# Patient Record
Sex: Female | Born: 1935 | Race: White | Hispanic: No | Smoking: Never smoker
Health system: Southern US, Community
[De-identification: ages and names within clinical notes are randomized; demographics above are authoritative.]

## PROBLEM LIST (undated history)

## (undated) DIAGNOSIS — E079 Disorder of thyroid, unspecified: Secondary | ICD-10-CM

## (undated) DIAGNOSIS — R7303 Prediabetes: Secondary | ICD-10-CM

## (undated) DIAGNOSIS — R29898 Other symptoms and signs involving the musculoskeletal system: Secondary | ICD-10-CM

## (undated) DIAGNOSIS — C9310 Chronic myelomonocytic leukemia not having achieved remission: Secondary | ICD-10-CM

## (undated) DIAGNOSIS — E039 Hypothyroidism, unspecified: Secondary | ICD-10-CM

## (undated) HISTORY — DX: Other symptoms and signs involving the musculoskeletal system: R29.898

## (undated) HISTORY — DX: Chronic myelomonocytic leukemia not having achieved remission: C93.10

---

## 2005-04-30 ENCOUNTER — Ambulatory Visit: Payer: Self-pay | Admitting: Unknown Physician Specialty

## 2005-06-10 ENCOUNTER — Ambulatory Visit: Payer: Self-pay | Admitting: Gastroenterology

## 2006-06-02 ENCOUNTER — Ambulatory Visit: Payer: Self-pay | Admitting: Unknown Physician Specialty

## 2007-06-14 ENCOUNTER — Ambulatory Visit: Payer: Self-pay | Admitting: Unknown Physician Specialty

## 2008-06-20 ENCOUNTER — Ambulatory Visit: Payer: Self-pay | Admitting: Unknown Physician Specialty

## 2009-07-02 ENCOUNTER — Ambulatory Visit: Payer: Self-pay | Admitting: Unknown Physician Specialty

## 2009-07-02 IMAGING — US US CAROTID DUPLEX BILAT
1 series · 17 of 24 positions shown · non-contrast
Comparison: none

REASON FOR EXAM: DIZZINESS  NEAR SYNCOPE
COMMENTS:

[Series 1: us carotid duplex bilat · 17 of 59 slices shown]
[im 1/59]
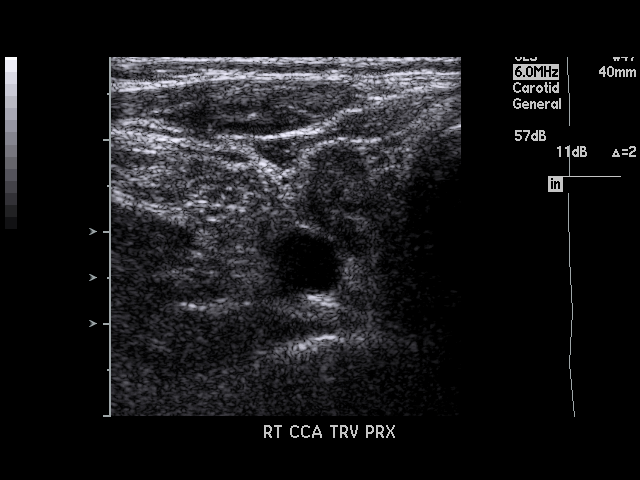
[im 6/59]
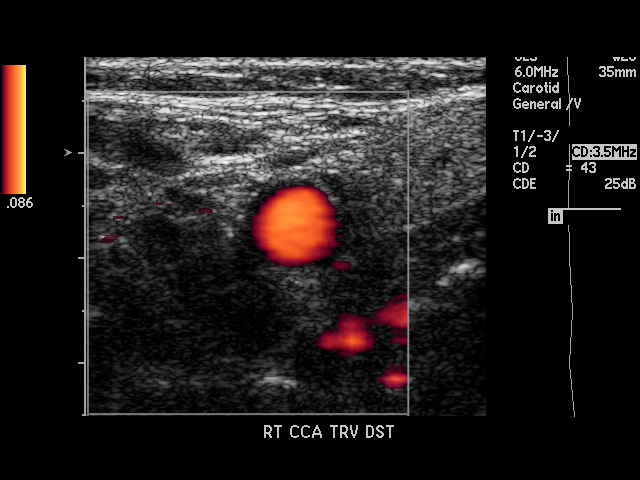
[im 8/59]
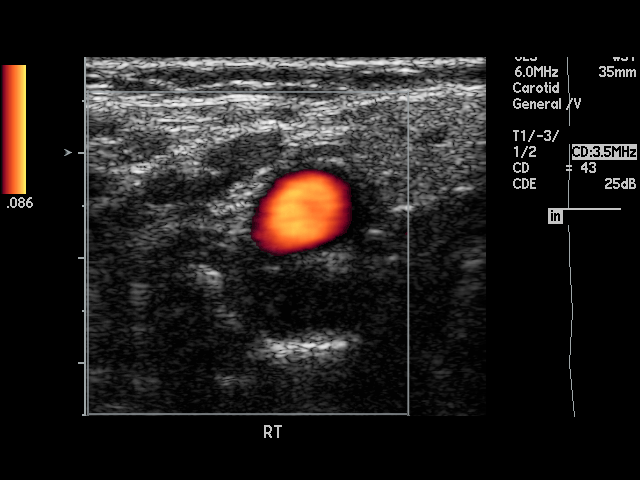
[im 11/59]
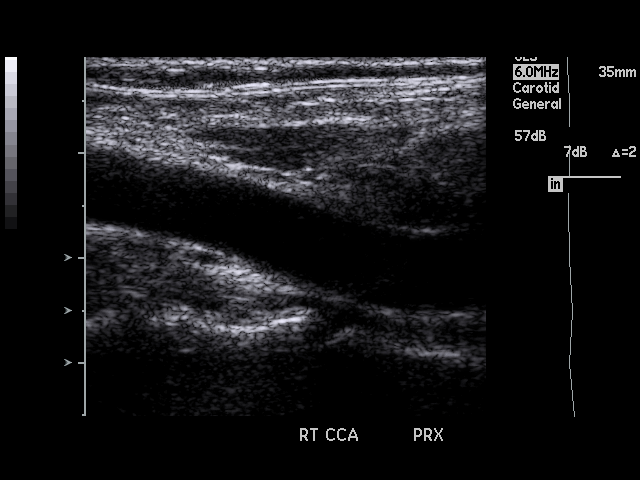
[im 16/59]
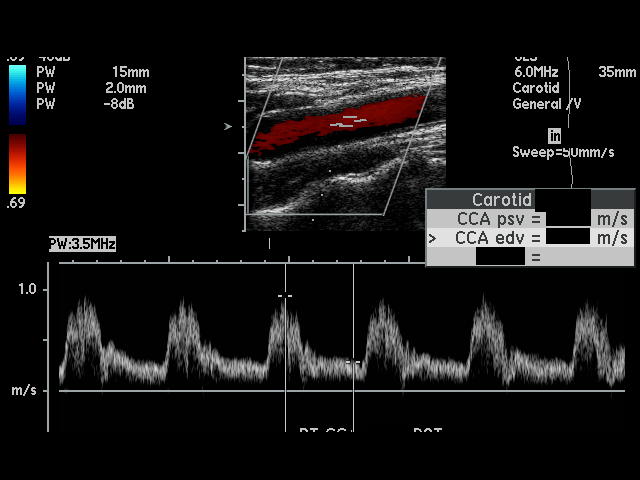
[im 18/59]
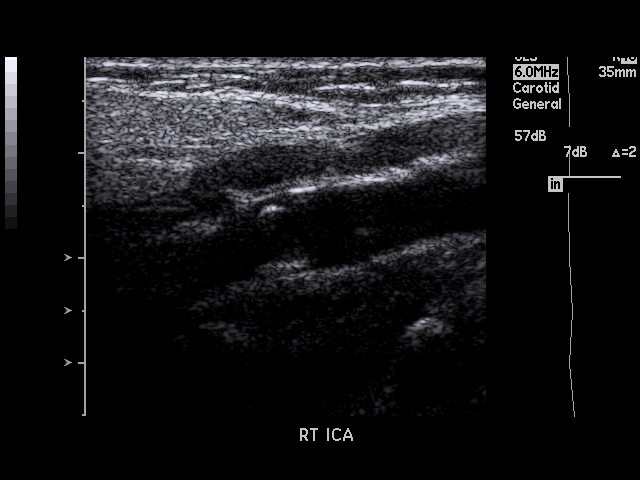
[im 23/59]
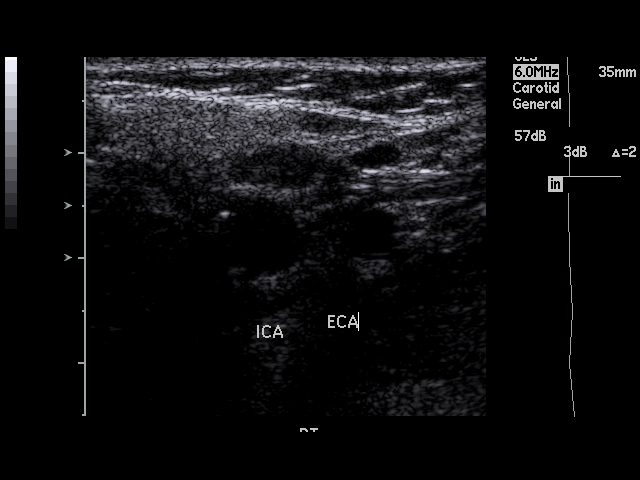
[im 26/59]
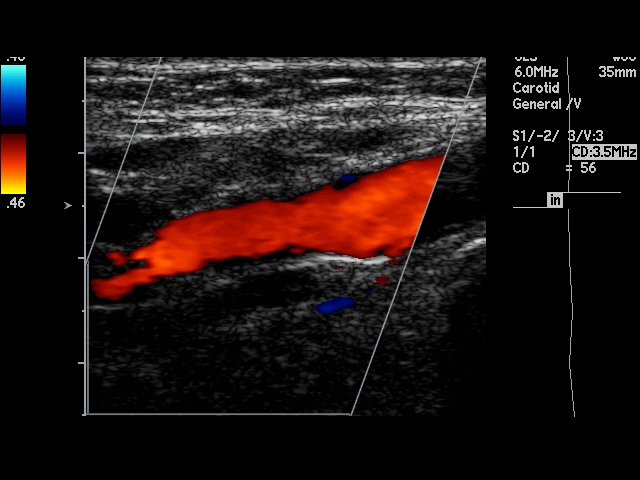
[im 31/59]
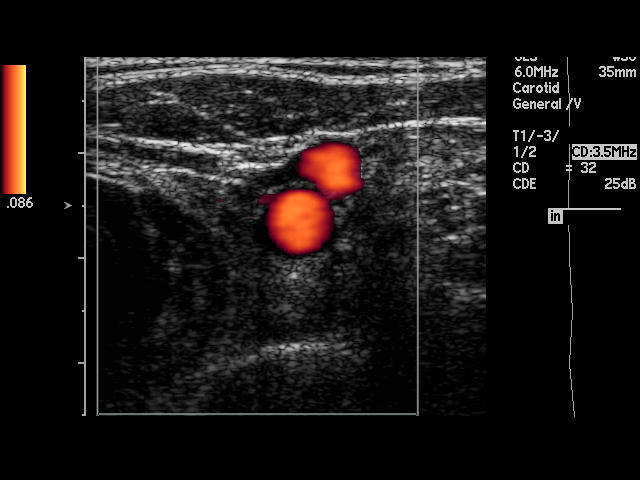
[im 33/59]
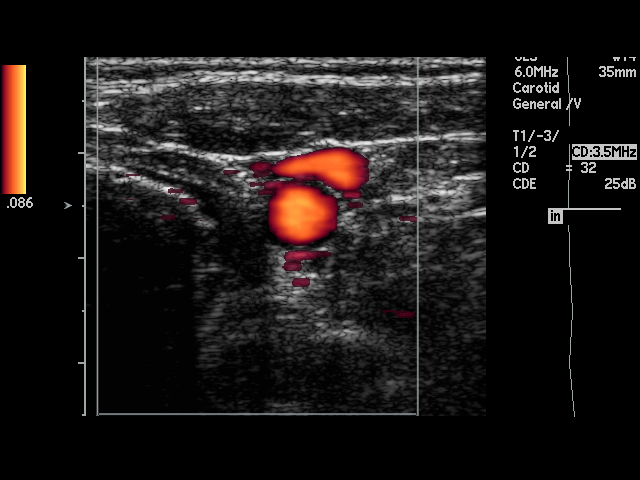
[im 36/59]
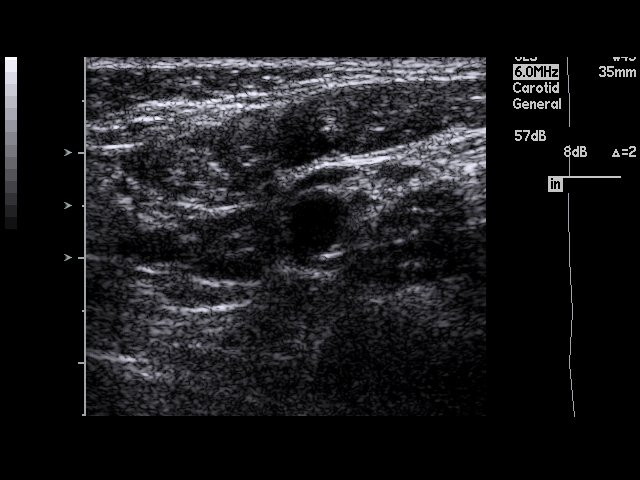
[im 41/59]
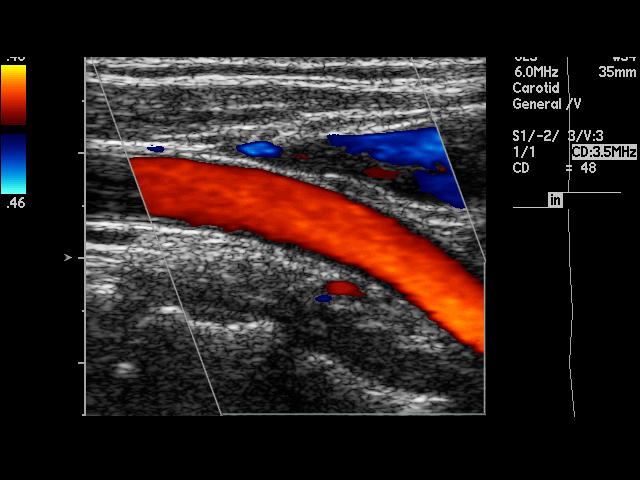
[im 43/59]
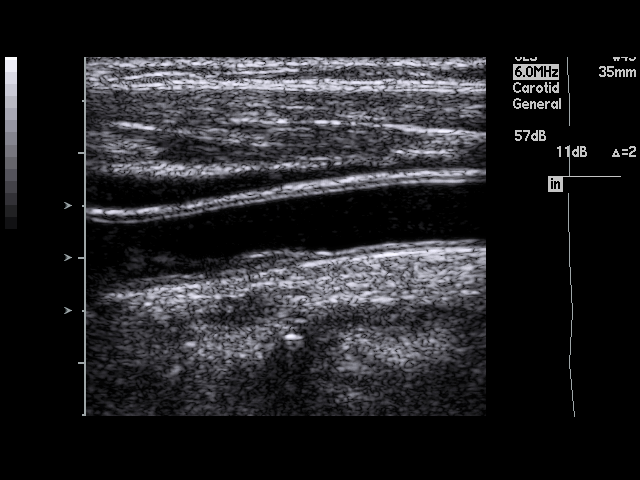
[im 48/59]
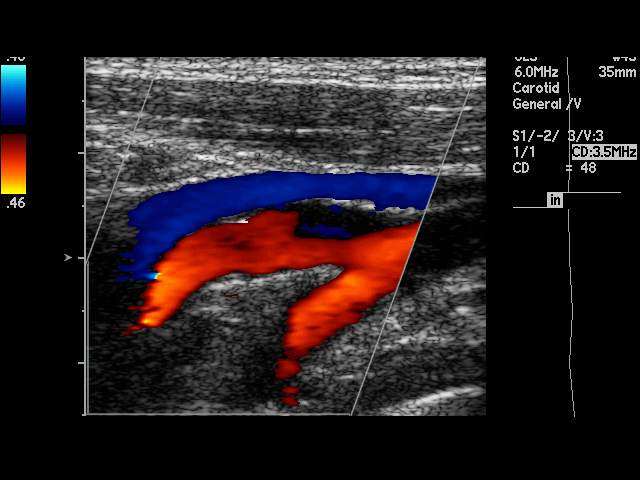
[im 51/59]
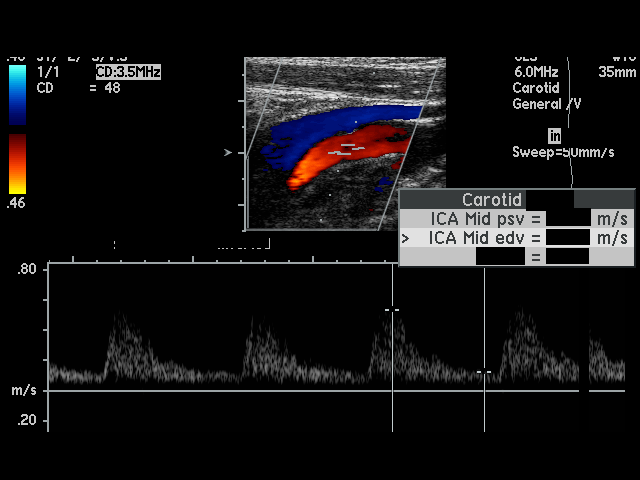
[im 53/59]
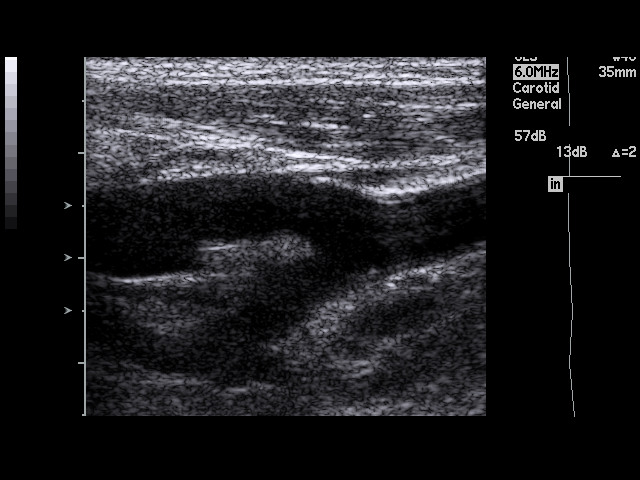
[im 59/59]
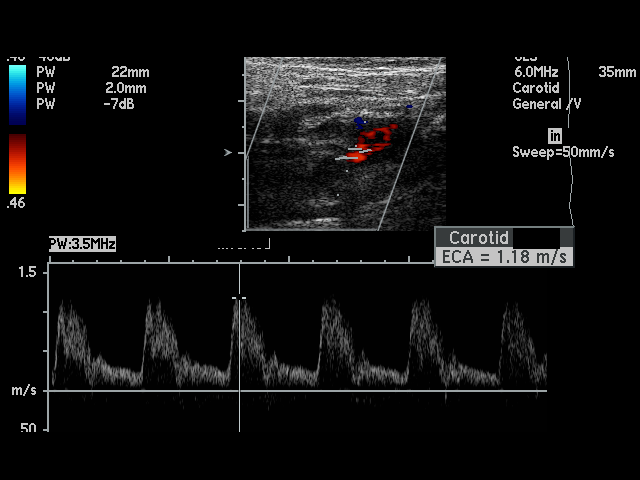

[17 of 24 positions shown; findings below may reference images not displayed]

PROCEDURE:     US  - US CAROTID DOPPLER BILATERAL  - [DATE]  [DATE]

RESULT:     Doppler interrogation of the carotid systems demonstrates the
presence of atherosclerotic plaque in the internal carotid regions
bilaterally and in the left carotid bulb. Visually, there does not appear to
be significant stenosis. The color and SPECTRAL Doppler appearance is within
normal limits. The peak systolic velocities are unremarkable. The internal
to common carotid peak systolic velocity ratio is 1.02 on the right and
on the left. Antegrade flow is seen in both vertebral arteries.
IMPRESSION: 1.     Atherosclerotic disease slightly more prominent on the left. No
evidence of hemodynamically significant stenosis.

## 2009-07-02 IMAGING — CT CT HEAD WITHOUT AND WITH CONTRAST
1 of 2 series · 13 of 30 positions shown, 17 images · IV contrast (agent unspecified)
Comparison: none

REASON FOR EXAM: DIZZINESS  NEAR SYNCOPE
COMMENTS:

PROCEDURE:     CT  - CT HEAD W/WO  - [DATE]  [DATE]
RESULT:
TECHNIQUE: Helical pre- and post contrast 5 mm sections were obtained from
the skull base through the vertex.

[Series 2: without · axial · non-contrast · 0.38mm/px · z∈[+786,+906]mm · 13 of 30 slices shown, 17 images]
[im 3/30  brain]
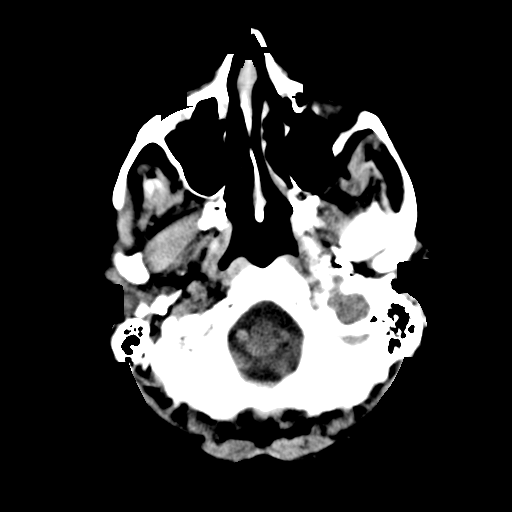
[im 3/30  bone]
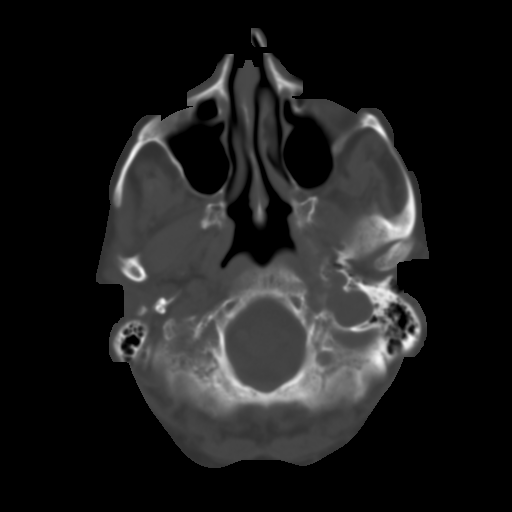
[im 5/30  brain]
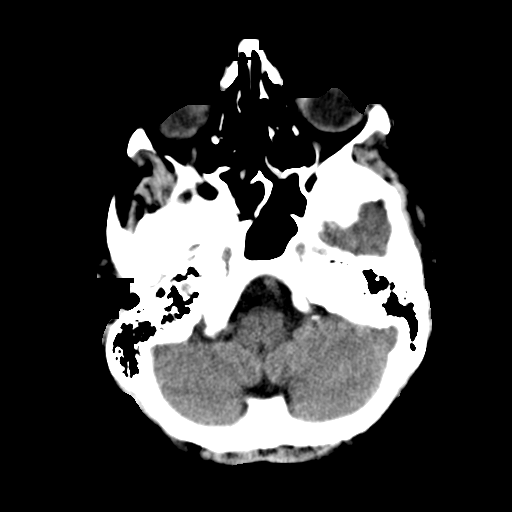
[im 7/30  brain]
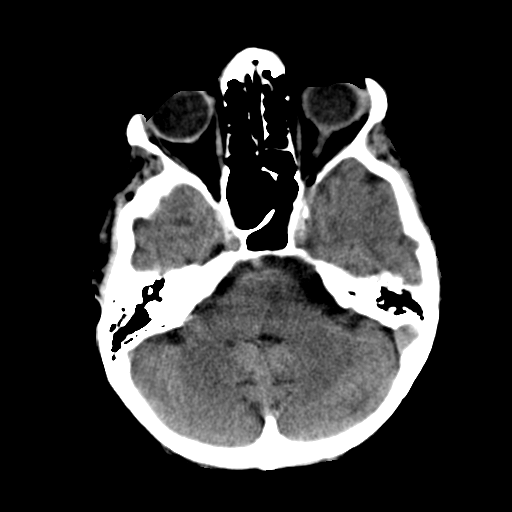
[im 9/30  brain]
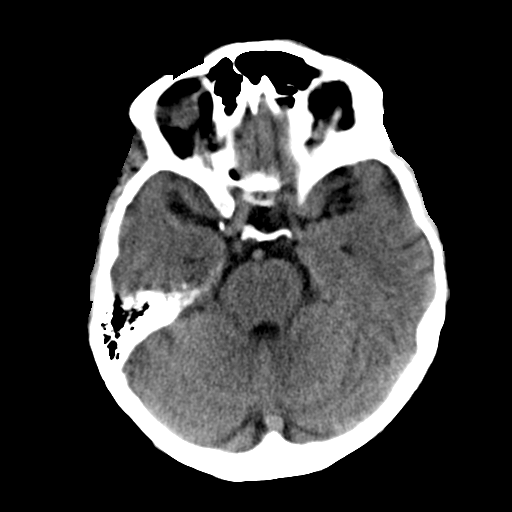
[im 11/30  brain]
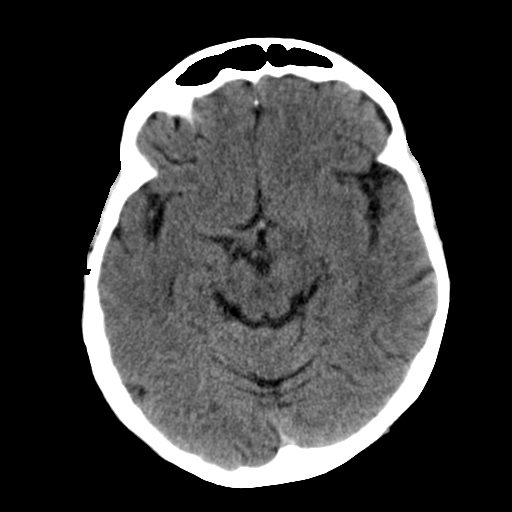
[im 11/30  bone]
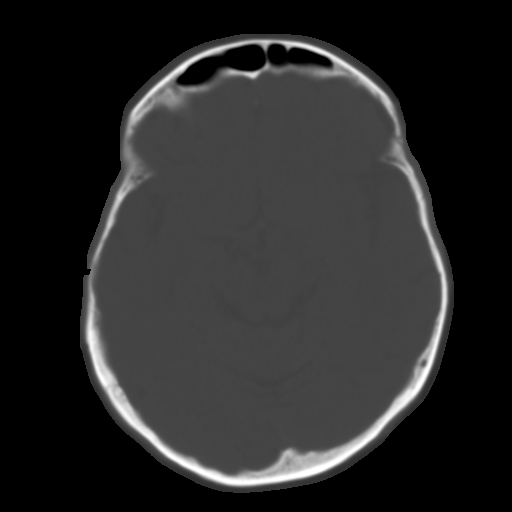
[im 13/30  brain]
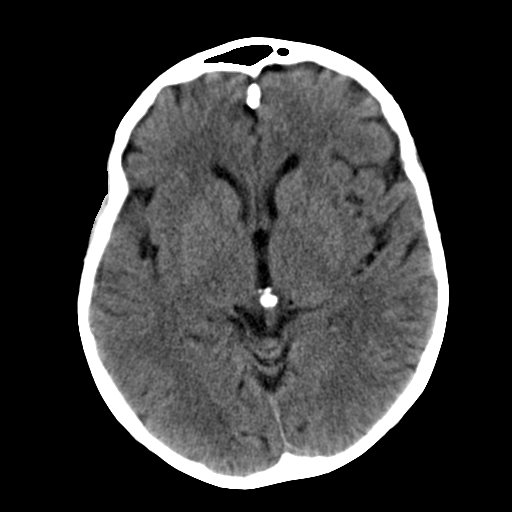
[im 15/30  brain]
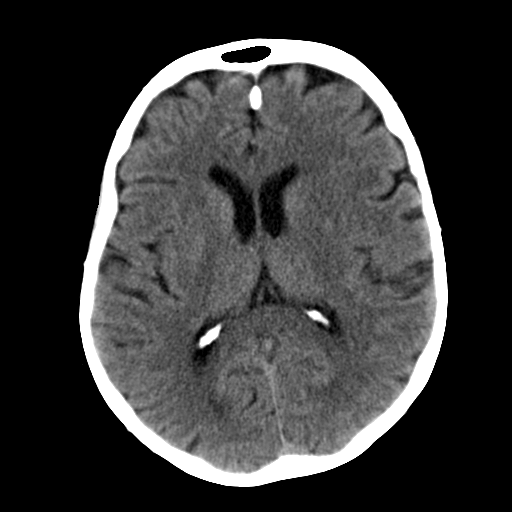
[im 17/30  brain]
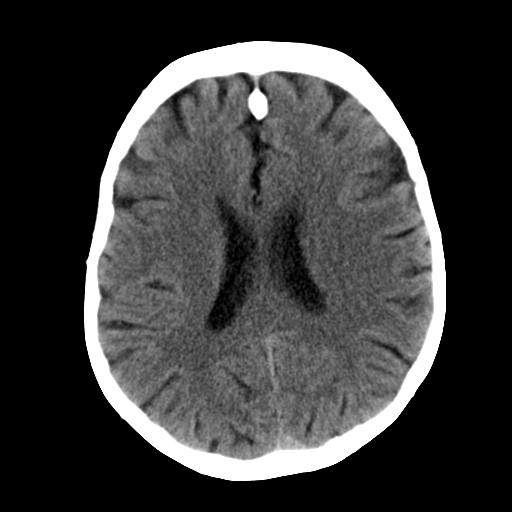
[im 19/30  brain]
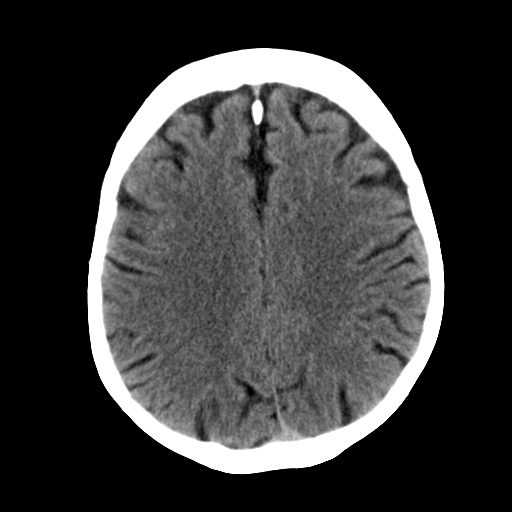
[im 19/30  bone]
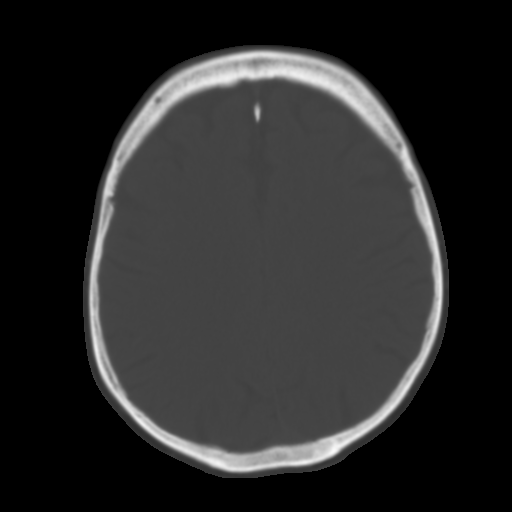
[im 21/30  brain]
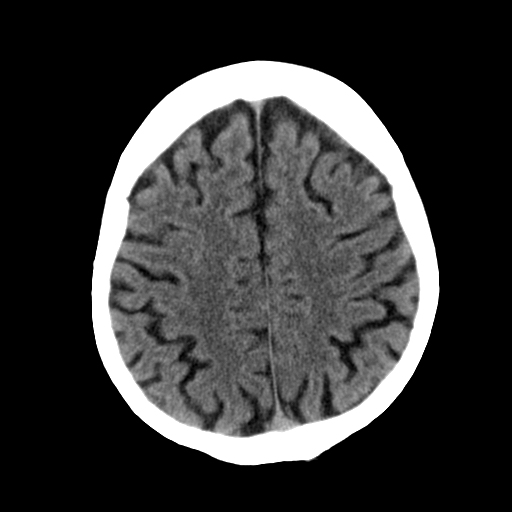
[im 23/30  brain]
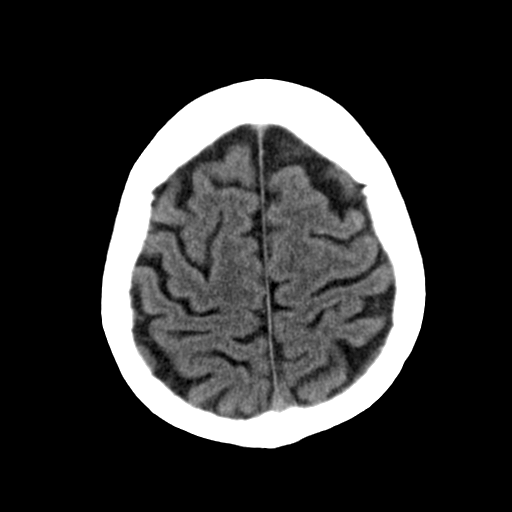
[im 25/30  brain]
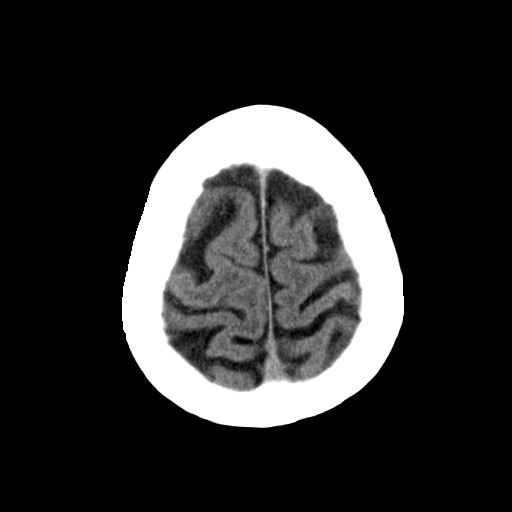
[im 27/30  brain]
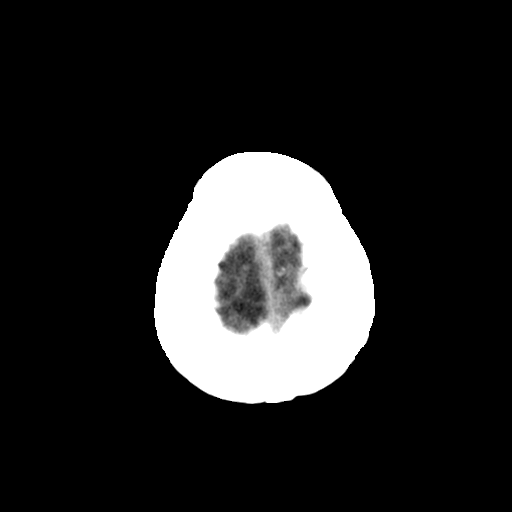
[im 27/30  bone]
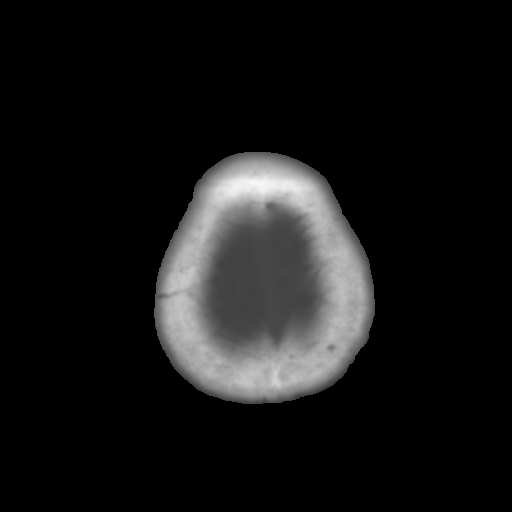

[13 of 30 positions shown; findings below may reference images not displayed]

FINDINGS: There is no evidence of intra-axial nor extra-axial fluid
collections nor evidence of acute hemorrhage. The ventricles and cisterns
are patent. There is no evidence reflecting mass effect. There is mild
diffuse cortical atrophy. There is no evidence of abnormal parenchymal
enhancement nor enhancing masses or nodules. The osseous structures
demonstrate no evidence of a depressed skull fracture. Limited evaluation of
cerebellum and pons is unremarkable.
IMPRESSION: 1. No evidence of focal or acute intracranial abnormalities.
2. It there is persistent clinical concern further evaluation with MRI is
recommended.

## 2009-08-13 ENCOUNTER — Ambulatory Visit: Payer: Self-pay | Admitting: Unknown Physician Specialty

## 2010-08-16 ENCOUNTER — Ambulatory Visit: Payer: Self-pay | Admitting: Unknown Physician Specialty

## 2010-08-19 ENCOUNTER — Ambulatory Visit: Payer: Self-pay | Admitting: Gastroenterology

## 2011-09-08 ENCOUNTER — Ambulatory Visit: Payer: Self-pay | Admitting: Unknown Physician Specialty

## 2012-08-24 ENCOUNTER — Ambulatory Visit: Payer: Self-pay | Admitting: Internal Medicine

## 2012-09-15 ENCOUNTER — Ambulatory Visit: Payer: Self-pay | Admitting: Family Medicine

## 2015-07-26 DIAGNOSIS — R7303 Prediabetes: Secondary | ICD-10-CM | POA: Insufficient documentation

## 2017-09-21 DIAGNOSIS — Z862 Personal history of diseases of the blood and blood-forming organs and certain disorders involving the immune mechanism: Secondary | ICD-10-CM | POA: Insufficient documentation

## 2017-12-03 ENCOUNTER — Emergency Department
Admission: EM | Admit: 2017-12-03 | Discharge: 2017-12-03 | Disposition: A | Discharge status: Home / Self Care | Payer: Medicare HMO | Attending: Emergency Medicine | Admitting: Emergency Medicine

## 2017-12-03 ENCOUNTER — Emergency Department: Payer: Medicare HMO

## 2017-12-03 ENCOUNTER — Other Ambulatory Visit: Payer: Self-pay

## 2017-12-03 DIAGNOSIS — R079 Chest pain, unspecified: Secondary | ICD-10-CM | POA: Diagnosis not present

## 2017-12-03 DIAGNOSIS — Z79899 Other long term (current) drug therapy: Secondary | ICD-10-CM | POA: Diagnosis not present

## 2017-12-03 HISTORY — DX: Disorder of thyroid, unspecified: E07.9

## 2017-12-03 LAB — BASIC METABOLIC PANEL
Anion gap: 9 (ref 5–15)
BUN: 20 mg/dL (ref 6–20)
CHLORIDE: 106 mmol/L (ref 101–111)
CO2: 25 mmol/L (ref 22–32)
CREATININE: 0.74 mg/dL (ref 0.44–1.00)
Calcium: 9.1 mg/dL (ref 8.9–10.3)
GFR calc Af Amer: >60 mL/min (ref >60)
GFR calc non Af Amer: >60 mL/min (ref >60)
GLUCOSE: 98 mg/dL (ref 65–99)
Potassium: 4.1 mmol/L (ref 3.5–5.1)
SODIUM: 140 mmol/L (ref 135–145)

## 2017-12-03 LAB — CBC
HEMATOCRIT: 36.3 % (ref 35.0–47.0)
HEMOGLOBIN: 12.2 g/dL (ref 12.0–16.0)
MCH: 34 pg (ref 26.0–34.0)
MCHC: 33.7 g/dL (ref 32.0–36.0)
MCV: 101.2 fL — AB (ref 80.0–100.0)
Platelets: 215 10*3/uL (ref 150–440)
RBC: 3.59 MIL/uL — ABNORMAL LOW (ref 3.80–5.20)
RDW: 14.1 % (ref 11.5–14.5)
WBC: 4.8 10*3/uL (ref 3.6–11.0)

## 2017-12-03 LAB — TROPONIN I: Troponin I: <0.03 ng/mL (ref <0.03)

## 2017-12-03 IMAGING — CR DG CHEST 2V
1 series · 2 of 2 positions shown · non-contrast
Comparison: None.

CLINICAL DATA: Chest pain.

EXAM:
CHEST - 2 VIEW

[Series 1: dg chest 2 view · 0.14mm/px · 2 of 2 slices shown]
[im 1/2]
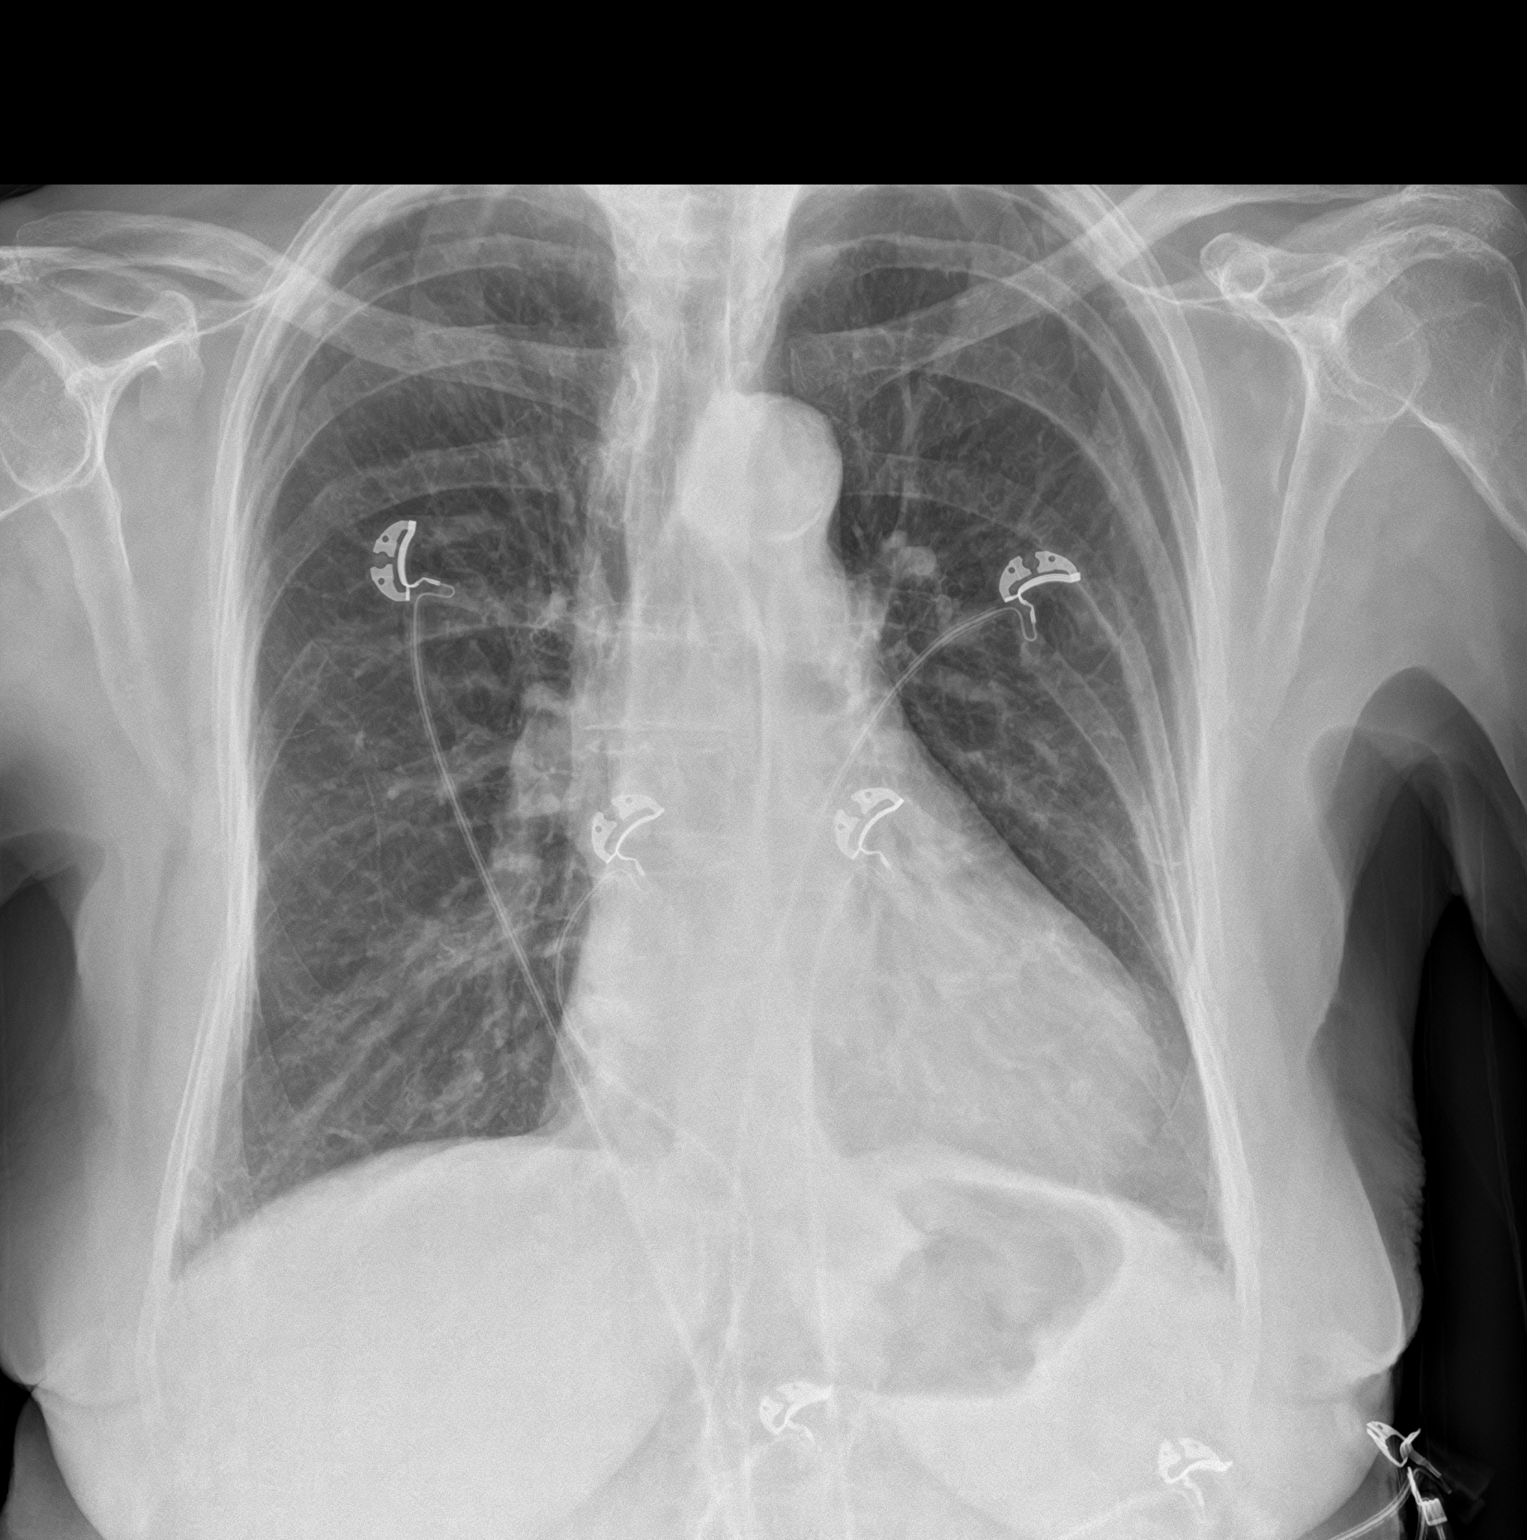
[im 2/2]
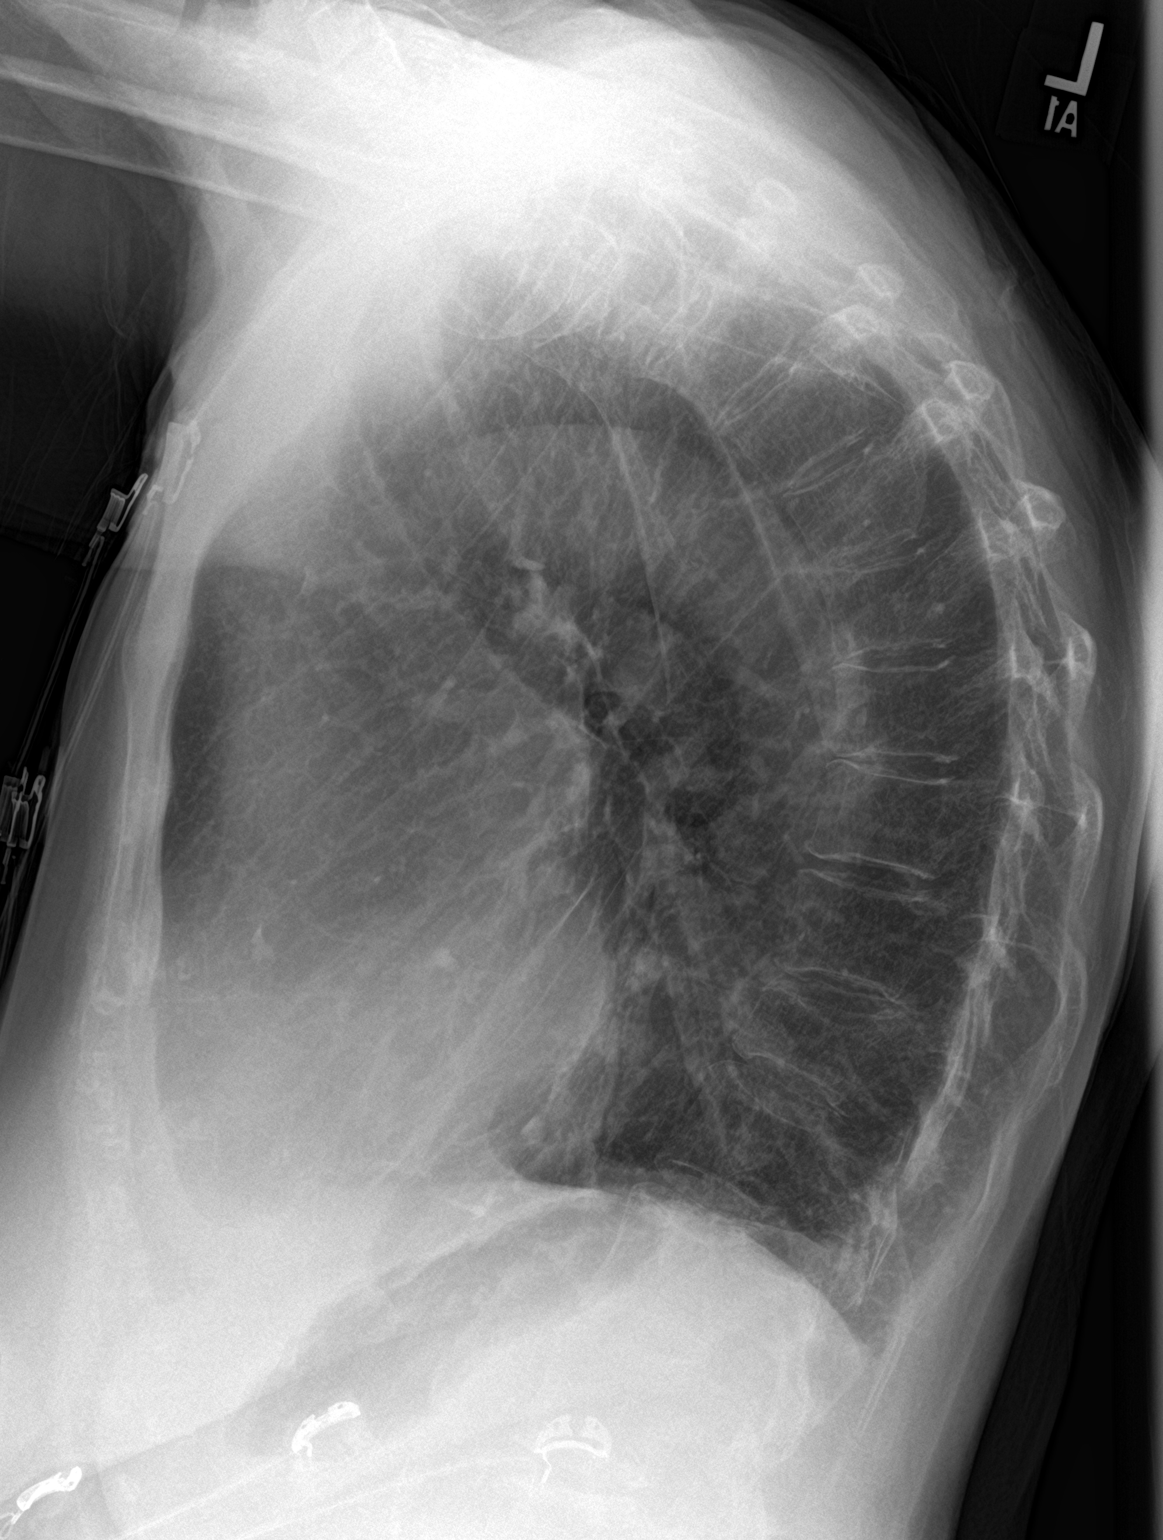

[2 of 2 positions shown; findings below may reference images not displayed]

FINDINGS: Stable cardiomediastinal silhouette. Atherosclerosis of thoracic
aorta is noted. No pneumothorax or pleural effusion is noted. No
acute pulmonary disease is noted. Bony thorax is unremarkable.
Hyperexpansion of the lungs is noted.
IMPRESSION: Hyperexpansion of the lungs is noted suggesting chronic obstructive
pulmonary disease.

Aortic Atherosclerosis ([6E]-[6E]).

## 2017-12-03 NOTE — ED Notes (Signed)
Pt came to nurses station and asked when EDP will be in. RN explained not long but cant give exact time. Pt stated, "I will give him 5 more mins then I am leaving!" RN acknowledged, and explained EDP in another pt room at this time and will let him know.

## 2017-12-03 NOTE — ED Triage Notes (Signed)
Pt c/o "slight" chest pain under the left breast that started yesterday.denies SOB/N/V.Marland Kitchen

## 2017-12-03 NOTE — ED Notes (Signed)
Pt refusing gown and EKG. EDP aware at this time.

## 2017-12-03 NOTE — ED Provider Notes (Signed)
[facility] Emergency Department Provider Note  Time seen: 10:06 AM  I have reviewed the triage vital signs and the nursing notes.   HISTORY  Chief Complaint Chest Pain    HPI Amanda Rose is a 84 y.o. female with a past medical history of hypothyroidism, presents to the emergency department for lower chest/upper abdominal discomfort.  According to the patient since yesterday afternoon she has been experiencing a very mild she rates as a 1/10 discomfort in her upper abdomen/lower chest.  Patient states she has been experiencing some mild left hip pain over the past 2 weeks which was diagnosed as bursitis.  States she has been taking ibuprofen over the past 1 week which she believes is causing her discomfort due to irritation.  She denies any shortness of breath, trouble breathing, nausea or diaphoresis.  Rates the discomfort as mild pressure 1/10.  States she told her family about it who made her come to the emergency department.  Patient states she did not feel she needed to come.   Past Medical History:  Diagnosis Date  . Thyroid disease     There are no active problems to display for this patient.   History reviewed. No pertinent surgical history.  Prior to Admission medications   Medication Sig Start Date End Date Taking? Authorizing Provider  Ferrous Fumarate (HEMOCYTE - 106 MG FE) 324 (106 Fe) MG TABS tablet Take 1 tablet by mouth 3 (three) times a week.   Yes [provider]  lactobacillus acidophilus (BACID) TABS tablet Take 1 capsule by mouth 3 (three) times a week.   Yes [provider]  levothyroxine (SYNTHROID, LEVOTHROID) 88 MCG tablet Take 1 tablet by mouth every morning. 10/19/17  Yes [provider]  magnesium oxide (MAG-OX) 400 MG tablet Take 1 tablet by mouth 3 (three) times a week.   Yes [provider]  meloxicam (MOBIC) 15 MG tablet Take 15 mg by mouth daily with breakfast. 11/19/17  Yes [provider]  Multiple Vitamins-Minerals (CENTRUM SILVER 50+WOMEN) TABS Take 1 tablet by mouth 3 (three) times a week.   Yes [provider]  Multiple Vitamins-Minerals (PRESERVISION AREDS 2+MULTI VIT) CAPS Take 1 capsule by mouth 3 (three) times a week.   Yes [provider]    No Known Allergies  No family history on file.  Social History Social History   Tobacco Use  . Smoking status: Never Smoker  . Smokeless tobacco: Never Used  Substance Use Topics  . Alcohol use: Not on file  . Drug use: Not on file    Review of Systems Constitutional: Negative for fever. Eyes: Negative for visual complaints ENT: Negative for recent illness/congestion Cardiovascular: Positive for lower chest discomfort/upper abdominal discomfort Respiratory: Negative for shortness of breath. Gastrointestinal: Mild upper abdominal/left upper quadrant discomfort/lower chest pain. Genitourinary: Negative for urinary compaints Musculoskeletal: Negative for lower leg pain or swelling.  Patient states she had mild discomfort in the left hip but that is gone since taking ibuprofen for the last week. Skin: Negative for skin complaints  Neurological: Negative for headache All other ROS negative  ____________________________________________   PHYSICAL EXAM:  VITAL SIGNS: ED Triage Vitals  Enc Vitals Group     BP 12/03/17 0850 (!) 145/62     Pulse Rate 12/03/17 0850 91     Resp 12/03/17 0850 16     Temp 12/03/17 0850 97.6 F (36.4 C)     Temp Source 12/03/17 0850 Oral  SpO2 12/03/17 0850 97 %     Weight 12/03/17 0851 130 lb (59 kg)     Height 12/03/17 0851 5\' 1"  (1.549 m)     Head Circumference --      Peak Flow --      Pain Score 12/03/17 0851 2     Pain Loc --      Pain Edu? --      Excl. in [place]? --    Constitutional: Alert and oriented. Well appearing and in no distress. Eyes: Normal exam ENT   Head: Normocephalic and atraumatic.   Mouth/Throat: Mucous membranes  are moist. Cardiovascular: Normal rate, regular rhythm. No murmur Respiratory: Normal respiratory effort without tachypnea nor retractions. Breath sounds are clear  Gastrointestinal: Soft and nontender. No distention.  Musculoskeletal: Nontender with normal range of motion in all extremities. No lower extremity tenderness or edema.  Neurologic:  Normal speech and language. No gross focal neurologic deficits Skin:  Skin is warm, dry and intact.  Psychiatric: Mood and affect are normal.  ____________________________________________    EKG  EKG reviewed and interpreted by myself shows normal sinus rhythm 87 bpm with a narrow QRS, normal axis, normal intervals, no concerning ST changes.  Reassuring EKG.  ____________________________________________    RADIOLOGY  X-ray negative for acute abnormality  ____________________________________________   INITIAL IMPRESSION / ASSESSMENT AND PLAN / ED COURSE  Pertinent labs & imaging results that were available during my care of the patient were reviewed by me and considered in my medical decision making (see chart for details).  Patient presents to the emergency department for lower chest/upper abdominal discomfort which she ranks as a 1/10.  Differential would include ACS, pancreatitis, gastritis, chest wall discomfort.  On exam the patient appears extremely well, no distress, overall normal physical exam, normal heart sounds and lung sounds, nontender abdomen.  EKG is extremely reassuring.  We will obtain an x-ray as well as labs including cardiac enzymes.  Patient denies any discomfort currently, states she does not feel like she needs to be at the emergency department.  X-ray negative for acute abnormality.  EKG is reassuring.  Labs including cardiac enzymes are reassuring.  Patient has no complaints or discomfort currently.  We will discharge patient with cardiology follow-up.  Patient agreeable to this plan of care.  I discussed my normal  chest pain return precautions to which the patient and family are agreeable.  ____________________________________________   FINAL CLINICAL IMPRESSION(S) / ED DIAGNOSES  Chest pain    Harvest Dark, MD 12/03/17 1104

## 2017-12-03 NOTE — ED Notes (Signed)
Pt refused lab draw as well as any monitoring. Pt states, "I'm fine! Do not need to see doctor at all." RN will notify EDP and continue to monitor.

## 2017-12-03 NOTE — Discharge Instructions (Addendum)
You have been seen in the emergency department today for chest pain. Your workup has shown normal results. As we discussed please follow-up with your primary care physician in the next 1-2 days for recheck. Return to the emergency department for any further chest pain, trouble breathing, or any other symptom personally concerning to yourself. °

## 2017-12-07 DIAGNOSIS — I6523 Occlusion and stenosis of bilateral carotid arteries: Secondary | ICD-10-CM | POA: Insufficient documentation

## 2019-04-18 DIAGNOSIS — H35033 Hypertensive retinopathy, bilateral: Secondary | ICD-10-CM | POA: Insufficient documentation

## 2019-04-18 DIAGNOSIS — H353131 Nonexudative age-related macular degeneration, bilateral, early dry stage: Secondary | ICD-10-CM | POA: Insufficient documentation

## 2020-11-01 DIAGNOSIS — N1831 Chronic kidney disease, stage 3a: Secondary | ICD-10-CM | POA: Insufficient documentation

## 2021-01-07 ENCOUNTER — Other Ambulatory Visit: Payer: Self-pay | Admitting: Family Medicine

## 2021-01-07 DIAGNOSIS — R140 Abdominal distension (gaseous): Secondary | ICD-10-CM

## 2021-01-07 DIAGNOSIS — R1030 Lower abdominal pain, unspecified: Secondary | ICD-10-CM

## 2021-01-07 DIAGNOSIS — R0781 Pleurodynia: Secondary | ICD-10-CM

## 2021-01-08 ENCOUNTER — Ambulatory Visit
Admission: RE | Admit: 2021-01-08 | Discharge: 2021-01-08 | Disposition: A | Discharge status: Home / Self Care | Payer: Medicare HMO | Source: Ambulatory Visit | Attending: Family Medicine | Admitting: Family Medicine

## 2021-01-08 ENCOUNTER — Other Ambulatory Visit: Payer: Self-pay

## 2021-01-08 DIAGNOSIS — R0781 Pleurodynia: Secondary | ICD-10-CM | POA: Diagnosis present

## 2021-01-08 DIAGNOSIS — R140 Abdominal distension (gaseous): Secondary | ICD-10-CM | POA: Insufficient documentation

## 2021-01-08 DIAGNOSIS — R1030 Lower abdominal pain, unspecified: Secondary | ICD-10-CM | POA: Diagnosis present

## 2021-01-08 IMAGING — CT CT ABD-PELV W/ CM
2 of 5 series · 16 of 46 positions shown, 18 images · IV contrast (omnipaque)
Comparison: None.

CLINICAL DATA: Left flank and back pain for 1 week

EXAM:
CT ABDOMEN AND PELVIS WITH CONTRAST
TECHNIQUE: Multidetector CT imaging of the abdomen and pelvis was performed
using the standard protocol following bolus administration of
intravenous contrast.
CONTRAST:  85mL OMNIPAQUE IOHEXOL 300 MG/ML  SOLN

[Series 2: abd pelvis 5.00 · axial · 0.66mm/px · z∈[-1528,-1188]mm · 13 of 78 slices shown, 15 images]
[im 5/78  soft-tissue]
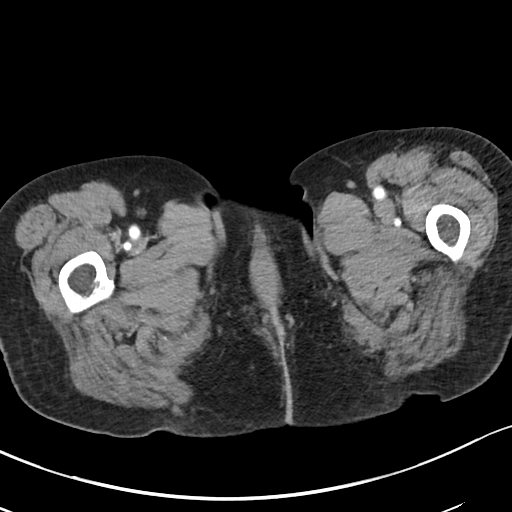
[im 5/78  bone]
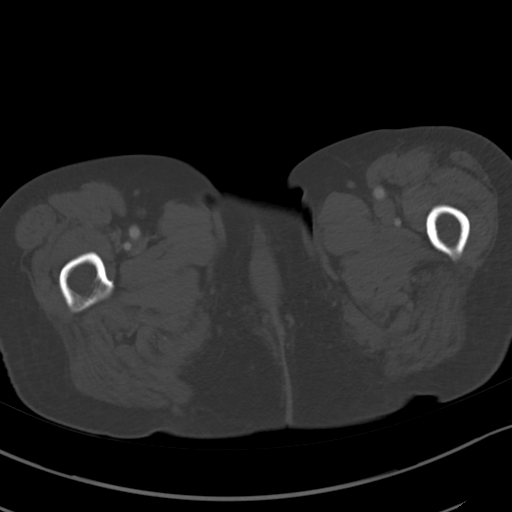
[im 13/78  soft-tissue]
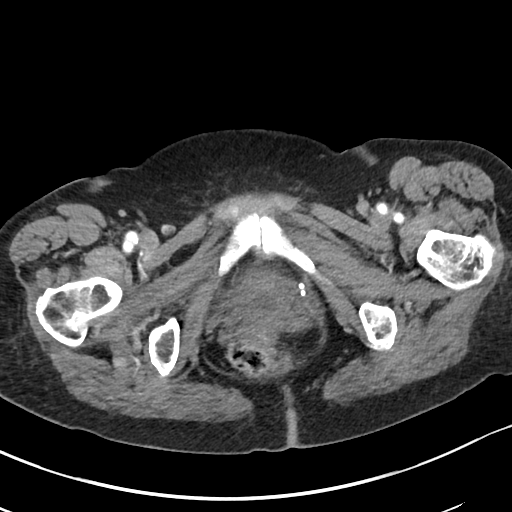
[im 17/78  soft-tissue]
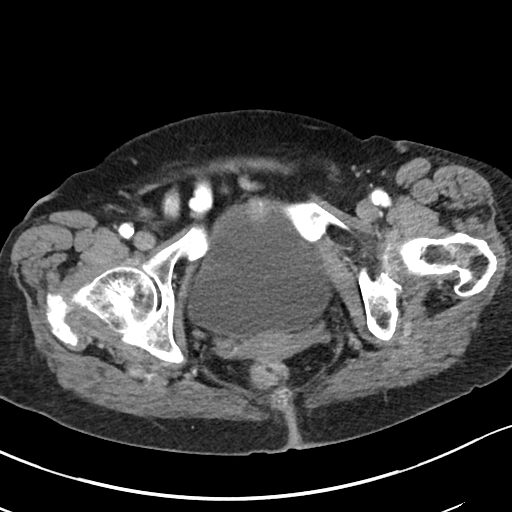
[im 21/78  soft-tissue]
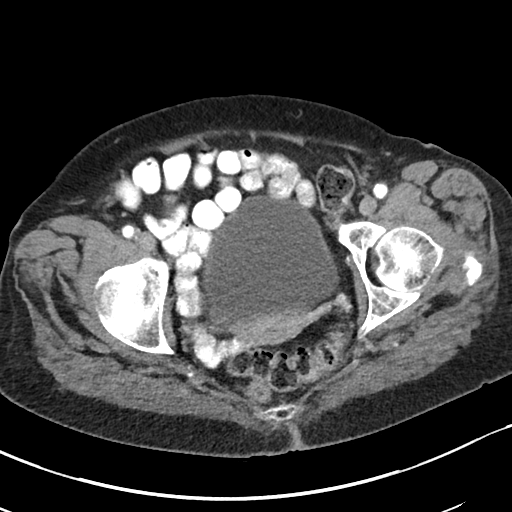
[im 29/78  soft-tissue]
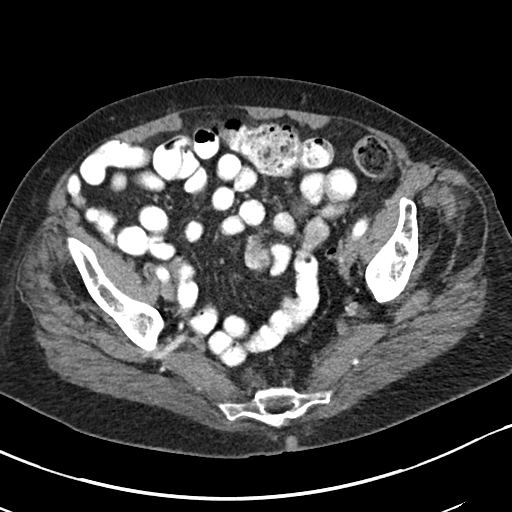
[im 33/78  soft-tissue]
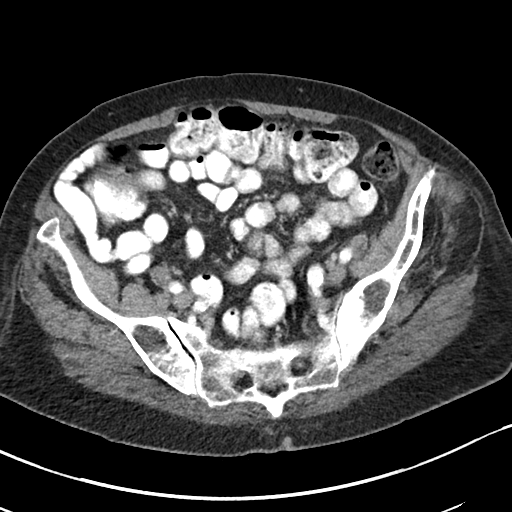
[im 41/78  soft-tissue]
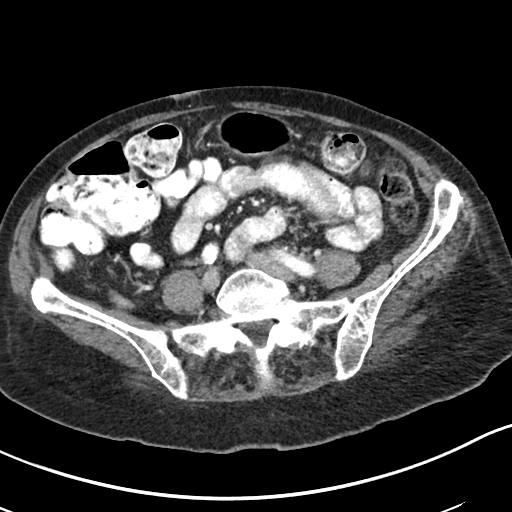
[im 45/78  soft-tissue]
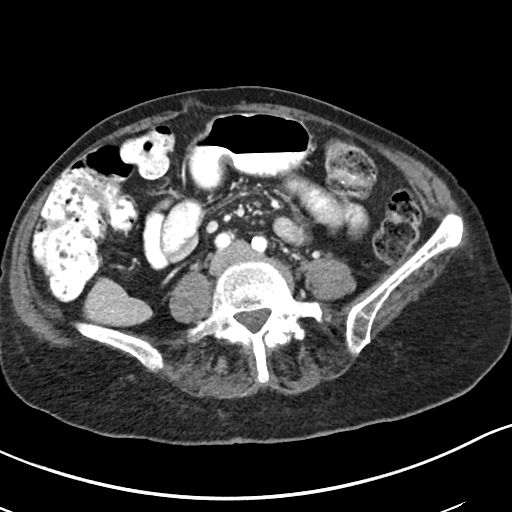
[im 49/78  soft-tissue]
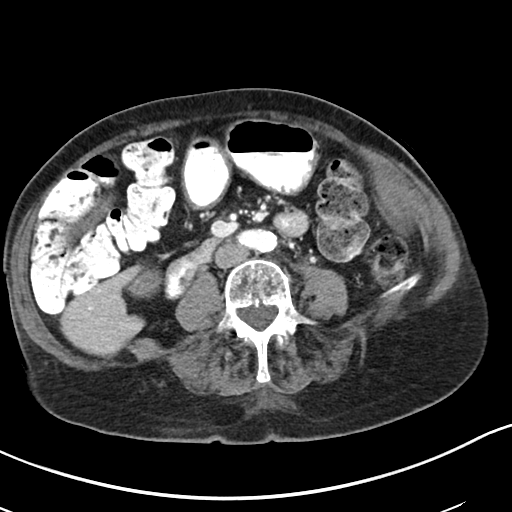
[im 49/78  bone]
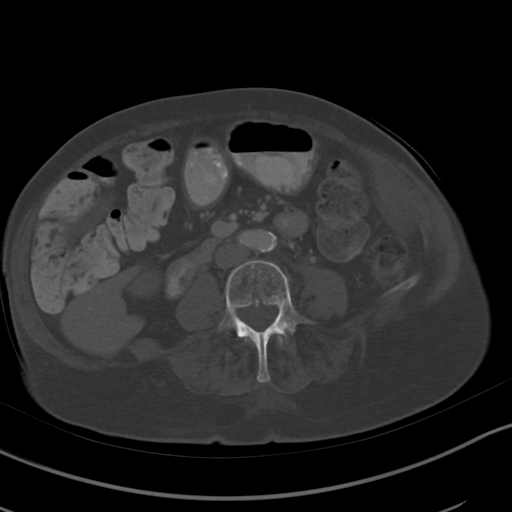
[im 57/78  soft-tissue]
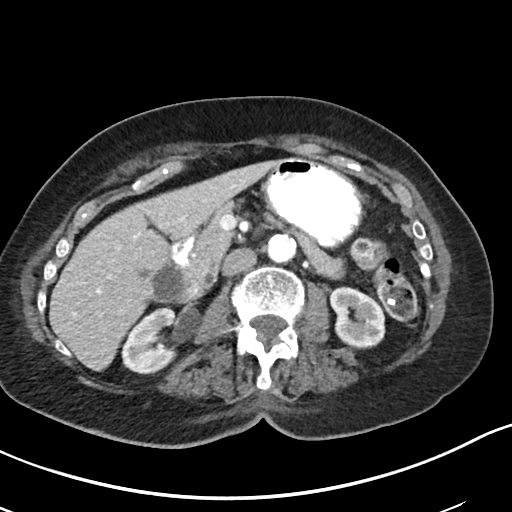
[im 61/78  soft-tissue]
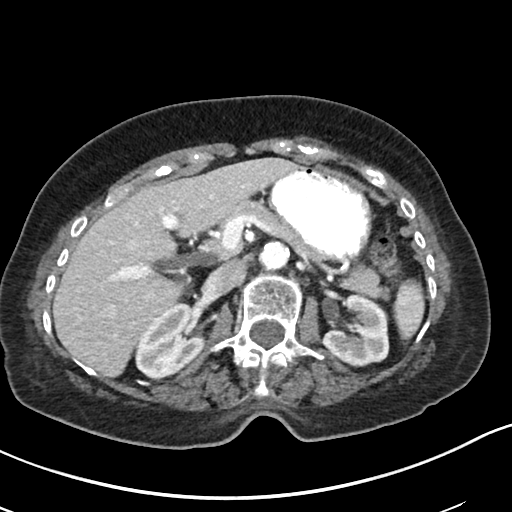
[im 65/78  soft-tissue]
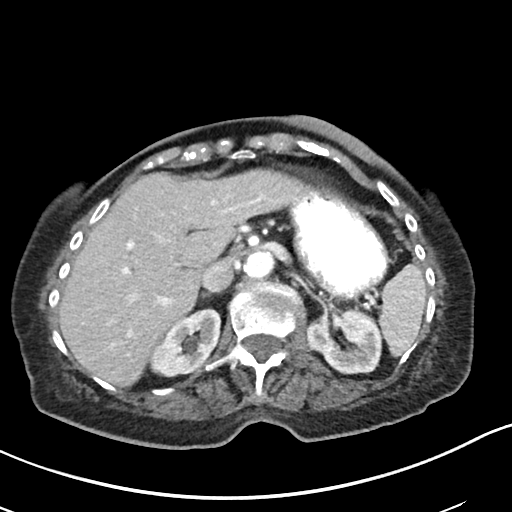
[im 73/78  soft-tissue]
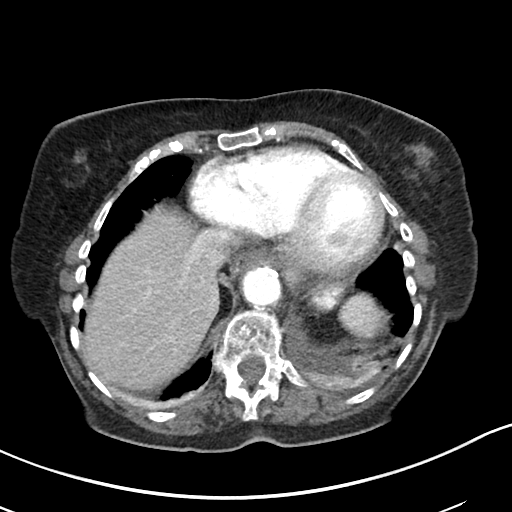

[Series 4: coronals abd pelvis 2.00 cor · coronal · 0.66mm/px · 3 of 130 slices shown]
[im 44/130  soft-tissue]
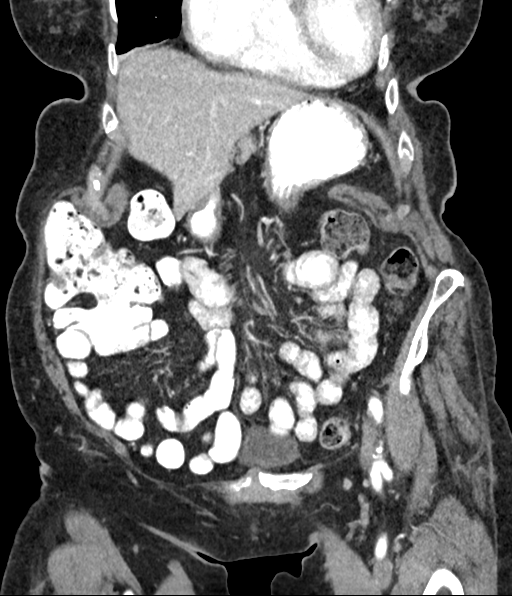
[im 58/130  soft-tissue]
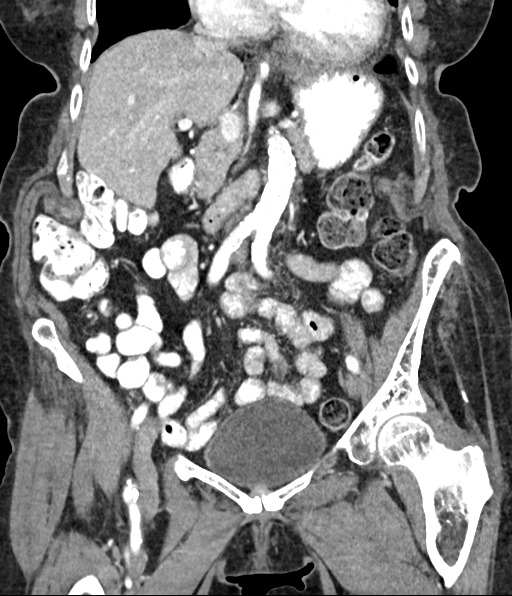
[im 72/130  soft-tissue]
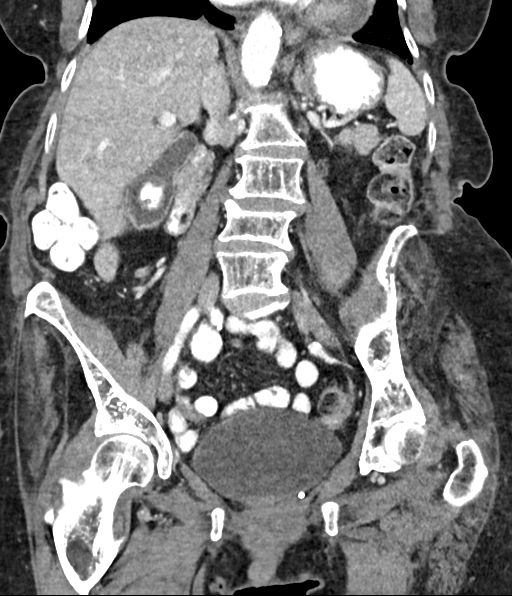

[16 of 46 positions shown; findings below may reference images not displayed]

FINDINGS: Lower chest: Pleural thickening on the left in the lower lobe is
noted with minimal atelectatic change. This is of uncertain
chronicity.

Hepatobiliary: Mild fatty infiltration of the liver is noted.
Gallbladder is well distended with a single dependent gallstone
within.

Pancreas: Unremarkable. No pancreatic ductal dilatation or
surrounding inflammatory changes.

Spleen: Normal in size without focal abnormality.

Adrenals/Urinary Tract: Adrenal glands are within normal limits.
Kidneys demonstrate a normal enhancement pattern bilaterally with
normal excretion bilaterally. No renal calculi are seen. Small
cortical cyst is noted in the lower pole on the right. No
obstructive changes are seen. The ureters are within normal limits.
The bladder is well distended.

Stomach/Bowel: The appendix is not well visualized. No obstructive
or inflammatory changes of the colon are seen. A few scattered
diverticula are noted. Mild retained fecal material in the colon is
noted consistent with a degree of constipation. Stomach and small
bowel appear within normal limits.

Vascular/Lymphatic: Aortic atherosclerosis. No enlarged abdominal or
pelvic lymph nodes.

Reproductive: Uterus and bilateral adnexa are unremarkable.

Other: No abdominal wall hernia or abnormality. No abdominopelvic
ascites.

Musculoskeletal: Schmorl's node is noted in the superior endplate of
L4. No acute compression deformity is noted. Generalized osteopenia
is seen.
IMPRESSION: Mild atelectatic changes in the left base with associated small
effusion and pleural thickening are seen. These changes are felt to
be chronic in nature.

No acute abnormality to correspond with the given clinical history
is noted.

Fatty liver and cholelithiasis.

Diverticulosis without diverticulitis.

## 2021-01-08 MED ORDER — IOHEXOL 300 MG/ML  SOLN
85.0000 mL | Freq: Once | INTRAMUSCULAR | Status: AC | PRN
  Administered 2021-01-08: 85 mL via INTRAVENOUS

## 2021-01-17 ENCOUNTER — Ambulatory Visit: Payer: Medicare HMO

## 2021-03-18 ENCOUNTER — Other Ambulatory Visit: Payer: Self-pay | Admitting: Physician Assistant

## 2021-03-18 DIAGNOSIS — S32040A Wedge compression fracture of fourth lumbar vertebra, initial encounter for closed fracture: Secondary | ICD-10-CM

## 2021-03-20 ENCOUNTER — Ambulatory Visit
Admission: RE | Admit: 2021-03-20 | Discharge: 2021-03-20 | Disposition: A | Discharge status: Home / Self Care | Payer: Medicare HMO | Source: Ambulatory Visit | Attending: Physician Assistant | Admitting: Physician Assistant

## 2021-03-20 ENCOUNTER — Other Ambulatory Visit: Payer: Self-pay

## 2021-03-20 DIAGNOSIS — S32040A Wedge compression fracture of fourth lumbar vertebra, initial encounter for closed fracture: Secondary | ICD-10-CM | POA: Insufficient documentation

## 2021-03-20 IMAGING — MR MR LUMBAR SPINE W/O CM
5 series · 31 of 48 positions shown · non-contrast
Comparison: [DATE] abdominal CT

CLINICAL DATA: Closed wedge fracture of L4

EXAM:
MRI LUMBAR SPINE WITHOUT CONTRAST
TECHNIQUE: Multiplanar, multisequence MR imaging of the lumbar spine was
performed. No intravenous contrast was administered.

[Series 5: T2 · sagittal · 4.0mm · 0.88mm/px · 6 of 18 slices shown (1 of 2)]
[im 1/18]
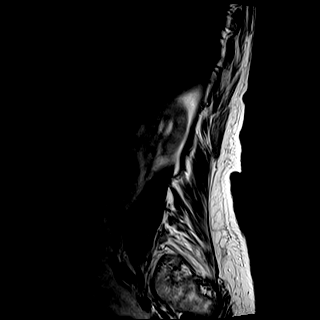
[im 4/18]
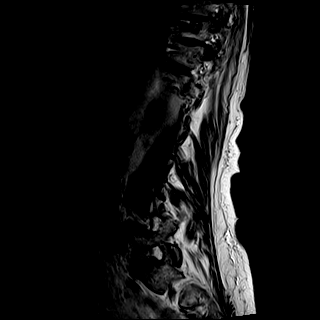
[im 7/18]
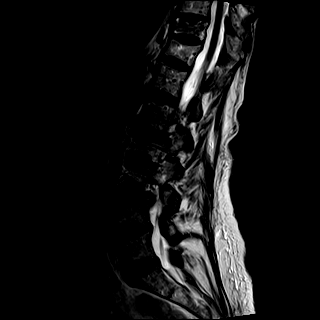
[im 11/18]
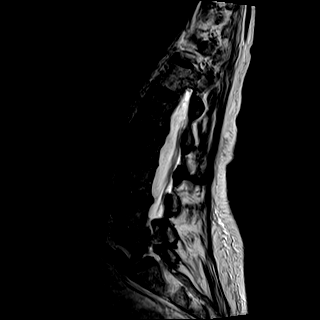
[im 14/18]
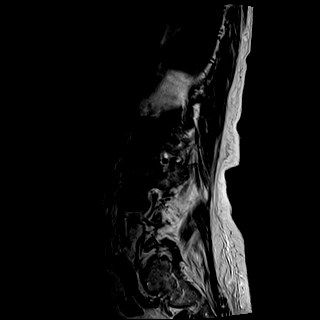
[im 18/18]
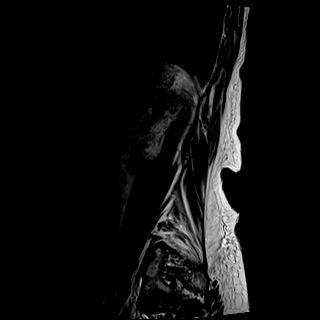

[Series 6: T1 · sagittal · 4.0mm · 0.88mm/px · 7 of 18 slices shown (1 of 2)]
[im 1/18]
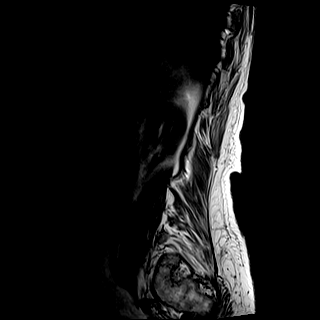
[im 3/18]
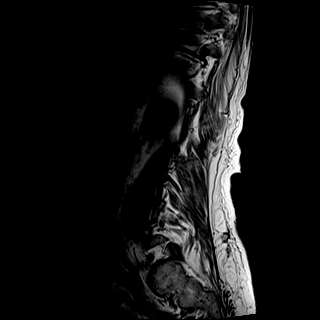
[im 6/18]
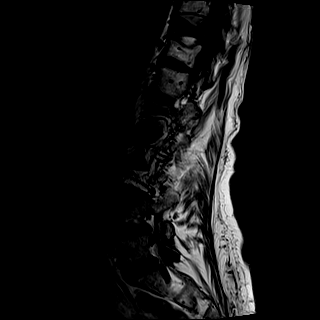
[im 9/18]
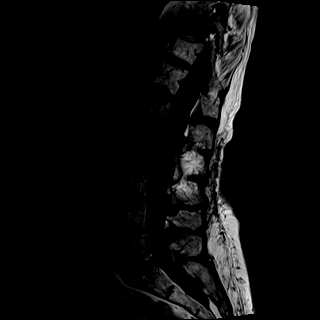
[im 12/18]
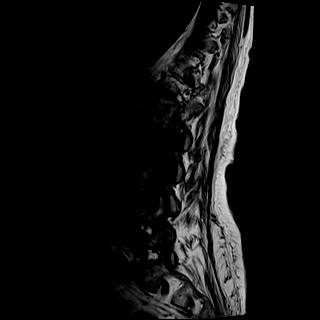
[im 15/18]
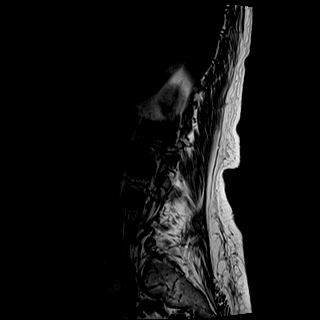
[im 18/18]
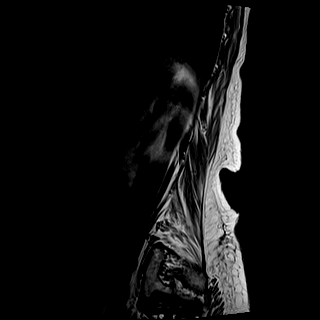

[Series 7: STIR · sagittal · 4.0mm · 0.44mm/px · 2 of 18 slices shown]
[im 1/18]
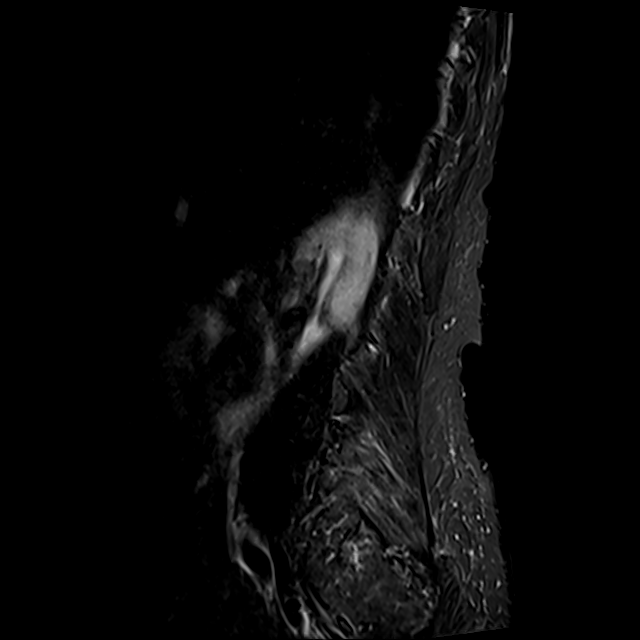
[im 3/18]
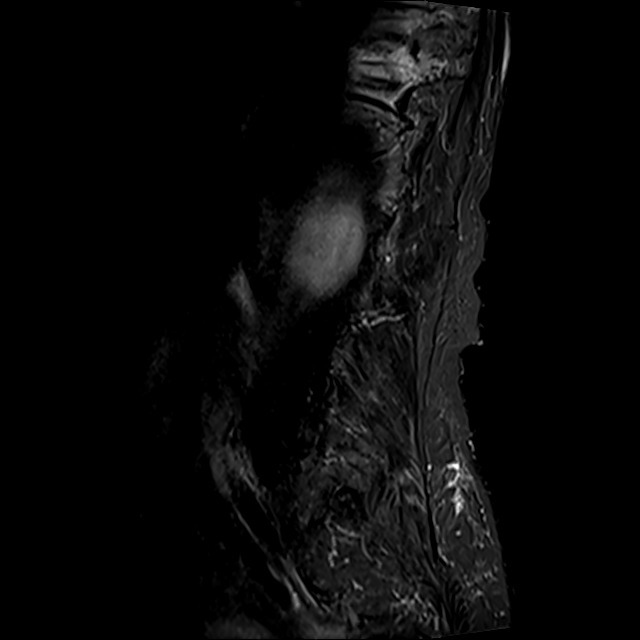

[Series 8: T2 · axial · 4.0mm · 0.78mm/px · z∈[-78,+119]mm · 8 of 36 slices shown (2 of 2)]
[im 1/36]
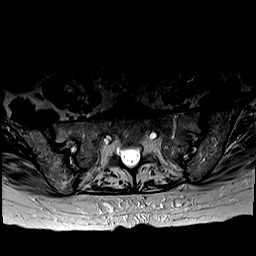
[im 6/36]
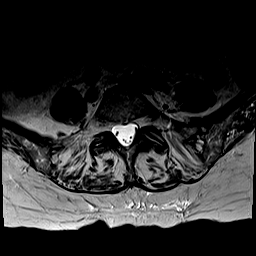
[im 11/36]
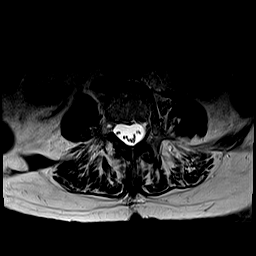
[im 17/36]
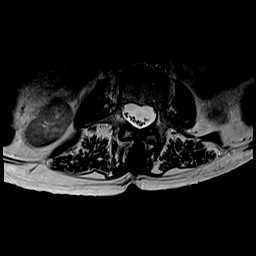
[im 19/36]
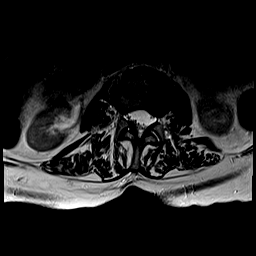
[im 25/36]
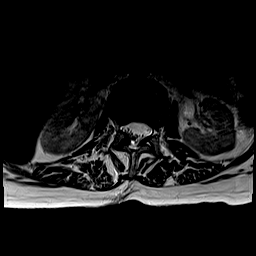
[im 30/36]
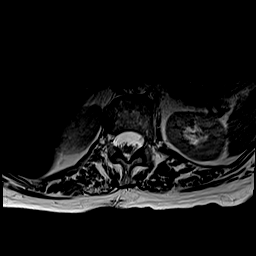
[im 36/36]
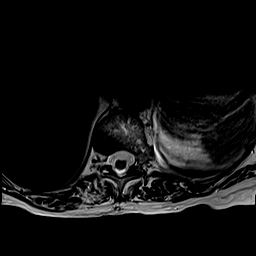

[Series 9: T1 · axial · 4.0mm · 0.39mm/px · z∈[-78,+119]mm · 8 of 36 slices shown (2 of 2)]
[im 1/36]
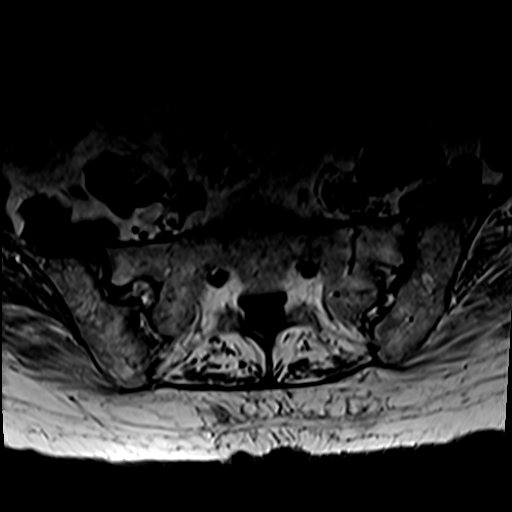
[im 6/36]
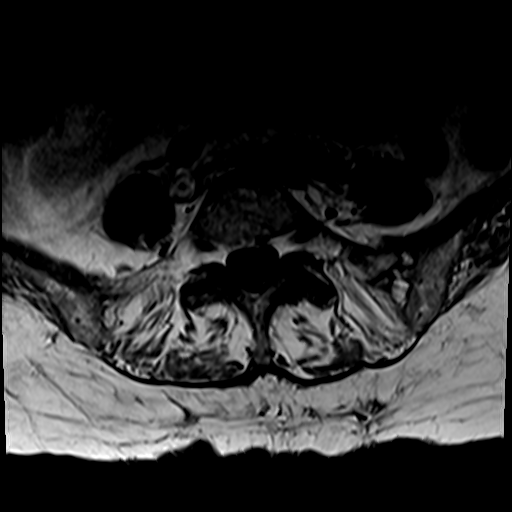
[im 11/36]
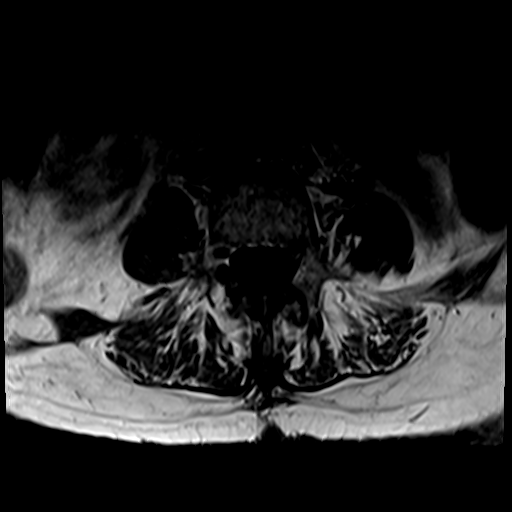
[im 17/36]
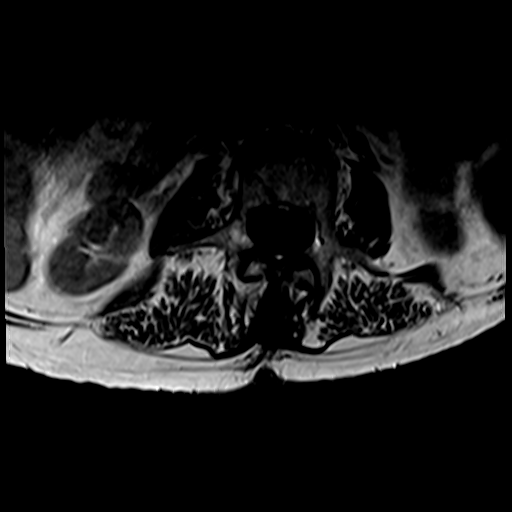
[im 19/36]
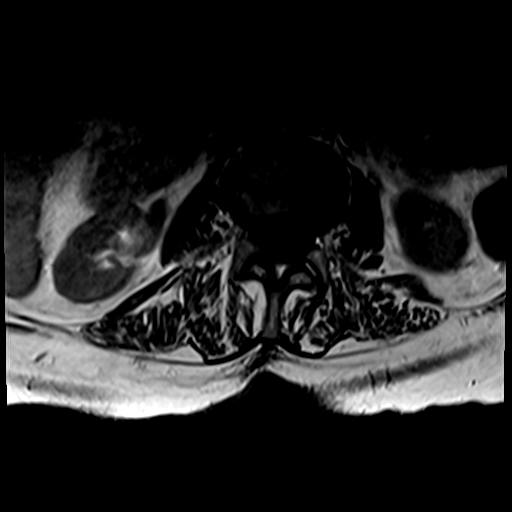
[im 25/36]
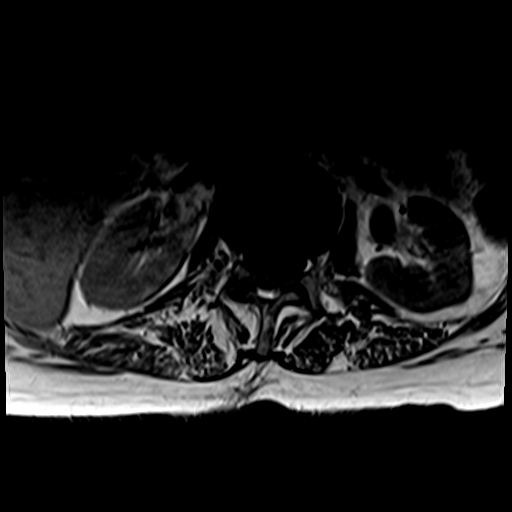
[im 30/36]
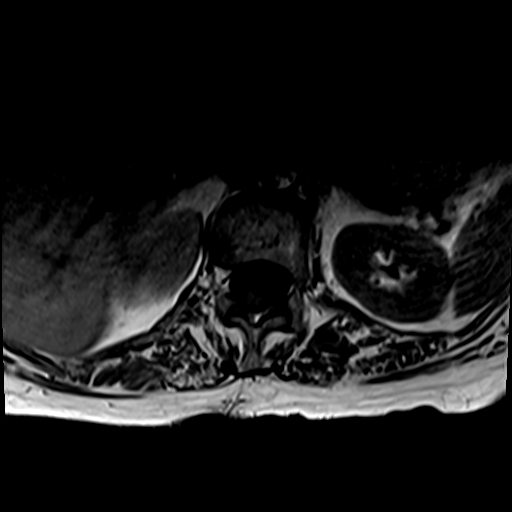
[im 36/36]
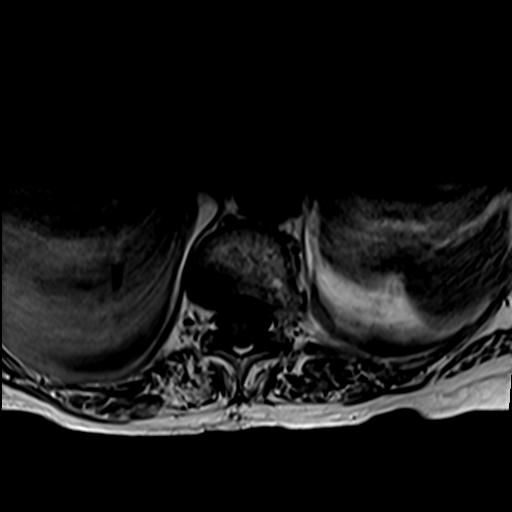

[31 of 48 positions shown; findings below may reference images not displayed]

FINDINGS: Segmentation:  5 lumbar type vertebrae

Alignment:  Mild levoscoliosis

Vertebrae: Marrow edematous signal throughout the L2 body, which is
compressed with a horizontal hypointense fracture plane following
the inferior endplate.

Compression fracture of T10 as well, with diffuse marrow
heterogeneity. Height loss at T10 measures 50% when compared to the
next closest normal vertebra (T12). Remote compression fractures of
T11 and L4. No lytic findings in the L2 body by CT. No evidence of
bone lesion elsewhere.

Conus medullaris and cauda equina: Conus extends to the L1-2 level.
Conus and cauda equina appear normal.

Paraspinal and other soft tissues: Fatty atrophy of intrinsic back
muscles.

Disc levels:

Generalized disc space narrowing and bulging. Generalized
degenerative facet spurring. The canal and foramina are diffusely
patent.
IMPRESSION: 1. Acute to subacute compression fractures at T10 and L2. The T10
vertebra shows greatest height loss at 50%.
2. Prominent degree of marrow involvement, although no visible soft
tissue mass or destructive changes to strongly suggest pathologic
fracture. Consider follow-up to document normalization, especially
if there is malignancy history.
3. Remote compression fractures of T11 and L4.

## 2021-04-03 ENCOUNTER — Other Ambulatory Visit: Payer: Self-pay | Admitting: Orthopedic Surgery

## 2021-04-04 ENCOUNTER — Other Ambulatory Visit: Payer: Self-pay

## 2021-04-04 ENCOUNTER — Other Ambulatory Visit
Admission: RE | Admit: 2021-04-04 | Discharge: 2021-04-04 | Disposition: A | Discharge status: Home / Self Care | Payer: Medicare HMO | Source: Ambulatory Visit | Attending: Orthopedic Surgery | Admitting: Orthopedic Surgery

## 2021-04-04 ENCOUNTER — Encounter
Admission: RE | Admit: 2021-04-04 | Discharge: 2021-04-04 | Disposition: A | Discharge status: Home / Self Care | Payer: Medicare HMO | Source: Ambulatory Visit | Attending: Orthopedic Surgery | Admitting: Orthopedic Surgery

## 2021-04-04 DIAGNOSIS — Z01818 Encounter for other preprocedural examination: Secondary | ICD-10-CM | POA: Diagnosis not present

## 2021-04-04 HISTORY — DX: Prediabetes: R73.03

## 2021-04-04 HISTORY — DX: Hypothyroidism, unspecified: E03.9

## 2021-04-04 LAB — CBC
HCT: 29.4 % — ABNORMAL LOW (ref 36.0–46.0)
Hemoglobin: 10 g/dL — ABNORMAL LOW (ref 12.0–15.0)
MCH: 36.4 pg — ABNORMAL HIGH (ref 26.0–34.0)
MCHC: 34 g/dL (ref 30.0–36.0)
MCV: 106.9 fL — ABNORMAL HIGH (ref 80.0–100.0)
Platelets: 164 10*3/uL (ref 150–400)
RBC: 2.75 MIL/uL — ABNORMAL LOW (ref 3.87–5.11)
RDW: 14.3 % (ref 11.5–15.5)
WBC: 11.2 10*3/uL — ABNORMAL HIGH (ref 4.0–10.5)
nRBC: 0 % (ref 0.0–0.2)

## 2021-04-04 NOTE — Patient Instructions (Signed)
Your procedure is scheduled on: 04/05/21 Report to the Registration Desk on the 1st floor of the [facility]. To find out your arrival time, please call [phone] between 1PM - 3PM on: 04/04/21   REMEMBER: Instructions that are not followed completely may result in serious medical risk, up to and including death; or upon the discretion of your surgeon and anesthesiologist your surgery may need to be rescheduled.  Do not eat food after midnight the night before surgery.  No gum chewing, lozengers or hard candies.  You may however, drink CLEAR liquids up to 2 hours before you are scheduled to arrive for your surgery. Do not drink anything within 2 hours of your scheduled arrival time.  Clear liquids include: - water  - apple juice without pulp - gatorade (not RED, PURPLE, OR BLUE) - black coffee or tea (Do NOT add milk or creamers to the coffee or tea) Do NOT drink anything that is not on this list.  TAKE THESE MEDICATIONS THE MORNING OF SURGERY WITH A SIP OF WATER:  - levothyroxine (SYNTHROID) 100 MCG tablet  One week prior to surgery: Stop Anti-inflammatories (NSAIDS) such as Advil, Aleve, Ibuprofen, Motrin, Naproxen, Naprosyn and Aspirin based products such as Excedrin, Goodys Powder, BC Powder.  Stop ANY OVER THE COUNTER supplements until after surgery.  You may however, continue to take Tylenol if needed for pain up until the day of surgery.  No Alcohol for 24 hours before or after surgery.  No Smoking including e-cigarettes for 24 hours prior to surgery.  No chewable tobacco products for at least 6 hours prior to surgery.  No nicotine patches on the day of surgery.  Do not use any "recreational" drugs for at least a week prior to your surgery.  Please be advised that the combination of cocaine and anesthesia may have negative outcomes, up to and including death. If you test positive for cocaine, your surgery will be cancelled.  On the morning of surgery brush your  teeth with toothpaste and water, you may rinse your mouth with mouthwash if you wish. Do not swallow any toothpaste or mouthwash.  Do not wear jewelry, make-up, hairpins, clips or nail polish.  Do not wear lotions, powders, or perfumes.   Do not shave body from the neck down 48 hours prior to surgery just in case you cut yourself which could leave a site for infection.  Also, freshly shaved skin may become irritated if using the CHG soap.  Contact lenses, hearing aids and dentures may not be worn into surgery.  Do not bring valuables to the hospital. [facility] is not responsible for any missing/lost belongings or valuables.   Notify your doctor if there is any change in your medical condition (cold, fever, infection).  Wear comfortable clothing (specific to your surgery type) to the hospital.  After surgery, you can help prevent lung complications by doing breathing exercises.  Take deep breaths and cough every 1-2 hours. Your doctor may order a device called an Incentive Spirometer to help you take deep breaths. When coughing or sneezing, hold a pillow firmly against your incision with both hands. This is called "splinting." Doing this helps protect your incision. It also decreases belly discomfort.  If you are being admitted to the hospital overnight, leave your suitcase in the car. After surgery it may be brought to your room.  If you are being discharged the day of surgery, you will not be allowed to drive home. You will need a responsible  adult (18 years or older) to drive you home and stay with you that night.   If you are taking public transportation, you will need to have a responsible adult (18 years or older) with you. Please confirm with your physician that it is acceptable to use public transportation.   Please call the [facility] at [phone] if you have any questions about these instructions.  Surgery Visitation Policy:  Patients undergoing a  surgery or procedure may have one family member or support person with them as long as that person is not COVID-19 positive or experiencing its symptoms.  That person may remain in the waiting area during the procedure.  Inpatient Visitation:    Visiting hours are 7 a.m. to 8 p.m. Inpatients will be allowed two visitors daily. The visitors may change each day during the patient's stay. No visitors under the age of 7. Any visitor under the age of 15 must be accompanied by an adult. The visitor must pass COVID-19 screenings, use hand sanitizer when entering and exiting the patient's room and wear a mask at all times, including in the patient's room. Patients must also wear a mask when staff or their visitor are in the room. Masking is required regardless of vaccination status.

## 2021-04-05 ENCOUNTER — Ambulatory Visit
Admission: RE | Admit: 2021-04-05 | Discharge: 2021-04-05 | Disposition: A | Discharge status: Home / Self Care | Payer: Medicare HMO | Attending: Orthopedic Surgery | Admitting: Orthopedic Surgery

## 2021-04-05 ENCOUNTER — Ambulatory Visit: Payer: Medicare HMO

## 2021-04-05 ENCOUNTER — Encounter: Payer: Self-pay | Admitting: Orthopedic Surgery

## 2021-04-05 ENCOUNTER — Other Ambulatory Visit: Payer: Medicare HMO

## 2021-04-05 ENCOUNTER — Encounter
Admission: RE | Disposition: A | Discharge status: Home / Self Care | Payer: Self-pay | Source: Home / Self Care | Attending: Orthopedic Surgery

## 2021-04-05 DIAGNOSIS — Z419 Encounter for procedure for purposes other than remedying health state, unspecified: Secondary | ICD-10-CM

## 2021-04-05 DIAGNOSIS — M4854XA Collapsed vertebra, not elsewhere classified, thoracic region, initial encounter for fracture: Secondary | ICD-10-CM | POA: Diagnosis not present

## 2021-04-05 DIAGNOSIS — M4856XA Collapsed vertebra, not elsewhere classified, lumbar region, initial encounter for fracture: Secondary | ICD-10-CM | POA: Diagnosis present

## 2021-04-05 DIAGNOSIS — Z881 Allergy status to other antibiotic agents status: Secondary | ICD-10-CM | POA: Diagnosis not present

## 2021-04-05 DIAGNOSIS — Z79890 Hormone replacement therapy (postmenopausal): Secondary | ICD-10-CM | POA: Insufficient documentation

## 2021-04-05 HISTORY — PX: KYPHOPLASTY: SHX5884

## 2021-04-05 IMAGING — RF DG LUMBAR SPINE 2-3V
3 series · 3 of 3 positions shown · non-contrast
Comparison: Lumbar spine MRI [DATE].

CLINICAL DATA: Surgery, elective [VP] ([VP]-CM). Additional
history provided: T10, L2 kyphoplasty. Provided fluoroscopy time: 1
minutes, 50 seconds.

EXAM:
LUMBAR SPINE - 2-3 VIEW; DG C-ARM 1-60 MIN

[Series 1: dg x-ray · 0.20mm/px · 1 of 1 slices shown]
[im 1/1]
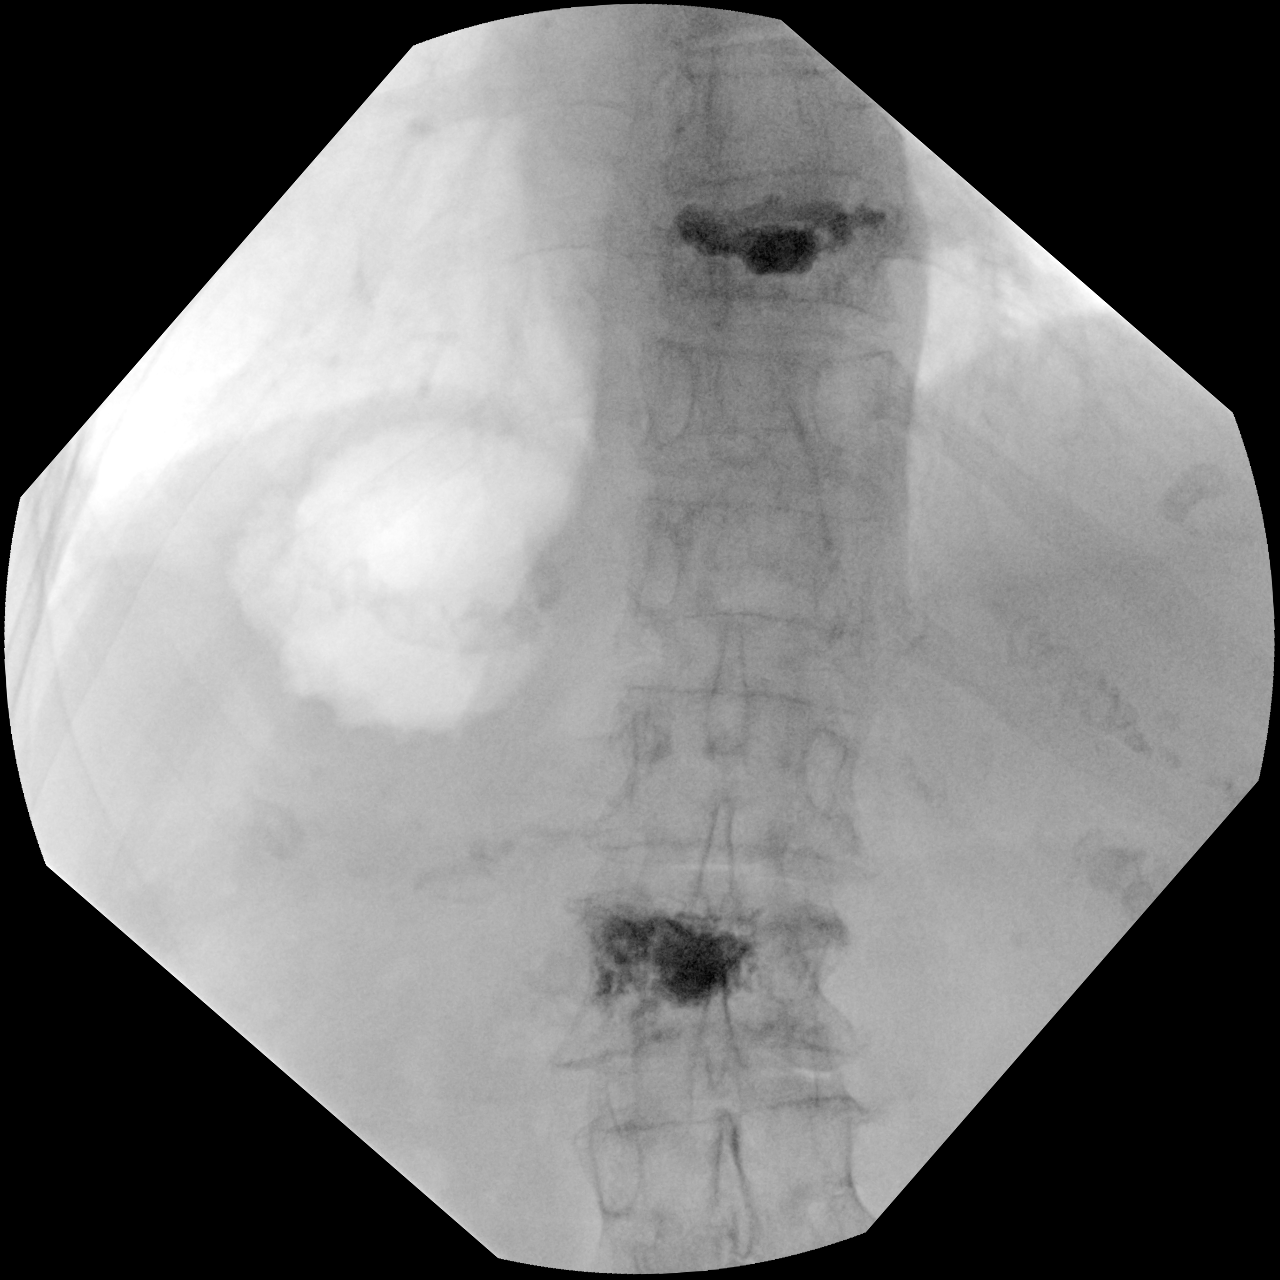

[Series 7: ortho standard · 1 of 1 slices shown (1 of 2)]
[im 1/1]
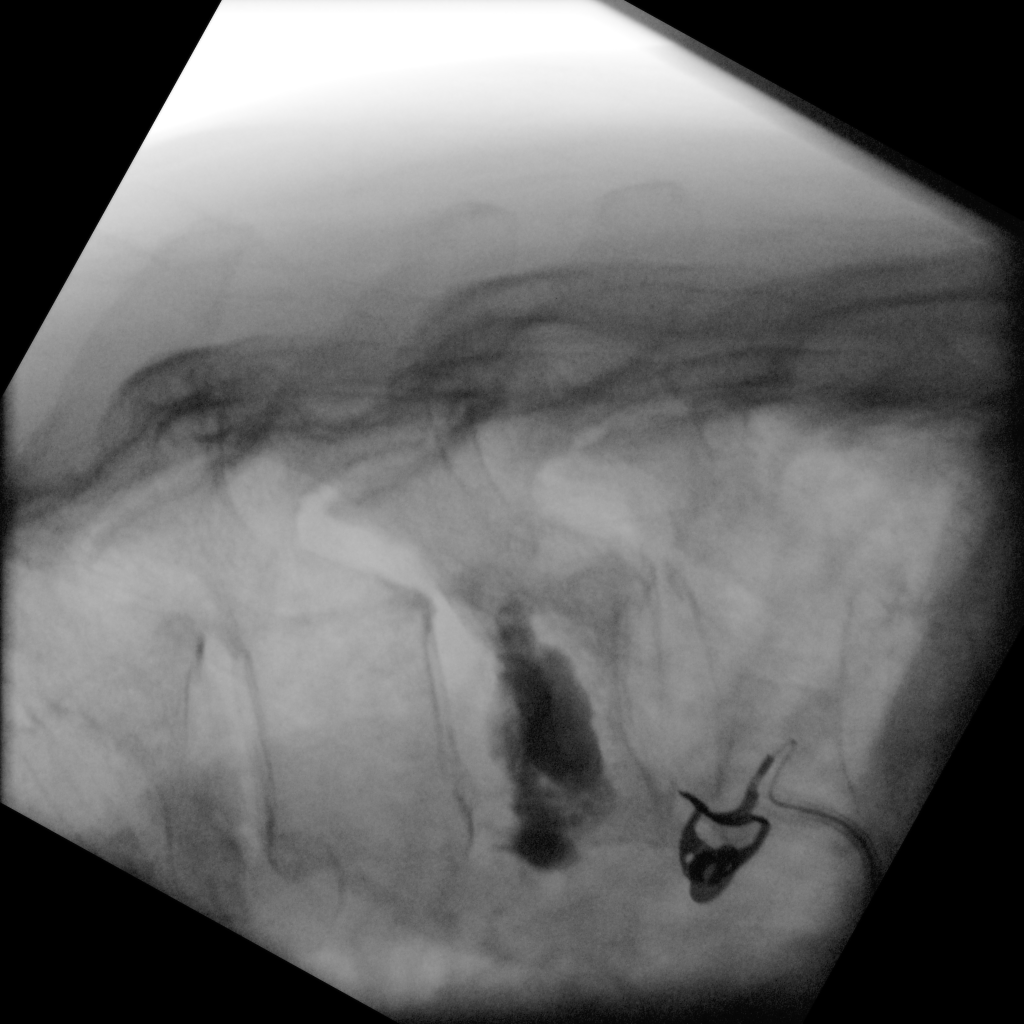

[Series 8: ortho standard · 1 of 1 slices shown (2 of 2)]
[im 1/1]
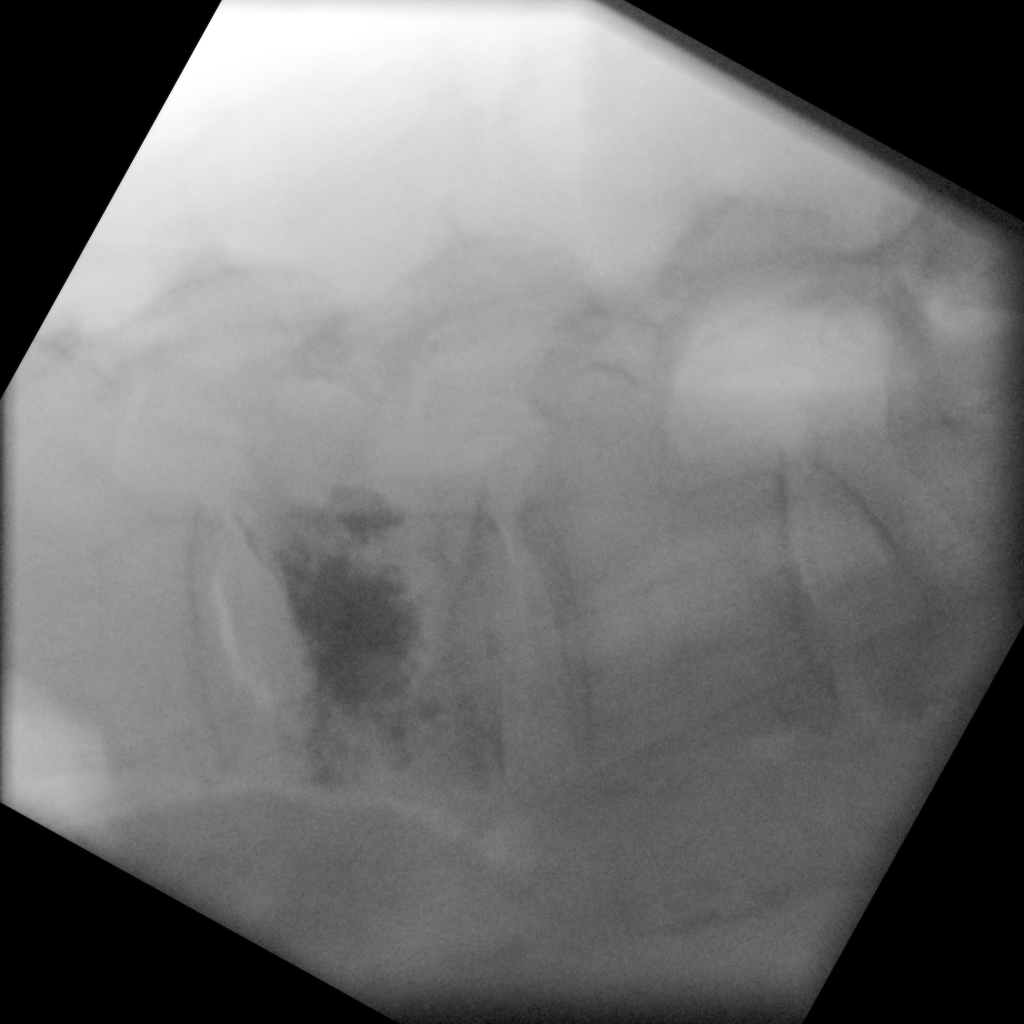

[3 of 3 positions shown; findings below may reference images not displayed]

FINDINGS: AP and lateral view intraprocedural fluoroscopic images of the
thoracolumbar spine are submitted. On the provided images,
kyphoplasty material is present within the T10 and L2 vertebral
bodies (at site of known compression fractures). There may be slight
spread of kyphoplasty material posterior to the L2 vertebral body.
IMPRESSION: Two intraprocedural fluoroscopic images of the thoracolumbar spine
from T10 and L2 kyphoplasty, as described.

## 2021-04-05 SURGERY — KYPHOPLASTY
Anesthesia: General

## 2021-04-05 MED ORDER — CEFAZOLIN SODIUM-DEXTROSE 2-4 GM/100ML-% IV SOLN
INTRAVENOUS | Status: AC
  Filled 2021-04-05: qty 100

## 2021-04-05 MED ORDER — HYDROCODONE-ACETAMINOPHEN 7.5-325 MG PO TABS
1.0000 | ORAL_TABLET | Freq: Once | ORAL | Status: DC | PRN
  Filled 2021-04-05: qty 1

## 2021-04-05 MED ORDER — DEXMEDETOMIDINE (PRECEDEX) IN NS 20 MCG/5ML (4 MCG/ML) IV SYRINGE
PREFILLED_SYRINGE | INTRAVENOUS | Status: DC | PRN
  Administered 2021-04-05 (×2): 4 ug via INTRAVENOUS

## 2021-04-05 MED ORDER — METOCLOPRAMIDE HCL 10 MG PO TABS: 5.0000 mg | ORAL_TABLET | Freq: Three times a day (TID) | ORAL | Status: DC | PRN

## 2021-04-05 MED ORDER — DEXMEDETOMIDINE HCL IN NACL 200 MCG/50ML IV SOLN: INTRAVENOUS | Status: DC | PRN

## 2021-04-05 MED ORDER — SODIUM CHLORIDE FLUSH 0.9 % IV SOLN
INTRAVENOUS | Status: AC
  Filled 2021-04-05: qty 20

## 2021-04-05 MED ORDER — FAMOTIDINE 20 MG PO TABS
ORAL_TABLET | ORAL | Status: AC
  Administered 2021-04-05: 20 mg via ORAL
  Filled 2021-04-05: qty 1

## 2021-04-05 MED ORDER — ONDANSETRON HCL 4 MG/2ML IJ SOLN
4.0000 mg | Freq: Four times a day (QID) | INTRAMUSCULAR | Status: DC | PRN
  Administered 2021-04-05: 4 mg via INTRAVENOUS

## 2021-04-05 MED ORDER — BUPIVACAINE-EPINEPHRINE (PF) 0.5% -1:200000 IJ SOLN
INTRAMUSCULAR | Status: DC | PRN
  Administered 2021-04-05: 20 mL via PERINEURAL

## 2021-04-05 MED ORDER — LACTATED RINGERS IV SOLN: INTRAVENOUS | Status: DC

## 2021-04-05 MED ORDER — ONDANSETRON HCL 4 MG/2ML IJ SOLN
INTRAMUSCULAR | Status: DC | PRN
  Administered 2021-04-05: 4 mg via INTRAVENOUS

## 2021-04-05 MED ORDER — LIDOCAINE HCL 1 % IJ SOLN
INTRAMUSCULAR | Status: DC | PRN
  Administered 2021-04-05: 10 mL
  Administered 2021-04-05: 20 mL

## 2021-04-05 MED ORDER — CHLORHEXIDINE GLUCONATE 0.12 % MT SOLN
OROMUCOSAL | Status: AC
  Administered 2021-04-05: 15 mL via OROMUCOSAL
  Filled 2021-04-05: qty 15

## 2021-04-05 MED ORDER — METOCLOPRAMIDE HCL 5 MG/ML IJ SOLN: 5.0000 mg | Freq: Three times a day (TID) | INTRAMUSCULAR | Status: DC | PRN

## 2021-04-05 MED ORDER — PHENYLEPHRINE HCL (PRESSORS) 10 MG/ML IV SOLN
INTRAVENOUS | Status: AC
  Filled 2021-04-05: qty 1

## 2021-04-05 MED ORDER — FENTANYL CITRATE (PF) 100 MCG/2ML IJ SOLN
INTRAMUSCULAR | Status: AC
  Filled 2021-04-05: qty 2

## 2021-04-05 MED ORDER — LIDOCAINE HCL (PF) 1 % IJ SOLN
INTRAMUSCULAR | Status: AC
  Filled 2021-04-05: qty 30

## 2021-04-05 MED ORDER — BUPIVACAINE-EPINEPHRINE (PF) 0.5% -1:200000 IJ SOLN
INTRAMUSCULAR | Status: AC
  Filled 2021-04-05: qty 30

## 2021-04-05 MED ORDER — CEFAZOLIN SODIUM-DEXTROSE 2-4 GM/100ML-% IV SOLN
2.0000 g | INTRAVENOUS | Status: AC
  Administered 2021-04-05: 2 g via INTRAVENOUS

## 2021-04-05 MED ORDER — ONDANSETRON HCL 4 MG/2ML IJ SOLN
INTRAMUSCULAR | Status: AC
  Filled 2021-04-05: qty 2

## 2021-04-05 MED ORDER — PROPOFOL 500 MG/50ML IV EMUL
INTRAVENOUS | Status: DC | PRN
  Administered 2021-04-05: 50 ug/kg/min via INTRAVENOUS

## 2021-04-05 MED ORDER — FAMOTIDINE 20 MG PO TABS: 20.0000 mg | ORAL_TABLET | Freq: Once | ORAL | Status: AC

## 2021-04-05 MED ORDER — FENTANYL CITRATE (PF) 100 MCG/2ML IJ SOLN: 25.0000 ug | INTRAMUSCULAR | Status: DC | PRN

## 2021-04-05 MED ORDER — ORAL CARE MOUTH RINSE: 15.0000 mL | Freq: Once | OROMUCOSAL | Status: AC

## 2021-04-05 MED ORDER — FENTANYL CITRATE (PF) 100 MCG/2ML IJ SOLN
INTRAMUSCULAR | Status: DC | PRN
  Administered 2021-04-05 (×4): 25 ug via INTRAVENOUS

## 2021-04-05 MED ORDER — ONDANSETRON HCL 4 MG PO TABS: 4.0000 mg | ORAL_TABLET | Freq: Four times a day (QID) | ORAL | Status: DC | PRN

## 2021-04-05 MED ORDER — CHLORHEXIDINE GLUCONATE 0.12 % MT SOLN: 15.0000 mL | Freq: Once | OROMUCOSAL | Status: AC

## 2021-04-05 MED ORDER — IOHEXOL 180 MG/ML  SOLN
INTRAMUSCULAR | Status: DC | PRN
  Administered 2021-04-05: 10 mL

## 2021-04-05 MED ORDER — SODIUM CHLORIDE 0.9 % IV SOLN: INTRAVENOUS | Status: DC

## 2021-04-05 SURGICAL SUPPLY — 19 items
ADH SKN CLS APL DERMABOND .7 (GAUZE/BANDAGES/DRESSINGS) ×1
CEMENT KYPHON CX01A KIT/MIXER (Cement) ×2 IMPLANT
DERMABOND ADVANCED (GAUZE/BANDAGES/DRESSINGS) ×1
DERMABOND ADVANCED .7 DNX12 (GAUZE/BANDAGES/DRESSINGS) ×1 IMPLANT
DEVICE BIOPSY BONE KYPHX (INSTRUMENTS) ×2 IMPLANT
DRAPE C-ARM XRAY 36X54 (DRAPES) ×2 IMPLANT
DURAPREP 26ML APPLICATOR (WOUND CARE) ×2 IMPLANT
GAUZE 4X4 16PLY ~~LOC~~+RFID DBL (SPONGE) ×2 IMPLANT
GLOVE SURG SYN 9.0  PF PI (GLOVE) ×1
GLOVE SURG SYN 9.0 PF PI (GLOVE) ×1 IMPLANT
GOWN SRG 2XL LVL 4 RGLN SLV (GOWNS) ×1 IMPLANT
GOWN STRL NON-REIN 2XL LVL4 (GOWNS) ×2
GOWN STRL REUS W/ TWL LRG LVL3 (GOWN DISPOSABLE) ×1 IMPLANT
GOWN STRL REUS W/TWL LRG LVL3 (GOWN DISPOSABLE) ×2
MANIFOLD NEPTUNE II (INSTRUMENTS) ×2 IMPLANT
PACK KYPHOPLASTY (MISCELLANEOUS) ×2 IMPLANT
STRAP SAFETY 5IN WIDE (MISCELLANEOUS) ×2 IMPLANT
SWABSTK COMLB BENZOIN TINCTURE (MISCELLANEOUS) ×2 IMPLANT
TRAY KYPHOPAK 15/3 EXPRESS 1ST (MISCELLANEOUS) ×2 IMPLANT

## 2021-04-05 NOTE — Transfer of Care (Signed)
Immediate Anesthesia Transfer of Care Note  Patient: Amanda Rose  Procedure(s) Performed: T10 and L2 KYPHOPLASTY  Patient Location: PACU  Anesthesia Type:MAC  Level of Consciousness: awake, alert  and oriented  Airway & Oxygen Therapy: Patient Spontanous Breathing and Patient connected to nasal cannula oxygen  Post-op Assessment: Report given to RN and Post -op Vital signs reviewed and stable  Post vital signs: Reviewed and stable  Last Vitals:  Vitals Value Taken Time  BP    Temp    Pulse 85 04/05/21 0810  Resp 22 04/05/21 0810  SpO2 99 % 04/05/21 0810  Vitals shown include unvalidated device data.  Last Pain:  Vitals:   04/05/21 0635  TempSrc: Oral  PainSc: 0-No pain         Complications: No notable events documented.

## 2021-04-05 NOTE — H&P (Signed)
Chief Complaint  Patient presents with   Spine - Pain    History of the Present Illness: Amanda Rose is a 87 y.o. female here today.   The patient presents for evaluation of back pain. The patient had seen Dr. Vance Peper here in the office with back pain. She had an MRI at [facility] on 03/20/2021 that showed T10 and L2 compression fractures, significantly at T10, with old compression fractures at T11 and L4. She had a CT scan of her abdomen back in early 01/2021 that did show the L4 fracture, but not show the others, so they may have been new.   The patient states her back is not too bad if she wears the brace. She states if she takes the brace off, she has pain. She states she injured her back in 08/2021 when she fell at Christmas time. She does not remember falling recently, but she does remember having back problems. She states her main problem is that her feet and hands have been becoming numb, which is new to her. She states she was at the beach with her [place], and her feet woke her up as they were so numb. The patient states that was concerning to her. She states she has also had diarrhea, which she has ever had before. She takes Tylenol 4 pills a day. She states she does not do a lot of bending, stretching, or lifting anything. She states she goes shopping with someone and they put it in the basket for her.  I have reviewed past medical, surgical, social and family history, and allergies as documented in the EMR.  Past Medical History: Past Medical History:  Diagnosis Date   Acquired hypothyroidism (TSH 2.4 - 11/25/19)   Anemia   Borderline diabetes mellitus (A1c 6.2% - 04/19/20) - diet controlled 07/26/2015   Carotid atherosclerosis, bilateral (U/S 12/21/17) - followed by Dr. 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 Gustavo Lah (repeat 5 yrs)   Removal of lipoma in the throat   Tubal pregnancy 1965   Past Family History: Family History  Problem Relation Age of Onset   Throat cancer Mother   Colon cancer Father   Medications: Current Outpatient Medications Ordered in Epic  Medication Sig Dispense Refill   blue-green algae, Spirulina, 500 mg Tab Take by mouth once daily   cholecalciferol (VITAMIN D3) 1000 unit tablet Take 1,000 Units by mouth once daily   cyanocobalamin (VITAMIN B12) 1000 MCG tablet Take 1,000 mcg by mouth once daily   FOLIC ACID/MULTIVIT-MIN/LUTEIN (CENTRUM SILVER ORAL) Take 1 tablet by mouth 3 (three) times a week.    LACTOBACILLUS ACIDOPHILUS (PROBIOTIC ORAL) Take 1 tablet by mouth 3 (three) times a week. (Patient not taking: No sig reported)   levothyroxine (SYNTHROID) 100 MCG tablet TAKE 1 TABLET (100 MCG TOTAL) BY MOUTH ONCE DAILY TAKE ON AN EMPTY STOMACH  WITH A GLASS OF WATER AT LEAST 30-60 MINUTES BEFORE BREAKFAST. 90 tablet 1   No current Epic-ordered facility-administered medications on file.   Allergies: Allergies  Allergen Reactions   Levaquin [Levofloxacin] Other (See Comments)  Pt's son had trouble with med. Pt does not want to take.    Body mass index is 23.28 kg/m.  Review of Systems: A comprehensive 14 point ROS was performed, reviewed, and the pertinent orthopaedic findings are documented in the HPI.  Vitals:  04/03/21 0854  BP: (!) 144/72    General  Physical Examination:   General/Constitutional: No apparent distress: well-nourished and well developed. Eyes: Pupils equal, round with synchronous movement. Lungs: Clear to auscultation HEENT: Normal Vascular: No edema, swelling or tenderness, except as noted in detailed exam. Cardiac: Heart rate and rhythm is regular. Integumentary: No impressive skin lesions present, except as noted in detailed exam. Neuro/Psych: Normal mood and affect, oriented to person, place and time.  Musculoskeletal Examination:  On exam, tenderness to T10.  Radiographs:  No new imaging studies were obtained today.  Assessment: ICD-10-CM  1. Closed wedge compression fracture of L2 vertebra with delayed healing, subsequent encounter S32.020G  2. Thoracic compression fracture, closed, initial encounter (CMS-HCC) S22.000A   Plan:  The patient has clinical findings of osteoporosis with T10 and L2 compression fractures with old compression fractures at T11 and L4.  We discussed the patient's treatment options. I suspect she had an injury that may have occurred at Christmas, and she likely reinjured her back recently. I explained she has 2 fractures, and I do not see anything on her x-ray that would cause her numbness. I recommend a kyphoplasty procedure. I gave the patient a brochure on kyphoplasty. She would like to proceed with surgical treatment of her fractures.  We will schedule the patient for surgery in the near future.  Surgical Risks:  The nature of the condition and the proposed procedure has been reviewed in detail with the patient. Surgical versus non-surgical options and prognosis for recovery have been reviewed and the inherent risks and benefits of each have been discussed including the risks of infection, bleeding, injury to nerves/blood vessels/tendons, incomplete relief of symptoms, persisting pain and/or stiffness, loss of function, complex regional pain syndrome, failure of the procedure, as  appropriate.  Attestation: I, Dawn Royse, am documenting for [employer], MD utilizing [place].    Electronically signed by Lauris Poag, MD at 04/04/2021 8:04 PM EDT  Reviewed  H+P. No changes noted.

## 2021-04-05 NOTE — Anesthesia Postprocedure Evaluation (Signed)
Anesthesia Post Note  Patient: Amanda Rose  Procedure(s) Performed: T10 and L2 KYPHOPLASTY  Patient location during evaluation: PACU Anesthesia Type: General Level of consciousness: awake and alert Pain management: pain level controlled Vital Signs Assessment: post-procedure vital signs reviewed and stable Respiratory status: spontaneous breathing, nonlabored ventilation, respiratory function stable and patient connected to nasal cannula oxygen Cardiovascular status: blood pressure returned to baseline and stable Postop Assessment: no apparent nausea or vomiting Anesthetic complications: no   No notable events documented.   Last Vitals:  Vitals:   04/05/21 0810 04/05/21 0815  BP: 117/69 136/70  Pulse: 85 84  Resp: (!) 22 19  Temp: (!) 36.2 C   SpO2: 99% 99%    Last Pain:  Vitals:   04/05/21 0810  TempSrc:   PainSc: 0-No pain                 Margaree Mackintosh

## 2021-04-05 NOTE — Op Note (Signed)
04/05/2021  8:09 AM  PATIENT:  Amanda Rose   MRN: [id number]   PRE-OPERATIVE DIAGNOSIS:  closed wedge compression fracture of T10 and L2   POST-OPERATIVE DIAGNOSIS:  closed wedge compression fracture of T10 and L2   PROCEDURE:  Procedure(s): KYPHOPLASTY T10 and L2  SURGEON: Laurene Footman, MD   ASSISTANTS: None   ANESTHESIA:   local and MAC   EBL:  No intake/output data recorded.   BLOOD ADMINISTERED:none   DRAINS: none    LOCAL MEDICATIONS USED:  MARCAINE    and XYLOCAINE    SPECIMEN: T10 and L2 vertebral body biopsies   DISPOSITION OF SPECIMEN: Pathology   COUNTS:  YES   TOURNIQUET:  * No tourniquets in log *   IMPLANTS: Bone cement   DICTATION: .Dragon Dictation  patient was brought to the operating room and after adequate anesthesia was obtained the patient was placed prone.  C arm was brought in in good visualization of the affected level obtained on both AP and lateral projections.  After patient identification and timeout procedures were completed, local anesthetic was infiltrated with 10 cc 1% Xylocaine infiltrated subcutaneously.  This is done the area on the each side of the planned approach.  The back was then prepped and draped in the usual sterile manner and repeat timeout procedure carried out.  A spinal needle was brought down to the pedicle on the right side of T10 and the left side of L2 and a 50-50 mix of 1% Xylocaine half percent Sensorcaine with epinephrine total of 20 cc injected on each side.  After allowing this to set a small incision was made and the trocar was advanced into the vertebral body in an extrapedicular fashion.  Biopsy was obtained at each level drilling was carried out balloon inserted with inflation to 1-1/2 cc on the right at T10 across the midline and 2-1/2 cc on the left.  At L2 again crossing the midline.  When the cement was appropriate consistency 2.5 cc were injected on the right at T10 without extravasation and 4 cc on the left  into the vertebral body of L2 without extravasation, good fill superior to inferior endplates and from right to left sides along the inferior endplate.  After the cement had set the trochar was removed and permanent C-arm views obtained.  The wound was closed with Dermabond followed by Band-Aid   PLAN OF CARE: Discharge home after recovery room   PATIENT DISPOSITION:  PACU - hemodynamically stable.

## 2021-04-05 NOTE — Anesthesia Preprocedure Evaluation (Addendum)
Anesthesia Evaluation  Patient identified by MRN, date of birth, ID band Patient awake    Reviewed: Allergy & Precautions, NPO status , Patient's Chart, lab work & pertinent test results  Airway Mallampati: II  TM Distance: >3 FB Neck ROM: full    Dental no notable dental hx. (+) Chipped   Pulmonary neg pulmonary ROS,    Pulmonary exam normal        Cardiovascular negative cardio ROS Normal cardiovascular exam     Neuro/Psych negative neurological ROS  negative psych ROS   GI/Hepatic negative GI ROS, Neg liver ROS,   Endo/Other  negative endocrine ROS  Renal/GU negative Renal ROS  negative genitourinary   Musculoskeletal   Abdominal   Peds  Hematology negative hematology ROS (+)   Anesthesia Other Findings Past Medical History: No date: Hypothyroidism No date: Pre-diabetes No date: Thyroid disease  History reviewed. No pertinent surgical history.  BMI    Body Mass Index: 23.44 kg/m      Reproductive/Obstetrics negative OB ROS                            Anesthesia Physical Anesthesia Plan  ASA: 2  Anesthesia Plan: General   Post-op Pain Management:    Induction: Intravenous  PONV Risk Score and Plan: Ondansetron, Dexamethasone, Midazolam and Treatment may vary due to age or medical condition  Airway Management Planned: Natural Airway and Nasal Cannula  Additional Equipment:   Intra-op Plan:   Post-operative Plan:   Informed Consent: I have reviewed the patients History and Physical, chart, labs and discussed the procedure including the risks, benefits and alternatives for the proposed anesthesia with the patient or authorized representative who has indicated his/her understanding and acceptance.     Dental Advisory Given  Plan Discussed with: Anesthesiologist, CRNA and Surgeon  Anesthesia Plan Comments: (Patient consented for risks of anesthesia including but not  limited to:  - adverse reactions to medications - risk of airway placement if required - damage to eyes, teeth, lips or other oral mucosa - nerve damage due to positioning  - sore throat or hoarseness - Damage to heart, brain, nerves, lungs, other parts of body or loss of life  Patient voiced understanding.)       Anesthesia Quick Evaluation

## 2021-04-05 NOTE — Discharge Instructions (Addendum)
Take it easy today and try to start walking is much as you can starting tomorrow. Call office if you are having problems. Remove Band-Aids on Sunday then okay to shower [place]   The drugs that you were given will stay in your system until tomorrow so for the next 24 hours you should not:  Drive an automobile Make any legal decisions Drink any alcoholic beverage   You may resume regular meals tomorrow.  Today it is better to start with liquids and gradually work up to solid foods.  You may eat anything you prefer, but it is better to start with liquids, then soup and crackers, and gradually work up to solid foods.   Please notify your doctor immediately if you have any unusual bleeding, trouble breathing, redness and pain at the surgery site, drainage, fever, or pain not relieved by medication.     Your post-operative visit with Dr.                                       is: Date:                        Time:    Please call to schedule your post-operative visit.  Additional Instructions:

## 2021-04-08 LAB — SURGICAL PATHOLOGY

## 2021-11-20 ENCOUNTER — Encounter: Payer: Self-pay | Admitting: Internal Medicine

## 2021-11-20 ENCOUNTER — Inpatient Hospital Stay: Payer: Medicare HMO | Attending: Internal Medicine | Admitting: Internal Medicine

## 2021-11-20 ENCOUNTER — Inpatient Hospital Stay: Payer: Medicare HMO

## 2021-11-20 ENCOUNTER — Other Ambulatory Visit: Payer: Self-pay

## 2021-11-20 DIAGNOSIS — M810 Age-related osteoporosis without current pathological fracture: Secondary | ICD-10-CM | POA: Insufficient documentation

## 2021-11-20 DIAGNOSIS — D72821 Monocytosis (symptomatic): Secondary | ICD-10-CM | POA: Insufficient documentation

## 2021-11-20 DIAGNOSIS — M549 Dorsalgia, unspecified: Secondary | ICD-10-CM | POA: Diagnosis not present

## 2021-11-20 DIAGNOSIS — N183 Chronic kidney disease, stage 3 unspecified: Secondary | ICD-10-CM | POA: Diagnosis not present

## 2021-11-20 DIAGNOSIS — E039 Hypothyroidism, unspecified: Secondary | ICD-10-CM | POA: Insufficient documentation

## 2021-11-20 DIAGNOSIS — D539 Nutritional anemia, unspecified: Secondary | ICD-10-CM | POA: Diagnosis not present

## 2021-11-20 DIAGNOSIS — Z809 Family history of malignant neoplasm, unspecified: Secondary | ICD-10-CM | POA: Insufficient documentation

## 2021-11-20 LAB — CBC WITH DIFFERENTIAL/PLATELET
Abs Immature Granulocytes: 0.2 10*3/uL — ABNORMAL HIGH (ref 0.00–0.07)
Basophils Absolute: 0.1 10*3/uL (ref 0.0–0.1)
Basophils Relative: 1 %
Eosinophils Absolute: 0.3 10*3/uL (ref 0.0–0.5)
Eosinophils Relative: 2 %
HCT: 31 % — ABNORMAL LOW (ref 36.0–46.0)
Hemoglobin: 10.4 g/dL — ABNORMAL LOW (ref 12.0–15.0)
Immature Granulocytes: 1 %
Lymphocytes Relative: 13 %
Lymphs Abs: 2.2 10*3/uL (ref 0.7–4.0)
MCH: 35.6 pg — ABNORMAL HIGH (ref 26.0–34.0)
MCHC: 33.5 g/dL (ref 30.0–36.0)
MCV: 106.2 fL — ABNORMAL HIGH (ref 80.0–100.0)
Monocytes Absolute: 7 10*3/uL — ABNORMAL HIGH (ref 0.1–1.0)
Monocytes Relative: 41 %
Neutro Abs: 7.4 10*3/uL (ref 1.7–7.7)
Neutrophils Relative %: 42 %
Platelets: 237 10*3/uL (ref 150–400)
RBC: 2.92 MIL/uL — ABNORMAL LOW (ref 3.87–5.11)
RDW: 14.6 % (ref 11.5–15.5)
Smear Review: NORMAL
WBC: 17.1 10*3/uL — ABNORMAL HIGH (ref 4.0–10.5)
nRBC: 0 % (ref 0.0–0.2)

## 2021-11-20 LAB — COMPREHENSIVE METABOLIC PANEL
ALT: 14 U/L (ref 0–44)
AST: 20 U/L (ref 15–41)
Albumin: 4 g/dL (ref 3.5–5.0)
Alkaline Phosphatase: 108 U/L (ref 38–126)
Anion gap: 7 (ref 5–15)
BUN: 20 mg/dL (ref 8–23)
CO2: 27 mmol/L (ref 22–32)
Calcium: 9 mg/dL (ref 8.9–10.3)
Chloride: 100 mmol/L (ref 98–111)
Creatinine, Ser: 1.25 mg/dL — ABNORMAL HIGH (ref 0.44–1.00)
GFR, Estimated: 42 mL/min — ABNORMAL LOW (ref >60)
Glucose, Bld: 109 mg/dL — ABNORMAL HIGH (ref 70–99)
Potassium: 4.2 mmol/L (ref 3.5–5.1)
Sodium: 134 mmol/L — ABNORMAL LOW (ref 135–145)
Total Bilirubin: 0.2 mg/dL — ABNORMAL LOW (ref 0.3–1.2)
Total Protein: 7.2 g/dL (ref 6.5–8.1)

## 2021-11-20 LAB — LACTATE DEHYDROGENASE: LDH: 205 U/L — ABNORMAL HIGH (ref 98–192)

## 2021-11-20 LAB — PATHOLOGIST SMEAR REVIEW

## 2021-11-20 NOTE — Progress Notes (Signed)
[facility] ?OFFICE PROGRESS NOTE ? ?Patient Care Team: ?Dion Body, MD as PCP - General (Family Medicine) ? ? ?# HEMATOLOGY HISTORY: ? ?# LEUCOCYTOSIS- [FEB 2023- PCP] WBC 35 [predominant monocytosis; mild lymphocytosis; moderate neutrophilia]; [name]; platelets- 180 ? ? ?39.5 High   Smear review agrees with analyzer results, white count elevated.     ?RBC (Red Blood Cell Count) 4.04 - 5.48 10?6/uL 3.02 Low     ?Hemoglobin 12.0 - 15.0 gm/dL 10.5 Low     ?Hematocrit 35.0 - 47.0 % 31.9 Low     ?MCV (Mean Corpuscular Volume) 80.0 - 100.0 fl 105.6 High     ?MCH (Mean Corpuscular Hemoglobin) 27.0 - 31.2 pg 34.8 High     ?MCHC (Mean Corpuscular Hemoglobin Concentration) 32.0 - 36.0 gm/dL 32.9    ?Platelet Count 150 - 450 10?3/uL 180    ?RDW-CV (Red Cell Distribution Width) 11.6 - 14.8 % 14.5    ?MPV (Mean Platelet Volume) 9.4 - 12.4 fl 9.1 Low     ?Neutrophils 1.50 - 7.80 10?3/uL 16.28 High     ?Lymphocytes 1.00 - 3.60 10?3/uL 4.61 High     ?Monocytes 0.00 - 1.50 10?3/uL 16.92 High     ?Eosinophils 0.00 - 0.55 10?3/uL 0.48    ?Basophils 0.00 - 0.09 10?3/uL 0.07    ?Neutrophil % 32.0 - 70.0 % 41.2    ?Lymphocyte % 10.0 - 50.0 % 11.7    ?Monocyte % 4.0 - 13.0 % 42.8 High   See manual diff and path review.   ?Eosinophil % 1.0 - 5.0 % 1.2    ?Basophil% 0.0 - 2.0 % 0.2    ?Immature Granulocyte % <=0.7 % 2.9 High     ?Immature Granulocyte Count <=0.06 10^3/?L 1.15 High     ? ? ?Oncology History  ? No history exists.  ? ?  ?HPI: Patient ambulating independently; kyphosis.  Accompanied by daughter ? ?Amanda Rose 88 y.o.  female pleasant patient no significant past medical history; has been referred to [place] for elevated white count. ? ?In December 2022 patient had COVID; noted to have intermittent cough for which patient was treated with prednisone.  Patient noted to have elevated white count/as noted above. ? ? ?Night sweats: none ?Weight loss:none ?Early satiety: good.   ? ?Infections:NONE ?Splenectomy: NONE ?Steroids: recently on steroids-finished February 24.  ?Smoke: none ?Skin rash: none ? ? ?Review of Systems  ?Constitutional:  Negative for chills, diaphoresis and fever.  ?HENT:  Negative for nosebleeds and sore throat.   ?Eyes:  Negative for double vision.  ?Respiratory:  Positive for cough. Negative for hemoptysis and wheezing.   ?Cardiovascular:  Negative for chest pain, palpitations, orthopnea and leg swelling.  ?Gastrointestinal:  Negative for abdominal pain, blood in stool, constipation, diarrhea, heartburn, melena, nausea and vomiting.  ?Genitourinary:  Negative for dysuria, frequency and urgency.  ?Musculoskeletal:  Negative for joint pain.  ?Skin: Negative.  Negative for itching and rash.  ?Neurological:  Negative for dizziness, tingling, focal weakness, weakness and headaches.  ?Endo/Heme/Allergies:  Does not bruise/bleed easily.  ?Psychiatric/Behavioral:  Negative for depression. The patient is not nervous/anxious and does not have insomnia.   ?  ? ?PAST MEDICAL HISTORY :  ?Past Medical History:  ?Diagnosis Date  ? Hypothyroidism   ? Pre-diabetes   ? Thyroid disease   ? ? ?PAST SURGICAL HISTORY :   ?Past Surgical History:  ?Procedure Laterality Date  ? KYPHOPLASTY N/A 04/05/2021  ? Procedure: T10 and L2  KYPHOPLASTY;  Surgeon: Hessie Knows, MD;  [name];  Service: Orthopedics;  [name];  ? ? ?FAMILY HISTORY :   ?Family History  ?Problem Relation Age of Onset  ? Cancer Father   ?     unknown  ? ? ?SOCIAL HISTORY:   ?Social History  ? ?Tobacco Use  ? Smoking status: Never  ? Smokeless tobacco: Never  ?Vaping Use  ? Vaping Use: Never used  ?Substance Use Topics  ? Alcohol use: Never  ? Drug use: Never  ? ? ?ALLERGIES:  is allergic to other. ? ?MEDICATIONS:  ?Current Outpatient Medications  ?Medication Sig Dispense Refill  ? acetaminophen (TYLENOL) 500 MG tablet Take 1,000 mg by mouth in the morning and at bedtime.    ? cetirizine (ZYRTEC) 10 MG tablet  Take 10 mg by mouth in the morning.    ? Cholecalciferol (VITAMIN D-3) 125 MCG (5000 UT) TABS Take 5,000 Units by mouth in the morning.    ? Cyanocobalamin (VITAMIN B-12) 5000 MCG SUBL Take 5,000 mcg by mouth in the morning.    ? diphenhydrAMINE (SOMINEX) 25 MG tablet Take 25 mg by mouth at bedtime. Sleep Aid    ? levothyroxine (SYNTHROID) 100 MCG tablet Take 100 mcg by mouth daily before breakfast.    ? Multiple Vitamin (MULTIVITAMIN WITH MINERALS) TABS tablet Take 1 tablet by mouth in the morning. Equate Complete Multivitamin    ? Multiple Vitamins-Minerals (ALGAE BASED CALCIUM) TABS Take 2 tablets by mouth in the morning and at bedtime. AlgaeCal Plus Plant-Based Calcium Supplement with Magnesium Boron Vitamin K2 + D3    ? Multiple Vitamins-Minerals (BONE SMART PO) Take 2 tablets by mouth daily in the afternoon. Strontium Boost Natural Strontium Citrate Supplement    ? ?No current facility-administered medications for this visit.  ? ? ?PHYSICAL EXAMINATION: ? ?BP 137/70 (BP Location: Left Arm, Patient Position: Sitting, Cuff Size: Normal)   Pulse 85   Temp 97.9 ?F (36.6 ?C) (Tympanic)   Ht 5' (1.524 m)   Wt 117 lb 9.6 oz (53.3 kg)   SpO2 96%   BMI 22.97 kg/m?  ? Danley Danker Weights  ? 11/20/21 1405  ?Weight: 117 lb 9.6 oz (53.3 kg)  ? ? ?Physical Exam ?Vitals and nursing note reviewed.  ?HENT:  ?   Head: Normocephalic and atraumatic.  ?   Mouth/Throat:  ?   Pharynx: Oropharynx is clear.  ?Eyes:  ?   Extraocular Movements: Extraocular movements intact.  ?   Pupils: Pupils are equal, round, and reactive to light.  ?Cardiovascular:  ?   Rate and Rhythm: Normal rate and regular rhythm.  ?Pulmonary:  ?   Comments: Decreased breath sounds bilaterally.  ?Abdominal:  ?   Palpations: Abdomen is soft.  ?Musculoskeletal:     ?   General: Normal range of motion.  ?   Cervical back: Normal range of motion.  ?Skin: ?   General: Skin is warm.  ?Neurological:  ?   General: No focal deficit present.  ?   Mental Status: She is  alert and oriented to person, place, and time.  ?Psychiatric:     ?   Behavior: Behavior normal.     ?   Judgment: Judgment normal.  ? ? ? ? ? ?LABORATORY DATA:  ?I have reviewed the data as listed ?   ?Component Value Date/Time  ? NA 140 12/03/2017 1003  ? K 4.1 12/03/2017 1003  ? CL 106 12/03/2017 1003  ? CO2 25 12/03/2017 1003  ?  GLUCOSE 98 12/03/2017 1003  ? BUN 20 12/03/2017 1003  ? CREATININE 0.74 12/03/2017 1003  ? CALCIUM 9.1 12/03/2017 1003  ? GFRNONAA >60 12/03/2017 1003  ? GFRAA >60 12/03/2017 1003  ? ? ?No results found for: SPEP, UPEP ? ?Lab Results  ?Component Value Date  ? WBC 11.2 (H) 04/04/2021  ? HGB 10.0 (L) 04/04/2021  ? HCT 29.4 (L) 04/04/2021  ? MCV 106.9 (H) 04/04/2021  ? PLT 164 04/04/2021  ? ? ?  Chemistry   ?   ?Component Value Date/Time  ? NA 140 12/03/2017 1003  ? K 4.1 12/03/2017 1003  ? CL 106 12/03/2017 1003  ? CO2 25 12/03/2017 1003  ? BUN 20 12/03/2017 1003  ? CREATININE 0.74 12/03/2017 1003  ?    ?Component Value Date/Time  ? CALCIUM 9.1 12/03/2017 1003  ?  ? ? ? ?RADIOGRAPHIC STUDIES: ?I have personally reviewed the radiological images as listed and agreed with the findings in the report. ?No results found.  ? ?ASSESSMENT & PLAN:  ?Monocytosis ?#FEB 2023-Monocytosis-/leukocytosis up to 45 [on prednisone PCP-[facility]-]; hemoglobin-10.5 MCV 105; platelets 180.  This is highly concerning for malignancy.  However discussed with the patient and family the multiple causes of elevated white count including reactive-inflammatory infectious medication induced.  ? 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  Patient continues to be asymptomatic at this time. ? ?# #Check CBC CMP LDH; review of peripheral smear; peripheral blood flow cytometry; also MPN work-up.  Discussed the possible need for bone marrow biopsy.  ? ?# In general I discussed with the patient the bone marrow  biopsy and aspiration indication and procedure at length.  Given significant discomfort involved-I would recommend under anesthesia/with radiology in the hospital. I discussed the potential complications include-bleeding/trauma and risk

## 2021-11-20 NOTE — Progress Notes (Signed)
Left a detailed voicemail for the patient stating that her blood counts are improved. All the still abnormal. Further work up is still pending. Follow up as planned.

## 2021-11-20 NOTE — Assessment & Plan Note (Addendum)
#  FEB 2023-Monocytosis-/leukocytosis up to 45 [on prednisone PCP-[facility]-]; hemoglobin-10.5 MCV 105; platelets 180.  This is highly concerning for malignancy.  However discussed with the patient and family the multiple causes of elevated white count including reactive-inflammatory infectious medication induced.  ? 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  Patient continues to be asymptomatic at this time. ? ?# #Check CBC CMP LDH; review of peripheral smear; peripheral blood flow cytometry; also MPN work-up.  Discussed the possible need for bone marrow biopsy.  ? ?# In general I discussed with the patient the bone marrow biopsy and aspiration indication and procedure at length.  Given significant discomfort involved-I would recommend under anesthesia/with radiology in the hospital. I discussed the potential complications include-bleeding/trauma and risk of infection; which are fortunately very rare.  We will wait for above work-up/to decide if patient needs a bone marrow.  If unremarkable we will hold off bone marrow at this time. ? ?# CKD stage III [50s]- STABLE>  ? ?# Thank you Dr.Linthavong for allowing me to participate in the care of your pleasant patient. Please do not hesitate to contact me with questions or concerns in the interim. ? ?* pt cell- [phone] ?# # DISPOSITION: ?# blood work today ?# Follow up 2-3 weeks- MD; No labs-Dr.B ? ?

## 2021-11-24 ENCOUNTER — Telehealth: Payer: Self-pay | Admitting: Internal Medicine

## 2021-11-24 NOTE — Telephone Encounter (Signed)
I had left a message for the pt that pt's - blood counts are improving [since off prednsione]. 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  ? ? ?However, this is fairly asymptomatic/chronic this can be monitored for now.  Would recommend holding off a bone marrow biopsy unless patient is interested in further work-up at this time.  However this would not change the management. ? ?Thanks ?GB ?

## 2021-11-26 NOTE — Telephone Encounter (Signed)
Noted  

## 2021-11-27 ENCOUNTER — Other Ambulatory Visit: Payer: Medicare HMO

## 2021-11-27 ENCOUNTER — Encounter: Payer: Medicare HMO | Admitting: Internal Medicine

## 2021-11-27 LAB — BCR-ABL1 KINASE DOMAIN MUTATION ANALYSIS

## 2021-11-27 LAB — JAK2 GENOTYPR

## 2021-11-27 LAB — MPL MUTATION ANALYSIS

## 2021-11-28 ENCOUNTER — Other Ambulatory Visit: Payer: Self-pay | Admitting: Physician Assistant

## 2021-11-28 ENCOUNTER — Other Ambulatory Visit (HOSPITAL_COMMUNITY): Payer: Self-pay | Admitting: Physician Assistant

## 2021-11-28 DIAGNOSIS — S22080A Wedge compression fracture of T11-T12 vertebra, initial encounter for closed fracture: Secondary | ICD-10-CM

## 2021-11-28 LAB — CALRETICULIN (CALR) MUTATION ANALYSIS

## 2021-11-29 ENCOUNTER — Ambulatory Visit
Admission: RE | Admit: 2021-11-29 | Discharge: 2021-11-29 | Disposition: A | Discharge status: Home / Self Care | Payer: Medicare HMO | Source: Ambulatory Visit | Attending: Physician Assistant | Admitting: Physician Assistant

## 2021-11-29 DIAGNOSIS — S22080A Wedge compression fracture of T11-T12 vertebra, initial encounter for closed fracture: Secondary | ICD-10-CM | POA: Insufficient documentation

## 2021-11-29 IMAGING — MR MR THORACIC SPINE W/O CM
7 of 8 series · 33 of 48 positions shown · non-contrast
Comparison: Lumbar spine radiographs [DATE], thoracic spine
radiographs [DATE].

CLINICAL DATA: Fall last year, kyphoplasty in [DATE]. Mid to
lower back pain with radiographs showing T11 fracture.

EXAM:
MRI THORACIC SPINE WITHOUT CONTRAST
TECHNIQUE: Multiplanar, multisequence MR imaging of the thoracic spine was
performed. No intravenous contrast was administered.

[Series 16: T1 · sagittal · 5.0mm · 1.88mm/px · 3 of 9 slices shown (1 of 2)]
[im 1/9]
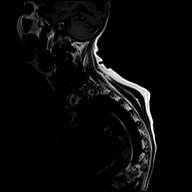
[im 5/9]
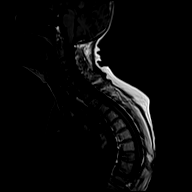
[im 9/9]
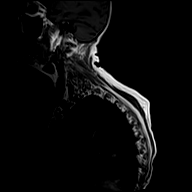

[Series 18: T2 · axial · 4.0mm · 0.59mm/px · z∈[-245,+16]mm · 9 of 39 slices shown (1 of 3)]
[im 1/39]
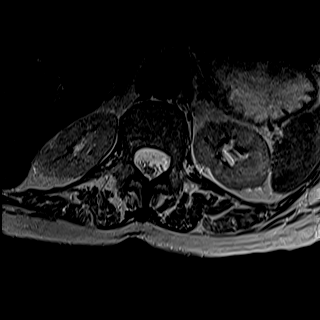
[im 5/39]
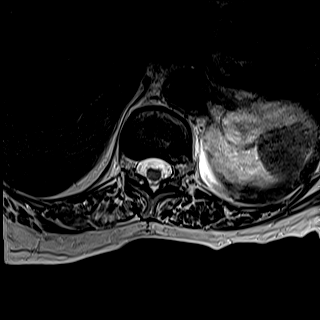
[im 10/39]
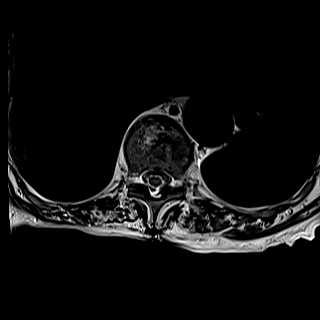
[im 15/39]
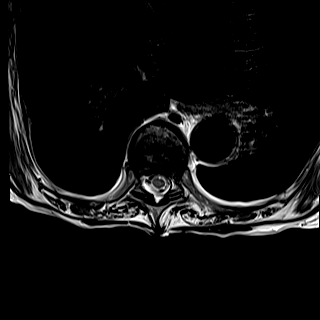
[im 20/39]
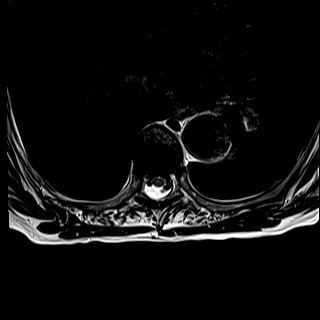
[im 24/39]
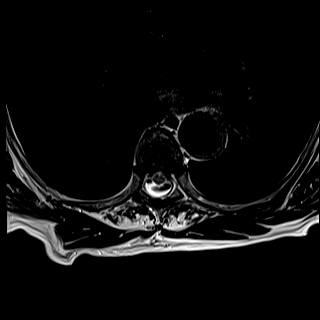
[im 29/39]
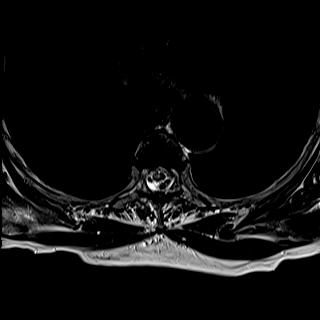
[im 34/39]
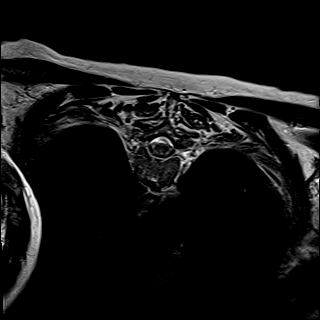
[im 39/39]
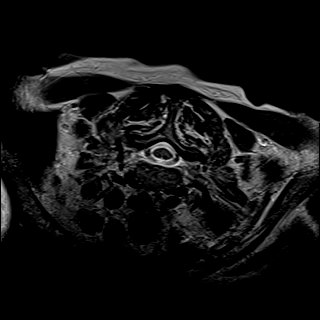

[Series 19: T1 · sagittal · 3.0mm · 1.06mm/px · 4 of 21 slices shown (2 of 2)]
[im 1/21]
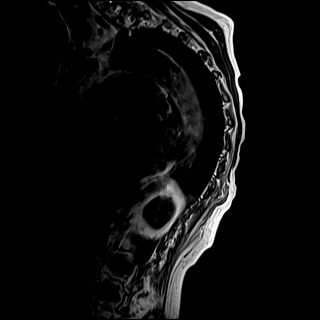
[im 7/21]
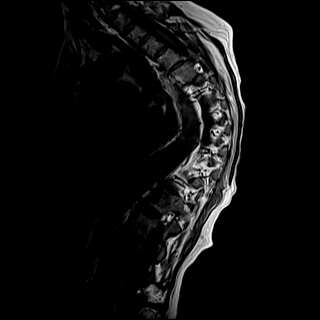
[im 14/21]
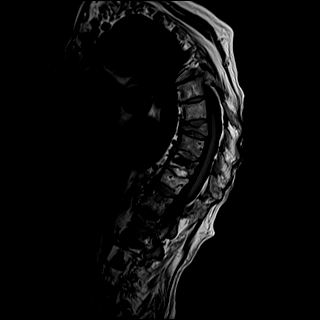
[im 21/21]
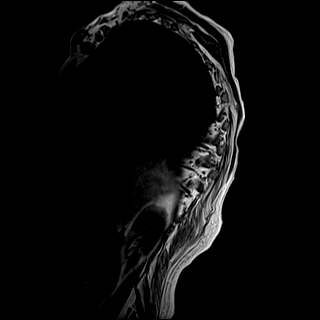

[Series 20: T2 · sagittal · 3.0mm · 1.06mm/px · 4 of 21 slices shown (2 of 3)]
[im 1/21]
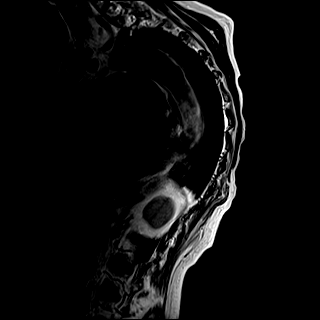
[im 7/21]
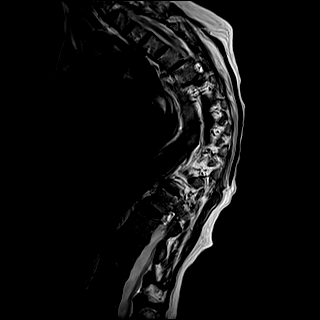
[im 14/21]
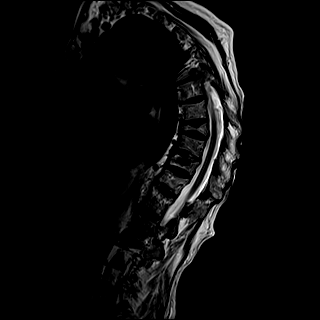
[im 21/21]
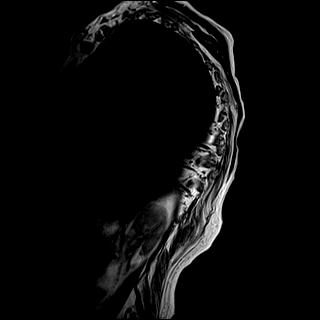

[Series 21: STIR · sagittal · 3.0mm · 0.53mm/px · 4 of 21 slices shown]
[im 1/21]
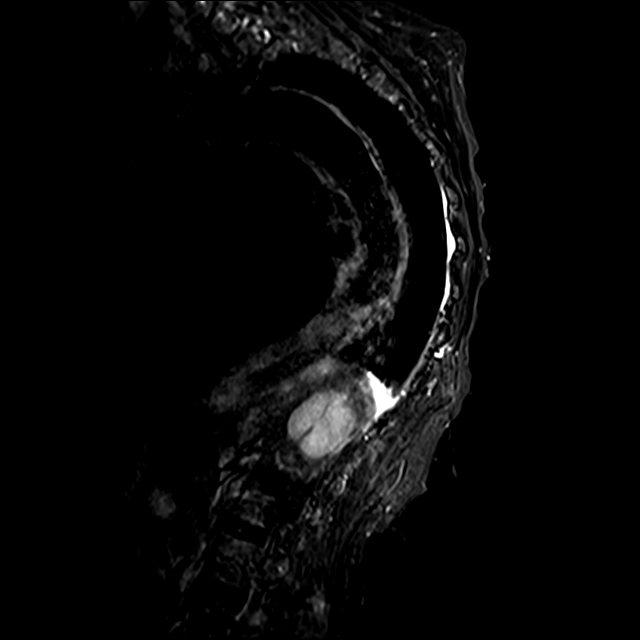
[im 7/21]
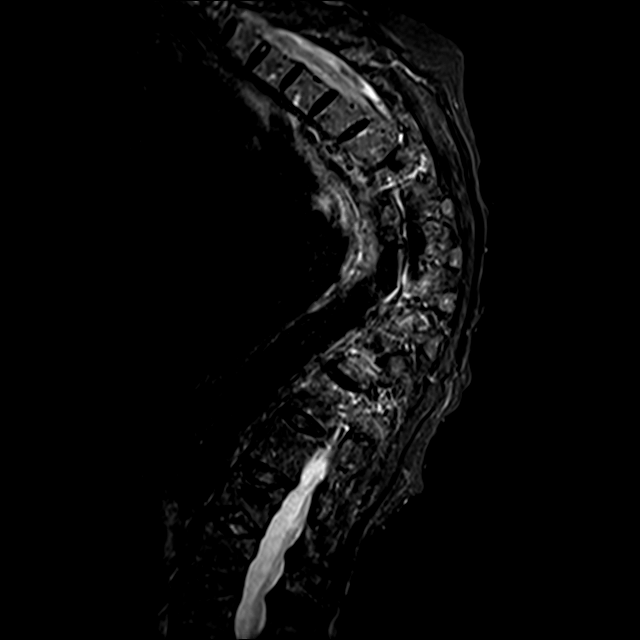
[im 14/21]
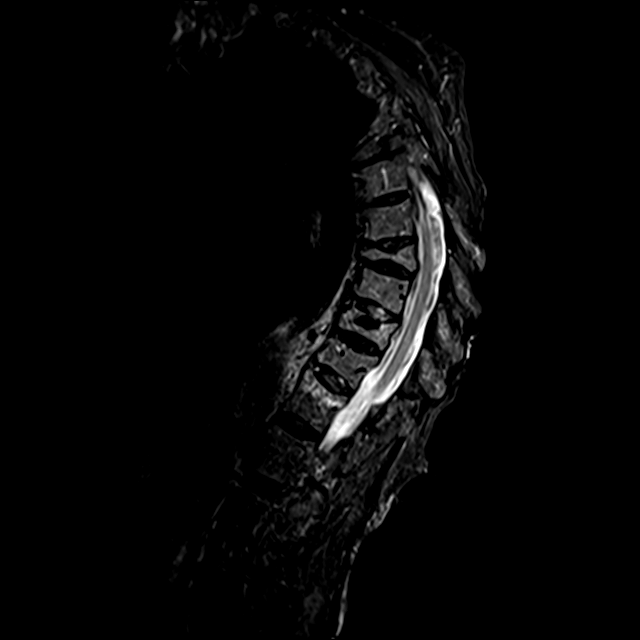
[im 21/21]
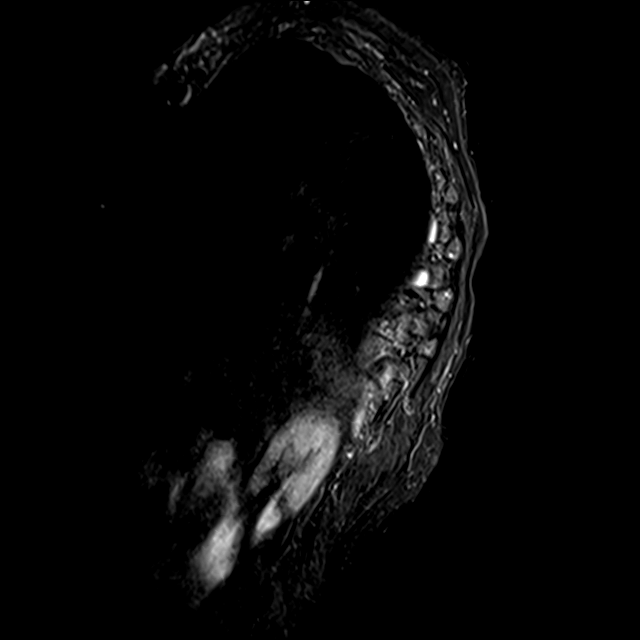

[Series 22: GRE · axial · 4.0mm · 0.37mm/px · 1 of 39 slices shown]
[im 1/39]
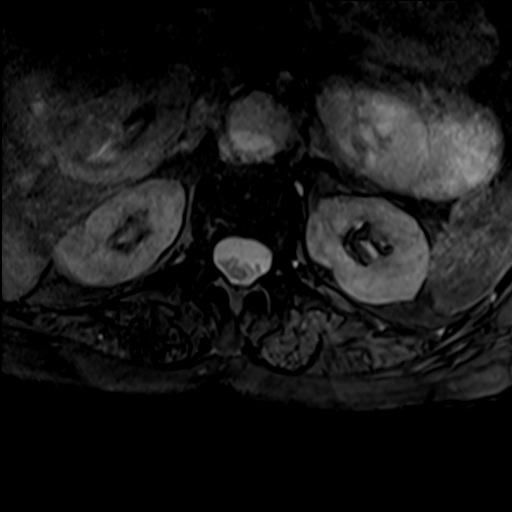

[Series 1108: T2 · axial · 4.0mm · 0.59mm/px · z∈[-345,-15]mm · 8 of 39 slices shown (3 of 3)]
[im 1/39]
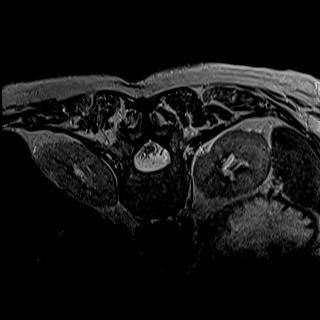
[im 6/39]
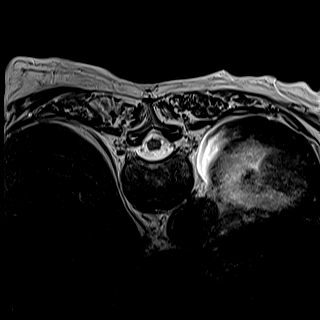
[im 11/39]
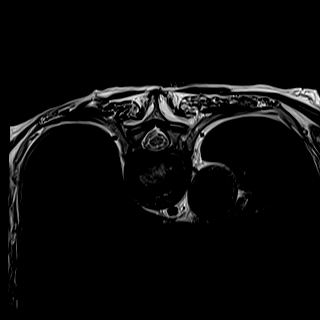
[im 17/39]
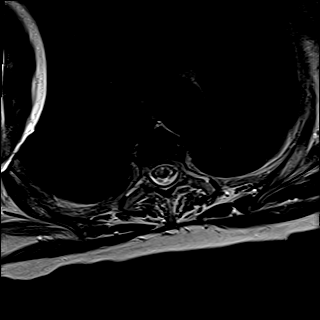
[im 22/39]
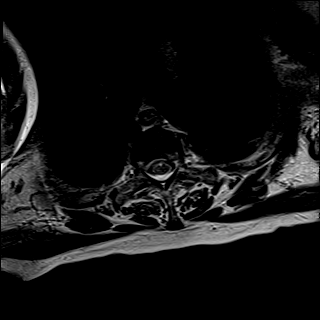
[im 28/39]
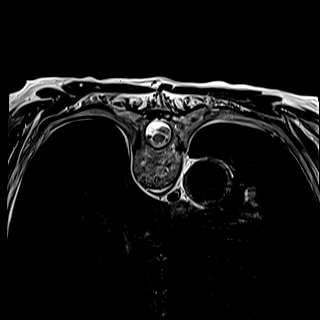
[im 33/39]
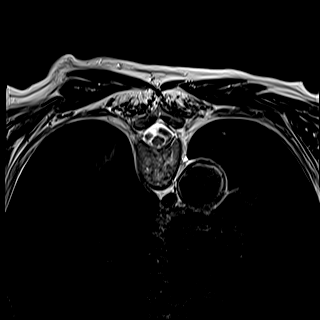
[im 39/39]
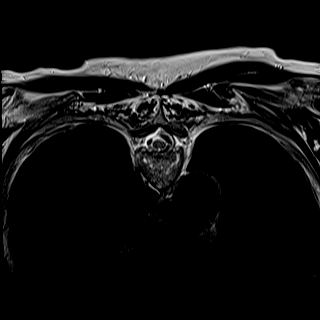

[33 of 48 positions shown; findings below may reference images not displayed]

The lumbar spine radiograph from [DATE]
is not available, but the report is available.
FINDINGS: Alignment: There is exaggerated thoracic kyphosis centered at T8.
There is dextrocurvature centered at T8.

Vertebrae: There is severe compression deformity of the T8 vertebral
body, similar to the radiographs from [DATE]. Mild compression
deformity of the T10 vertebral body with associated post kyphoplasty
changes are noted. The kyphoplasty is new since [DATE]. There
has been no significant progression of loss of vertebral body height
since the prior MRI. There is no bony retropulsion at either level.

There is mild T1 hypointensity with associated edema along the
superior endplate of the T12 vertebral body consistent with acute to
subacute fracture, though there is no significant loss of vertebral
body height. There is mild anterior compression deformity of the T11
vertebral body without edema to suggest acute fracture. There is no
bony retropulsion.

There is indentation of the superior L4 endplate, similar to the
prior lumbar spine MRI. Post kyphoplasty changes are also noted at
L2.

The other vertebral body heights are preserved. Background marrow
signal is normal. There is no suspicious marrow signal abnormality.

Cord:  Normal in signal and morphology.

Paraspinal and other soft tissues: Unremarkable.

Disc levels:

Disc heights are overall preserved in the thoracic spine. There is
overall mild multilevel degenerative endplate change and facet
arthropathy. There is no significant spinal canal or neural
foraminal stenosis. There is no evidence of cord or nerve root
impingement.

Mild degenerative changes are noted in the upper lumbar spine also
without evidence of significant spinal canal or neural foraminal
stenosis to the level imaged.
IMPRESSION: 1. Mild marrow edema along the superior T12 endplate consistent with
acute to subacute fracture but without significant loss of vertebral
body height. No bony retropulsion.
2. Mild anterior wedge deformity of the T11 vertebral body without
marrow edema to suggest acute fracture. No bony retropulsion at this
level.
3. Unchanged marked compression deformity of the T8 vertebral body
compared to the prior radiographs from [DATE].
[DATE]. Mild compression deformity of the T10 vertebral body is similar
in degree to the MRI from [DATE], with interval kyphoplasty
changes. Additional post kyphoplasty changes are noted at L2.
5. Mild degenerative changes without significant spinal canal or
neural foraminal stenosis. No cord or nerve root impingement.
6. Dextrocurvature centered and exaggerated kyphosis centered at T8.

## 2021-12-03 ENCOUNTER — Other Ambulatory Visit (HOSPITAL_COMMUNITY): Payer: Self-pay | Admitting: Interventional Radiology

## 2021-12-03 DIAGNOSIS — S22080A Wedge compression fracture of T11-T12 vertebra, initial encounter for closed fracture: Secondary | ICD-10-CM

## 2021-12-04 ENCOUNTER — Inpatient Hospital Stay: Payer: Medicare HMO | Admitting: Nurse Practitioner

## 2021-12-04 ENCOUNTER — Encounter: Payer: Self-pay | Admitting: Nurse Practitioner

## 2021-12-04 VITALS — BP 136/71 | HR 77 | Temp 97.4°F | Ht 60.0 in

## 2021-12-04 DIAGNOSIS — D72821 Monocytosis (symptomatic): Secondary | ICD-10-CM | POA: Diagnosis not present

## 2021-12-04 NOTE — Progress Notes (Signed)
[facility] ?OFFICE PROGRESS NOTE ? ?Patient Care Team: ?Dion Body, MD as PCP - General (Family Medicine) ? ?# HEMATOLOGY HISTORY: ? ?# LEUKOCYTOSIS- [FEB 2023- PCP] WBC 35 [predominant monocytosis; mild lymphocytosis; moderate neutrophilia]; [name]; platelets- 180 ? ? ?39.5 High   Smear review agrees with analyzer results, white count elevated.     ?RBC (Red Blood Cell Count) 4.04 - 5.48 10?6/uL 3.02 Low     ?Hemoglobin 12.0 - 15.0 gm/dL 10.5 Low     ?Hematocrit 35.0 - 47.0 % 31.9 Low     ?MCV (Mean Corpuscular Volume) 80.0 - 100.0 fl 105.6 High     ?MCH (Mean Corpuscular Hemoglobin) 27.0 - 31.2 pg 34.8 High     ?MCHC (Mean Corpuscular Hemoglobin Concentration) 32.0 - 36.0 gm/dL 32.9    ?Platelet Count 150 - 450 10?3/uL 180    ?RDW-CV (Red Cell Distribution Width) 11.6 - 14.8 % 14.5    ?MPV (Mean Platelet Volume) 9.4 - 12.4 fl 9.1 Low     ?Neutrophils 1.50 - 7.80 10?3/uL 16.28 High     ?Lymphocytes 1.00 - 3.60 10?3/uL 4.61 High     ?Monocytes 0.00 - 1.50 10?3/uL 16.92 High     ?Eosinophils 0.00 - 0.55 10?3/uL 0.48    ?Basophils 0.00 - 0.09 10?3/uL 0.07    ?Neutrophil % 32.0 - 70.0 % 41.2    ?Lymphocyte % 10.0 - 50.0 % 11.7    ?Monocyte % 4.0 - 13.0 % 42.8 High   See manual diff and path review.   ?Eosinophil % 1.0 - 5.0 % 1.2    ?Basophil% 0.0 - 2.0 % 0.2    ?Immature Granulocyte % <=0.7 % 2.9 High     ?Immature Granulocyte Count <=0.06 10^3/?L 1.15 High     ? ?Oncology History  ? No history exists.  ? ?In December 2022 patient had COVID; noted to have intermittent cough for which patient was treated with prednisone.  Patient noted to have elevated white count/as noted above. ? ?Night sweats: none ?Weight loss:none ?Early satiety: good.  ? ?Infections:NONE ?Splenectomy: NONE ?Steroids: recently on steroids-finished February 24.  ?Smoke: none ?Skin rash: none ?  ? ?HPI: Patient in wheelchair. kyphosis. Accompanied by daughter.  ? ?Amanda Rose 88 y.o. female pleasant patient who  returns to clinic for discussion of blood work for monocytosis and treatment planning.  In the interim, patient suffered an acute midline low back pain and MRI was performed which compression fracture of T12 vertebrae.  She is currently awaiting appointment with Ortho.  History of kyphoplasty in the past.  Rates pain 10/10 and using wheelchair. No fevers, chills, nightsweats or weight loss.  ? ?Review of Systems  ?Constitutional:  Negative for chills, diaphoresis, fever and malaise/fatigue.  ?HENT:  Negative for nosebleeds and sore throat.   ?Eyes:  Negative for double vision.  ?Respiratory:  Negative for cough, hemoptysis and wheezing.   ?Cardiovascular:  Negative for chest pain, palpitations, orthopnea and leg swelling.  ?Gastrointestinal:  Negative for abdominal pain, blood in stool, constipation, diarrhea, heartburn, melena, nausea and vomiting.  ?Genitourinary:  Negative for dysuria, frequency and urgency.  ?Musculoskeletal:  Positive for back pain. Negative for joint pain.  ?Skin: Negative.  Negative for itching and rash.  ?Neurological:  Negative for dizziness, tingling, focal weakness, weakness and headaches.  ?Endo/Heme/Allergies:  Does not bruise/bleed easily.  ?Psychiatric/Behavioral:  Negative for depression. The patient is not nervous/anxious and does not have insomnia.   ?  ? ?PAST  MEDICAL HISTORY :  ?Past Medical History:  ?Diagnosis Date  ? Hypothyroidism   ? Pre-diabetes   ? Thyroid disease   ? ? ?PAST SURGICAL HISTORY :   ?Past Surgical History:  ?Procedure Laterality Date  ? KYPHOPLASTY N/A 04/05/2021  ? Procedure: T10 and L2 KYPHOPLASTY;  Surgeon: Hessie Knows, MD;  [name];  Service: Orthopedics;  [name];  ? ? ?FAMILY HISTORY :   ?Family History  ?Problem Relation Age of Onset  ? Cancer Father   ?     unknown  ? ? ?SOCIAL HISTORY:   ?Social History  ? ?Tobacco Use  ? Smoking status: Never  ? Smokeless tobacco: Never  ?Vaping Use  ? Vaping Use: Never used  ?Substance Use  Topics  ? Alcohol use: Never  ? Drug use: Never  ? ? ?ALLERGIES:  is allergic to other. ? ?MEDICATIONS:  ?Current Outpatient Medications  ?Medication Sig Dispense Refill  ? acetaminophen (TYLENOL) 500 MG tablet Take 1,000 mg by mouth in the morning and at bedtime.    ? cetirizine (ZYRTEC) 10 MG tablet Take 10 mg by mouth in the morning.    ? Cholecalciferol (VITAMIN D-3) 125 MCG (5000 UT) TABS Take 5,000 Units by mouth in the morning.    ? Cyanocobalamin (VITAMIN B-12) 5000 MCG SUBL Take 5,000 mcg by mouth in the morning.    ? diphenhydrAMINE (SOMINEX) 25 MG tablet Take 25 mg by mouth at bedtime. Sleep Aid    ? levothyroxine (SYNTHROID) 100 MCG tablet Take 100 mcg by mouth daily before breakfast.    ? Multiple Vitamin (MULTIVITAMIN WITH MINERALS) TABS tablet Take 1 tablet by mouth in the morning. Equate Complete Multivitamin    ? Multiple Vitamins-Minerals (ALGAE BASED CALCIUM) TABS Take 2 tablets by mouth in the morning and at bedtime. AlgaeCal Plus Plant-Based Calcium Supplement with Magnesium Boron Vitamin K2 + D3    ? Multiple Vitamins-Minerals (BONE SMART PO) Take 2 tablets by mouth daily in the afternoon. Strontium Boost Natural Strontium Citrate Supplement    ? traMADol (ULTRAM) 50 MG tablet Take 50 mg by mouth every 6 (six) hours as needed.    ? ?No current facility-administered medications for this visit.  ? ? ?PHYSICAL EXAMINATION: ? ?BP 136/71 (BP Location: Left Arm, Patient Position: Sitting, Cuff Size: Small)   Pulse 77   Temp (!) 97.4 ?F (36.3 ?C) (Tympanic)   Ht 5' (1.524 m)   SpO2 95%   BMI 22.97 kg/m?  ? ?Filed Weights  ? ? ?Physical Exam ?Vitals and nursing note reviewed.  ?Constitutional:   ?   Appearance: She is not ill-appearing.  ?Eyes:  ?   General: No scleral icterus. ?Cardiovascular:  ?   Rate and Rhythm: Normal rate and regular rhythm.  ?Abdominal:  ?   General: There is no distension.  ?   Palpations: Abdomen is soft.  ?   Tenderness: There is no abdominal tenderness. There is no  guarding.  ?Musculoskeletal:     ?   General: Deformity present.  ?   Right lower leg: No edema.  ?   Left lower leg: No edema.  ?   Comments: Soft back support in place  ?Lymphadenopathy:  ?   Cervical: No cervical adenopathy.  ?Skin: ?   General: Skin is warm and dry.  ?Neurological:  ?   Mental Status: She is alert and oriented to person, place, and time. Mental status is at baseline.  ?Psychiatric:     ?  Mood and Affect: Mood normal.     ?   Behavior: Behavior normal.  ? ? ?LABORATORY DATA:  ?I have reviewed the data as listed ?   ?Component Value Date/Time  ? NA 134 (L) 11/20/2021 1438  ? K 4.2 11/20/2021 1438  ? CL 100 11/20/2021 1438  ? CO2 27 11/20/2021 1438  ? GLUCOSE 109 (H) 11/20/2021 1438  ? BUN 20 11/20/2021 1438  ? CREATININE 1.25 (H) 11/20/2021 1438  ? CALCIUM 9.0 11/20/2021 1438  ? PROT 7.2 11/20/2021 1438  ? ALBUMIN 4.0 11/20/2021 1438  ? AST 20 11/20/2021 1438  ? ALT 14 11/20/2021 1438  ? ALKPHOS 108 11/20/2021 1438  ? BILITOT 0.2 (L) 11/20/2021 1438  ? GFRNONAA 42 (L) 11/20/2021 1438  ? GFRAA >60 12/03/2017 1003  ? ? ?No results found for: SPEP, UPEP ? ?Lab Results  ?Component Value Date  ? WBC 17.1 (H) 11/20/2021  ? NEUTROABS 7.4 11/20/2021  ? HGB 10.4 (L) 11/20/2021  ? HCT 31.0 (L) 11/20/2021  ? MCV 106.2 (H) 11/20/2021  ? PLT 237 11/20/2021  ? ? ?  Chemistry   ?   ?Component Value Date/Time  ? NA 134 (L) 11/20/2021 1438  ? K 4.2 11/20/2021 1438  ? CL 100 11/20/2021 1438  ? CO2 27 11/20/2021 1438  ? BUN 20 11/20/2021 1438  ? CREATININE 1.25 (H) 11/20/2021 1438  ?    ?Component Value Date/Time  ? CALCIUM 9.0 11/20/2021 1438  ? ALKPHOS 108 11/20/2021 1438  ? AST 20 11/20/2021 1438  ? ALT 14 11/20/2021 1438  ? BILITOT 0.2 (L) 11/20/2021 1438  ?  ? ?11/20/21- Path Review ?Leukocytosis, with absolute monocytosis.  ?No circulating blasts identified.  ?Granulocytes appear without significant atypia, displaying appropriate nuclear lobation and cytoplasmic granularity.  ?Macrocytic anemia, without  significant anisopoikilocytosis.  ?No increase in teardrop cells.  ?Normal platelet count and morphology.  ?The cause of these changes is unclear from morphologic review alone.  ?Potential causes of monocytosis include

## 2021-12-06 ENCOUNTER — Ambulatory Visit (HOSPITAL_COMMUNITY)
Admission: RE | Admit: 2021-12-06 | Discharge: 2021-12-06 | Disposition: A | Discharge status: Home / Self Care | Payer: Medicare HMO | Source: Ambulatory Visit | Attending: Interventional Radiology | Admitting: Interventional Radiology

## 2021-12-06 DIAGNOSIS — S22080A Wedge compression fracture of T11-T12 vertebra, initial encounter for closed fracture: Secondary | ICD-10-CM

## 2021-12-09 HISTORY — PX: IR RADIOLOGIST EVAL & MGMT: IMG5224

## 2021-12-13 ENCOUNTER — Other Ambulatory Visit (HOSPITAL_COMMUNITY): Payer: Self-pay | Admitting: Interventional Radiology

## 2021-12-13 DIAGNOSIS — S22080A Wedge compression fracture of T11-T12 vertebra, initial encounter for closed fracture: Secondary | ICD-10-CM

## 2021-12-16 ENCOUNTER — Other Ambulatory Visit: Payer: Self-pay | Admitting: Internal Medicine

## 2021-12-16 ENCOUNTER — Telehealth: Payer: Self-pay

## 2021-12-16 DIAGNOSIS — D72821 Monocytosis (symptomatic): Secondary | ICD-10-CM

## 2021-12-16 NOTE — Telephone Encounter (Signed)
Order faxed.

## 2021-12-16 NOTE — Telephone Encounter (Signed)
Patient calling the office requesting to have her bone marrow biopsy scheduled.  Was seen by Ander Purpura, NP on 12/04/21 with check out to schedule bone marrow biopsy and see Dr. B 1 week after BMB. ? ?OK to proceed with scheduling BMB? ?

## 2021-12-17 ENCOUNTER — Ambulatory Visit (HOSPITAL_COMMUNITY)
Admission: RE | Admit: 2021-12-17 | Discharge: 2021-12-17 | Disposition: A | Discharge status: Home / Self Care | Payer: Medicare HMO | Source: Ambulatory Visit | Attending: Interventional Radiology | Admitting: Interventional Radiology

## 2021-12-17 ENCOUNTER — Other Ambulatory Visit: Payer: Self-pay

## 2021-12-17 ENCOUNTER — Encounter (HOSPITAL_COMMUNITY): Payer: Self-pay

## 2021-12-17 ENCOUNTER — Other Ambulatory Visit: Payer: Self-pay | Admitting: Radiology

## 2021-12-17 DIAGNOSIS — S22080A Wedge compression fracture of T11-T12 vertebra, initial encounter for closed fracture: Secondary | ICD-10-CM | POA: Insufficient documentation

## 2021-12-17 DIAGNOSIS — X58XXXA Exposure to other specified factors, initial encounter: Secondary | ICD-10-CM | POA: Insufficient documentation

## 2021-12-17 DIAGNOSIS — M549 Dorsalgia, unspecified: Secondary | ICD-10-CM | POA: Insufficient documentation

## 2021-12-17 HISTORY — PX: IR KYPHO THORACIC WITH BONE BIOPSY: IMG5518

## 2021-12-17 HISTORY — PX: IR KYPHO EA ADDL LEVEL THORACIC OR LUMBAR: IMG5520

## 2021-12-17 LAB — CBC WITH DIFFERENTIAL/PLATELET
Abs Immature Granulocytes: 0.12 10*3/uL — ABNORMAL HIGH (ref 0.00–0.07)
Basophils Absolute: 0 10*3/uL (ref 0.0–0.1)
Basophils Relative: 0 %
Eosinophils Absolute: 0.2 10*3/uL (ref 0.0–0.5)
Eosinophils Relative: 2 %
HCT: 30.5 % — ABNORMAL LOW (ref 36.0–46.0)
Hemoglobin: 10.2 g/dL — ABNORMAL LOW (ref 12.0–15.0)
Immature Granulocytes: 1 %
Lymphocytes Relative: 20 %
Lymphs Abs: 2.9 10*3/uL (ref 0.7–4.0)
MCH: 35.9 pg — ABNORMAL HIGH (ref 26.0–34.0)
MCHC: 33.4 g/dL (ref 30.0–36.0)
MCV: 107.4 fL — ABNORMAL HIGH (ref 80.0–100.0)
Monocytes Absolute: 6.3 10*3/uL — ABNORMAL HIGH (ref 0.1–1.0)
Monocytes Relative: 41 %
Neutro Abs: 5.4 10*3/uL (ref 1.7–7.7)
Neutrophils Relative %: 36 %
Platelets: 176 10*3/uL (ref 150–400)
RBC: 2.84 MIL/uL — ABNORMAL LOW (ref 3.87–5.11)
RDW: 14.7 % (ref 11.5–15.5)
WBC: 15.1 10*3/uL — ABNORMAL HIGH (ref 4.0–10.5)
nRBC: 0 % (ref 0.0–0.2)

## 2021-12-17 LAB — BASIC METABOLIC PANEL
Anion gap: 8 (ref 5–15)
BUN: 24 mg/dL — ABNORMAL HIGH (ref 8–23)
CO2: 27 mmol/L (ref 22–32)
Calcium: 9.1 mg/dL (ref 8.9–10.3)
Chloride: 105 mmol/L (ref 98–111)
Creatinine, Ser: 1 mg/dL (ref 0.44–1.00)
GFR, Estimated: 54 mL/min — ABNORMAL LOW (ref >60)
Glucose, Bld: 88 mg/dL (ref 70–99)
Potassium: 4 mmol/L (ref 3.5–5.1)
Sodium: 140 mmol/L (ref 135–145)

## 2021-12-17 LAB — PROTIME-INR
INR: 1 (ref 0.8–1.2)
Prothrombin Time: 13.3 seconds (ref 11.4–15.2)

## 2021-12-17 MED ORDER — MIDAZOLAM HCL 2 MG/2ML IJ SOLN
INTRAMUSCULAR | Status: AC | PRN
  Administered 2021-12-17: 1 mg via INTRAVENOUS
  Administered 2021-12-17: .5 mg via INTRAVENOUS

## 2021-12-17 MED ORDER — MIDAZOLAM HCL 2 MG/2ML IJ SOLN
INTRAMUSCULAR | Status: AC
  Filled 2021-12-17: qty 2

## 2021-12-17 MED ORDER — BUPIVACAINE HCL (PF) 0.5 % IJ SOLN
INTRAMUSCULAR | Status: AC
  Filled 2021-12-17: qty 30

## 2021-12-17 MED ORDER — SODIUM CHLORIDE 0.9 % IV SOLN: INTRAVENOUS | Status: AC

## 2021-12-17 MED ORDER — TOBRAMYCIN SULFATE 1.2 G IJ SOLR
INTRAMUSCULAR | Status: AC
  Filled 2021-12-17: qty 1.2

## 2021-12-17 MED ORDER — CEFAZOLIN SODIUM-DEXTROSE 2-4 GM/100ML-% IV SOLN
INTRAVENOUS | Status: AC
  Administered 2021-12-17: 2 g via INTRAVENOUS
  Filled 2021-12-17: qty 100

## 2021-12-17 MED ORDER — FENTANYL CITRATE (PF) 100 MCG/2ML IJ SOLN
INTRAMUSCULAR | Status: AC | PRN
Start: 2021-12-17 — End: 2021-12-17
  Administered 2021-12-17: 25 ug via INTRAVENOUS

## 2021-12-17 MED ORDER — SODIUM CHLORIDE 0.9 % IV SOLN: INTRAVENOUS | Status: DC

## 2021-12-17 MED ORDER — FENTANYL CITRATE (PF) 100 MCG/2ML IJ SOLN
INTRAMUSCULAR | Status: AC
  Filled 2021-12-17: qty 2

## 2021-12-17 MED ORDER — IOHEXOL 300 MG/ML  SOLN
100.0000 mL | Freq: Once | INTRAMUSCULAR | Status: AC | PRN
  Administered 2021-12-17: 10 mL

## 2021-12-17 MED ORDER — CEFAZOLIN SODIUM-DEXTROSE 2-4 GM/100ML-% IV SOLN: 2.0000 g | INTRAVENOUS | Status: AC

## 2021-12-17 NOTE — H&P (Addendum)
? ?Chief Complaint: ?Patient was seen in consultation today for T11 and 12 Kyphoplasty at the request of [facility] ? ? ?Supervising Physician: Luanne Bras ? ?Patient Status: [facility] - Out-pt ? ?History of Present Illness: ?Amanda Rose is a 88 y.o. female  ? ?Severe back pain x 1 month ?Meds no help ?Bent to clip toenails and when stood up-- pain immediately ?Previous KPs T10 and L2 per imaging ?(Outside facility) ?Great pain relief per pt ? ?MR 11/29/21: IMPRESSION: ?1. Mild marrow edema along the superior T12 endplate consistent with ?acute to subacute fracture but without significant loss of vertebral ?body height. No bony retropulsion. ?2. Mild anterior wedge deformity of the T11 vertebral body without ?marrow edema to suggest acute fracture. No bony retropulsion at this ?level. ?3. Unchanged marked compression deformity of the T8 vertebral body ?compared to the prior radiographs from 12/20/2019. ?4. Mild compression deformity of the T10 vertebral body is similar ?in degree to the MRI from 03/20/2021, with interval kyphoplasty ?changes. Additional post kyphoplasty changes are noted at L2. ?5. Mild degenerative changes without significant spinal canal or ?neural foraminal stenosis. No cord or nerve root impingement. ?6. Dextrocurvature centered and exaggerated kyphosis centered at T8. ?  ?Request per MD for Kyphoplasty for pain relief ?Dr Estanislado Pandy has seen pt in consult 12/06/21 ?He has reviewed imaging and approves procedure ? ? ? ?Past Medical History:  ?Diagnosis Date  ? Hypothyroidism   ? Pre-diabetes   ? Thyroid disease   ? ? ?Past Surgical History:  ?Procedure Laterality Date  ? IR RADIOLOGIST EVAL & MGMT  12/09/2021  ? KYPHOPLASTY N/A 04/05/2021  ? Procedure: T10 and L2 KYPHOPLASTY;  Surgeon: Hessie Knows, MD;  [name];  Service: Orthopedics;  [name];  ? ? ?Allergies: ?Other ? ?Medications: ?Prior to Admission medications   ?Medication Sig Start Date End Date Taking?  Authorizing Provider  ?Cholecalciferol (VITAMIN D-3) 125 MCG (5000 UT) TABS Take 5,000 Units by mouth in the morning.   Yes [provider]  ?Cyanocobalamin (VITAMIN B-12) 5000 MCG SUBL Take 5,000 mcg by mouth in the morning.   Yes [provider]  ?diphenhydrAMINE (SOMINEX) 25 MG tablet Take 25 mg by mouth at bedtime as needed for sleep. Sleep Aid   Yes [provider]  ?ibuprofen (ADVIL) 200 MG tablet Take 200-400 mg by mouth every 4 (four) hours as needed for mild pain.   Yes [provider]  ?levothyroxine (SYNTHROID) 100 MCG tablet Take 100 mcg by mouth daily before breakfast. 03/16/21  Yes [provider]  ?Multiple Vitamin (MULTIVITAMIN WITH MINERALS) TABS tablet Take 1 tablet by mouth in the morning. Equate Complete Multivitamin   Yes [provider]  ?Multiple Vitamins-Minerals (ALGAE BASED CALCIUM) TABS Take 2 tablets by mouth in the morning and at bedtime. AlgaeCal Plus Plant-Based Calcium Supplement with Magnesium Boron Vitamin K2 + D3   Yes [provider]  ?Multiple Vitamins-Minerals (BONE SMART PO) Take 2 tablets by mouth daily in the afternoon. Strontium Boost Natural Strontium Citrate Supplement   Yes [provider]  ?traMADol (ULTRAM) 50 MG tablet Take 50 mg by mouth every 6 (six) hours as needed for moderate pain.   Yes [provider]  ?cetirizine (ZYRTEC) 10 MG tablet Take 10 mg by mouth in the morning.    [provider]  ?  ? ?Family History  ?Problem Relation Age of Onset  ? Cancer Father   ?     unknown  ? ? ?  Social History  ? ?Socioeconomic History  ? Marital status: Widowed  ?  Spouse name: Not on file  ? Number of children: Not on file  ? Years of education: Not on file  ? Highest education level: Not on file  ?Occupational History  ? Not on file  ?Tobacco Use  ? Smoking status: Never  ? Smokeless tobacco: Never  ?Vaping Use  ? Vaping Use: Never used  ?Substance and Sexual Activity  ? Alcohol use:  Never  ? Drug use: Never  ? Sexual activity: Not on file  ?Other Topics Concern  ? Not on file  ?Social History Narrative  ? Lives with other family   ? ?Social Determinants of Health  ? ?Financial Resource Strain: Not on file  ?Food Insecurity: Not on file  ?Transportation Needs: Not on file  ?Physical Activity: Not on file  ?Stress: Not on file  ?Social Connections: Not on file  ? ? ?Review of Systems: A 12 point ROS discussed and pertinent positives are indicated in the HPI above.  All other systems are negative. ? ?Review of Systems  ?Constitutional:  Positive for activity change. Negative for fatigue, fever and unexpected weight change.  ?Respiratory:  Negative for cough and shortness of breath.   ?Cardiovascular:  Negative for chest pain.  ?Gastrointestinal:  Negative for abdominal pain.  ?Musculoskeletal:  Positive for back pain and gait problem.  ?Neurological:  Negative for weakness.  ?Psychiatric/Behavioral:  Negative for behavioral problems and confusion.   ? ?Vital Signs: ?BP (!) 148/63   Pulse 72   Temp 97.8 ?F (36.6 ?C) (Oral)   Ht '4\' 11"'$  (1.499 m)   Wt 116 lb (52.6 kg)   SpO2 93%   BMI 23.43 kg/m?  ? ?Physical Exam ?Vitals reviewed.  ?HENT:  ?   Mouth/Throat:  ?   Mouth: Mucous membranes are moist.  ?Cardiovascular:  ?   Rate and Rhythm: Normal rate and regular rhythm.  ?   Heart sounds: Normal heart sounds.  ?Pulmonary:  ?   Effort: Pulmonary effort is normal.  ?   Breath sounds: Normal breath sounds.  ?Abdominal:  ?   Palpations: Abdomen is soft.  ?Musculoskeletal:     ?   General: Normal range of motion.  ?   Comments: Low back pain  ?Skin: ?   General: Skin is warm.  ?Neurological:  ?   Mental Status: She is alert and oriented to person, place, and time.  ?Psychiatric:     ?   Behavior: Behavior normal.  ? ? ?Imaging: ?MR THORACIC SPINE WO CONTRAST ? ?Result Date: 11/29/2021 ?CLINICAL DATA:  Fall last year, kyphoplasty in July 2022. Mid to lower back pain with radiographs showing T11  fracture. EXAM: MRI THORACIC SPINE WITHOUT CONTRAST TECHNIQUE: Multiplanar, multisequence MR imaging of the thoracic spine was performed. No intravenous contrast was administered. COMPARISON:  Lumbar spine radiographs 03/20/2021, thoracic spine radiographs 12/20/2019. The lumbar spine radiograph from 11/28/2021 is not available, but the report is available. FINDINGS: Alignment: There is exaggerated thoracic kyphosis centered at T8. There is dextrocurvature centered at T8. Vertebrae: There is severe compression deformity of the T8 vertebral body, similar to the radiographs from 12/20/2019. Mild compression deformity of the T10 vertebral body with associated post kyphoplasty changes are noted. The kyphoplasty is new since 03/20/2021. There has been no significant progression of loss of vertebral body height since the prior MRI. There is no bony retropulsion at either level. There is mild T1 hypointensity with associated edema along  the superior endplate of the J62 vertebral body consistent with acute to subacute fracture, though there is no significant loss of vertebral body height. There is mild anterior compression deformity of the T11 vertebral body without edema to suggest acute fracture. There is no bony retropulsion. There is indentation of the superior L4 endplate, similar to the prior lumbar spine MRI. Post kyphoplasty changes are also noted at L2. The other vertebral body heights are preserved. Background marrow signal is normal. There is no suspicious marrow signal abnormality. Cord:  Normal in signal and morphology. Paraspinal and other soft tissues: Unremarkable. Disc levels: Disc heights are overall preserved in the thoracic spine. There is overall mild multilevel degenerative endplate change and facet arthropathy. There is no significant spinal canal or neural foraminal stenosis. There is no evidence of cord or nerve root impingement. Mild degenerative changes are noted in the upper lumbar spine also  without evidence of significant spinal canal or neural foraminal stenosis to the level imaged. IMPRESSION: 1. Mild marrow edema along the superior T12 endplate consistent with acute to subacute fracture but without significan

## 2021-12-17 NOTE — Discharge Instructions (Signed)
1.  No stooping bending or lifting more than 10 pounds for 2 weeks. ? ?2.  Use walker to ambulate for the next 2 weeks. ? ?3.  No driving for 2 weeks. ? ?4.  Return to clinic as needed in 2 weeks. ? ?Arlean Hopping MD ?

## 2021-12-17 NOTE — Procedures (Signed)
INR. ?S/P T11 and T 12 Balloon KP ?S.Shinika Estelle MD ?

## 2021-12-18 ENCOUNTER — Other Ambulatory Visit: Payer: Self-pay

## 2021-12-18 DIAGNOSIS — D72821 Monocytosis (symptomatic): Secondary | ICD-10-CM

## 2021-12-18 NOTE — Telephone Encounter (Signed)
Patient is scheduled for bone marrow biopsy on 12/27/21 arrive at 8:30 am for 9:30 appointment.   ? ?Please schedule patient to see MD 1 week later and I will inform her of both appts.   ?

## 2021-12-18 NOTE — Telephone Encounter (Signed)
Patient aware of appts.   ?

## 2021-12-24 ENCOUNTER — Telehealth: Payer: Self-pay | Admitting: *Deleted

## 2021-12-24 NOTE — Progress Notes (Signed)
Spoke with patient 12/24/21 @ 1:05 pm. Amanda Rose over pre-procedure instructions to include need to arrive at 8:30 for 9:30 procedure, need to be NPO at midnight on night prior to procedure, need to hold Advil morning of procedure and need for driver post procedure. Patient verbalized understanding. ?

## 2021-12-24 NOTE — Telephone Encounter (Signed)
Call returned to [place] and informed of doctor response. She wanted Dr B to contact IR to inform them of patient specific needs. I explained to her that IR will look completely into patient chart  and that she can speak to them before her procedure. She was asking if possible to speak with them before hand and to possibly have her completely put under anesthesia. I told her that anesthesia is based on comorbidities and age and I gave her the phone number to radiology to call and ask to speak with someone in IR. She was thankful for that and will call them ?

## 2021-12-24 NOTE — Telephone Encounter (Signed)
[name] called asking if patient can have extra numbing medicine or anesthesia when she has the BMBX done. Please advise ?

## 2021-12-26 ENCOUNTER — Other Ambulatory Visit: Payer: Self-pay | Admitting: Physician Assistant

## 2021-12-27 ENCOUNTER — Ambulatory Visit
Admission: RE | Admit: 2021-12-27 | Discharge: 2021-12-27 | Disposition: A | Discharge status: Home / Self Care | Payer: Medicare HMO | Source: Ambulatory Visit | Attending: Internal Medicine | Admitting: Internal Medicine

## 2021-12-27 ENCOUNTER — Other Ambulatory Visit: Payer: Self-pay

## 2021-12-27 DIAGNOSIS — Z87310 Personal history of (healed) osteoporosis fracture: Secondary | ICD-10-CM | POA: Insufficient documentation

## 2021-12-27 DIAGNOSIS — I10 Essential (primary) hypertension: Secondary | ICD-10-CM | POA: Insufficient documentation

## 2021-12-27 DIAGNOSIS — M4854XA Collapsed vertebra, not elsewhere classified, thoracic region, initial encounter for fracture: Secondary | ICD-10-CM | POA: Diagnosis not present

## 2021-12-27 DIAGNOSIS — R7303 Prediabetes: Secondary | ICD-10-CM | POA: Diagnosis not present

## 2021-12-27 DIAGNOSIS — D72829 Elevated white blood cell count, unspecified: Secondary | ICD-10-CM | POA: Diagnosis not present

## 2021-12-27 DIAGNOSIS — E039 Hypothyroidism, unspecified: Secondary | ICD-10-CM | POA: Diagnosis not present

## 2021-12-27 DIAGNOSIS — Q998 Other specified chromosome abnormalities: Secondary | ICD-10-CM | POA: Diagnosis not present

## 2021-12-27 DIAGNOSIS — D7589 Other specified diseases of blood and blood-forming organs: Secondary | ICD-10-CM | POA: Insufficient documentation

## 2021-12-27 DIAGNOSIS — D72821 Monocytosis (symptomatic): Secondary | ICD-10-CM | POA: Insufficient documentation

## 2021-12-27 DIAGNOSIS — D61818 Other pancytopenia: Secondary | ICD-10-CM | POA: Diagnosis not present

## 2021-12-27 DIAGNOSIS — D539 Nutritional anemia, unspecified: Secondary | ICD-10-CM | POA: Insufficient documentation

## 2021-12-27 LAB — CBC WITH DIFFERENTIAL/PLATELET
Abs Immature Granulocytes: 0.14 10*3/uL — ABNORMAL HIGH (ref 0.00–0.07)
Basophils Absolute: 0 10*3/uL (ref 0.0–0.1)
Basophils Relative: 0 %
Eosinophils Absolute: 0.2 10*3/uL (ref 0.0–0.5)
Eosinophils Relative: 1 %
HCT: 29.6 % — ABNORMAL LOW (ref 36.0–46.0)
Hemoglobin: 9.8 g/dL — ABNORMAL LOW (ref 12.0–15.0)
Immature Granulocytes: 1 %
Lymphocytes Relative: 16 %
Lymphs Abs: 2.6 10*3/uL (ref 0.7–4.0)
MCH: 35.5 pg — ABNORMAL HIGH (ref 26.0–34.0)
MCHC: 33.1 g/dL (ref 30.0–36.0)
MCV: 107.2 fL — ABNORMAL HIGH (ref 80.0–100.0)
Monocytes Absolute: 6.2 10*3/uL — ABNORMAL HIGH (ref 0.1–1.0)
Monocytes Relative: 39 %
Neutro Abs: 6.7 10*3/uL (ref 1.7–7.7)
Neutrophils Relative %: 43 %
Platelets: 157 10*3/uL (ref 150–400)
RBC: 2.76 MIL/uL — ABNORMAL LOW (ref 3.87–5.11)
RDW: 14.6 % (ref 11.5–15.5)
WBC: 15.8 10*3/uL — ABNORMAL HIGH (ref 4.0–10.5)
nRBC: 0 % (ref 0.0–0.2)

## 2021-12-27 IMAGING — CT CT BIOPSY AND ASPIRATION BONE MARROW
1 of 2 series · 11 of 14 positions shown, 14 images · non-contrast
Comparison: none

INDICATION: Monocytosis, pancytopenia

[Series 2: i-spiral 5.0 br38 · axial · 0.74mm/px · z∈[+1025,+1158]mm · 11 of 46 slices shown, 14 images]
[im 4/46  soft-tissue]
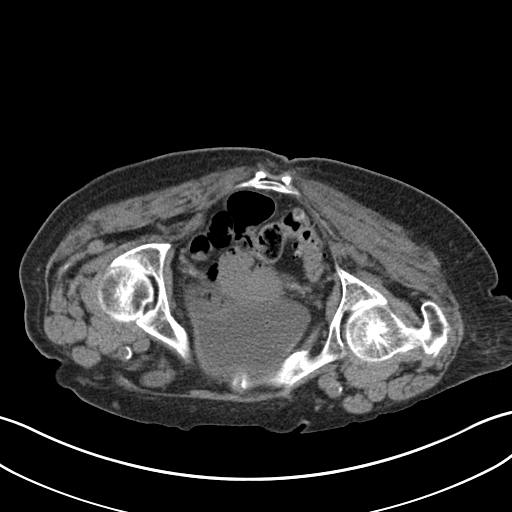
[im 4/46  bone]
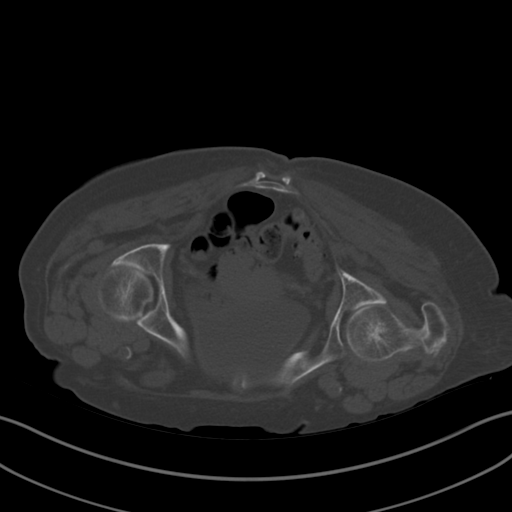
[im 8/46  bone]
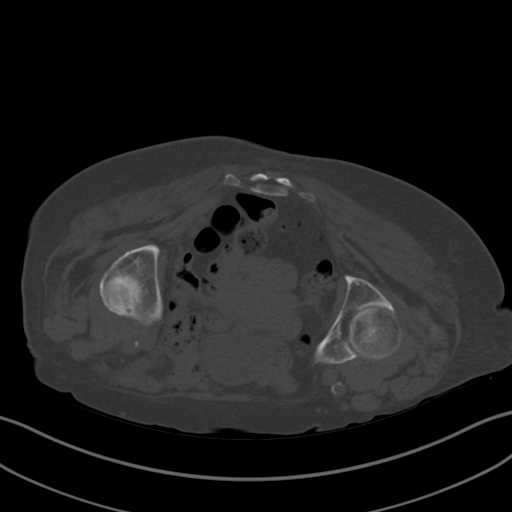
[im 12/46  bone]
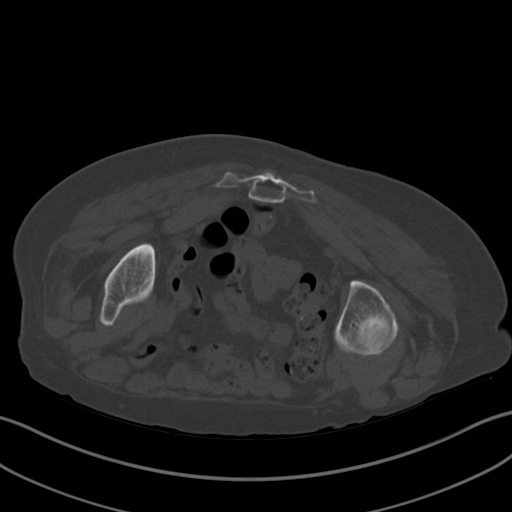
[im 16/46  bone]
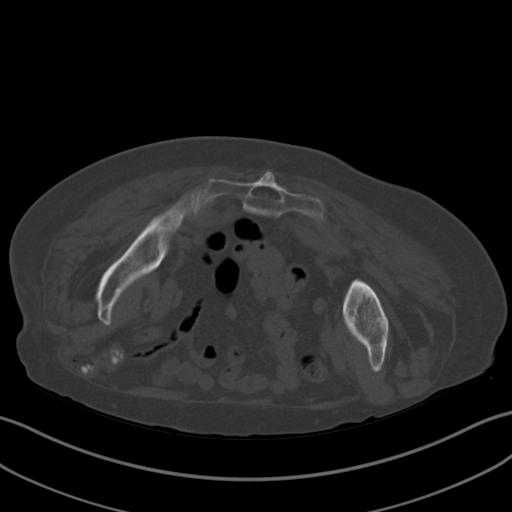
[im 19/46  soft-tissue]
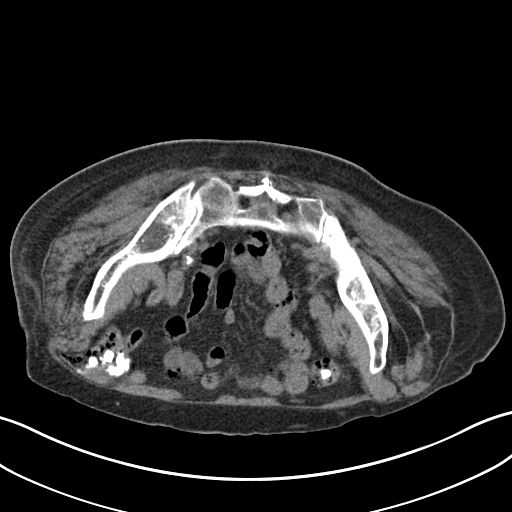
[im 19/46  bone]
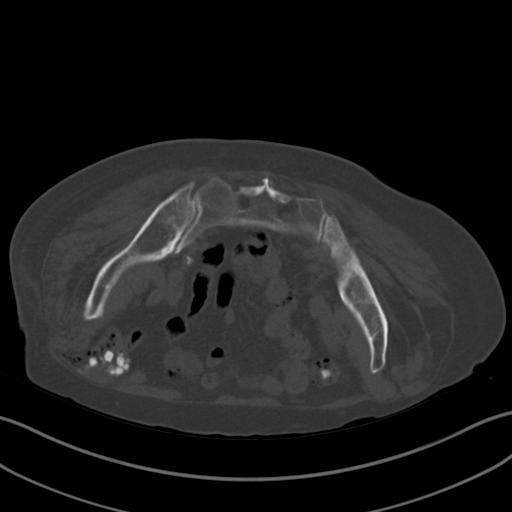
[im 23/46  bone]
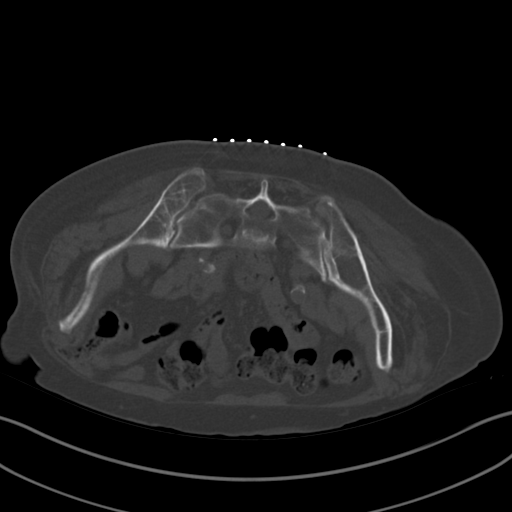
[im 27/46  bone]
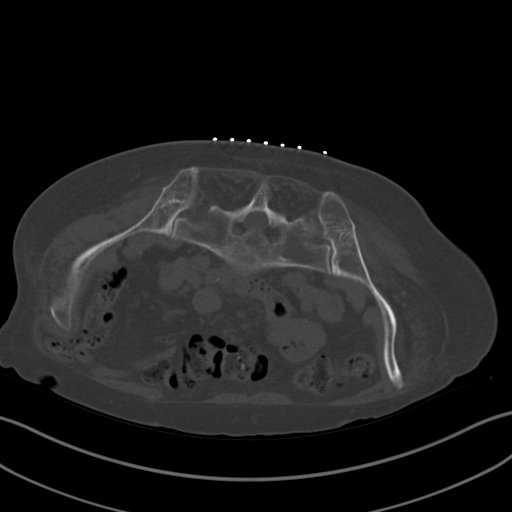
[im 31/46  bone]
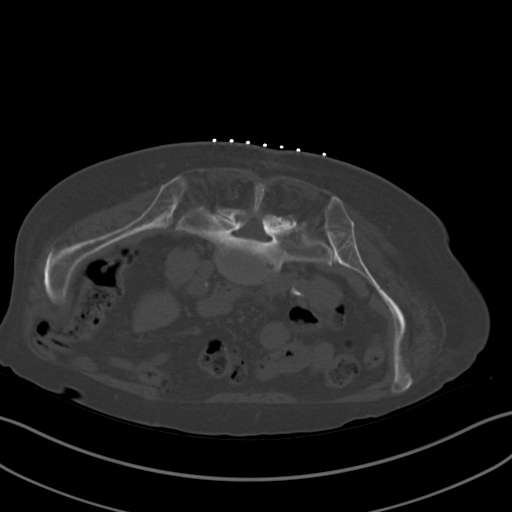
[im 34/46  soft-tissue]
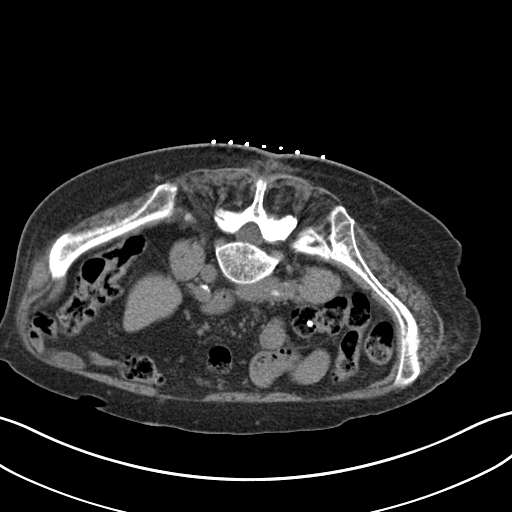
[im 34/46  bone]
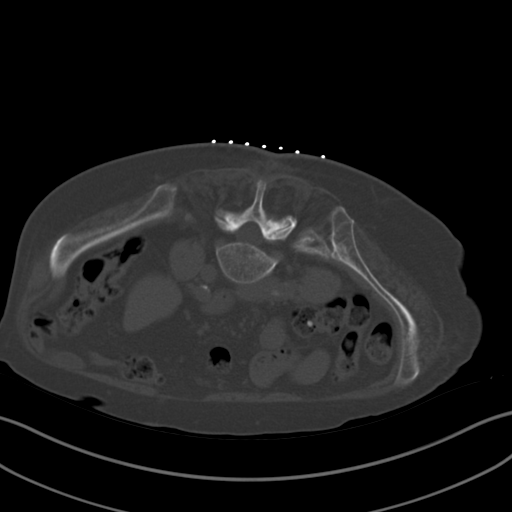
[im 38/46  bone]
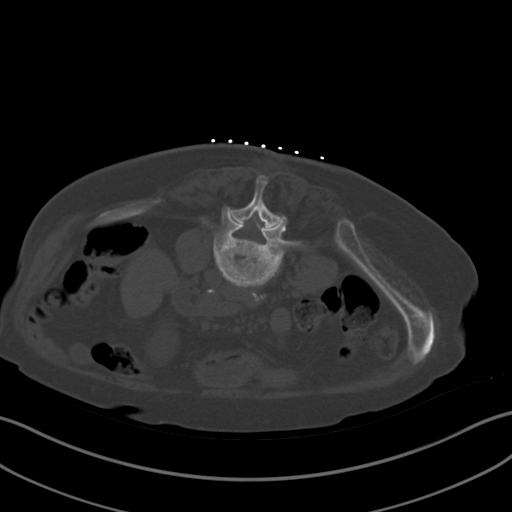
[im 42/46  bone]
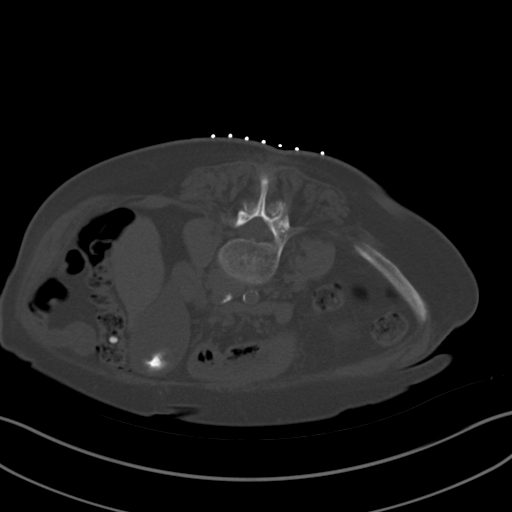

[11 of 14 positions shown; findings below may reference images not displayed]

EXAM:
CT GUIDED BONE MARROW ASPIRATION AND CORE BIOPSY

MEDICATIONS:
None.

ANESTHESIA/SEDATION:
None recorded.

FLUOROSCOPY:
None.

COMPLICATIONS:
None immediate.

Estimated blood loss: <25 mL

PROCEDURE:
Informed written consent was obtained from the patient after a
thorough discussion of the procedural risks, benefits and
alternatives. All questions were addressed. Maximal Sterile Barrier
Technique was utilized including caps, mask, sterile gowns, sterile
gloves, sterile drape, hand hygiene and skin antiseptic. A timeout
was performed prior to the initiation of the procedure.

The patient was positioned prone and non-contrast localization CT
was performed of the pelvis to demonstrate the iliac marrow spaces.

Maximal barrier sterile technique utilized including caps, mask,
sterile gowns, sterile gloves, large sterile drape, hand hygiene,
and betadine prep.

Under sterile conditions and local anesthesia, an 11 gauge coaxial
bone biopsy needle was advanced into the right iliac marrow space.
Needle position was confirmed with CT imaging. Initially, bone
marrow aspiration was performed. Next, the 11 gauge outer cannula
was utilized to obtain a right iliac bone marrow core biopsy. Needle
was removed. Hemostasis was obtained with compression. The patient
tolerated the procedure well. Samples were prepared with the
cytotechnologist.
IMPRESSION: Successful right iliac bone marrow aspiration and core biopsy.

## 2021-12-27 MED ORDER — FENTANYL CITRATE (PF) 100 MCG/2ML IJ SOLN
INTRAMUSCULAR | Status: AC | PRN
  Administered 2021-12-27: 25 ug via INTRAVENOUS

## 2021-12-27 MED ORDER — SODIUM CHLORIDE 0.9 % IV SOLN: INTRAVENOUS | Status: DC

## 2021-12-27 MED ORDER — FENTANYL CITRATE (PF) 100 MCG/2ML IJ SOLN
INTRAMUSCULAR | Status: AC
  Filled 2021-12-27: qty 2

## 2021-12-27 MED ORDER — MIDAZOLAM HCL 2 MG/2ML IJ SOLN
INTRAMUSCULAR | Status: AC | PRN
  Administered 2021-12-27: .5 mg via INTRAVENOUS

## 2021-12-27 MED ORDER — MIDAZOLAM HCL 2 MG/2ML IJ SOLN
INTRAMUSCULAR | Status: AC
  Filled 2021-12-27: qty 4

## 2021-12-27 MED ORDER — HEPARIN SOD (PORK) LOCK FLUSH 100 UNIT/ML IV SOLN
INTRAVENOUS | Status: AC
  Filled 2021-12-27: qty 5

## 2021-12-27 NOTE — Procedures (Signed)
Interventional Radiology Procedure Note  Procedure: CT guided aspirate and core biopsy of right iliac bone Complications: None Recommendations: - Bedrest supine x 1 hrs - Hydrocodone PRN  Pain - Follow biopsy results  Signed,  Hailea Eaglin K. Demetrice Combes, MD   

## 2021-12-27 NOTE — H&P (Signed)
? ?Chief Complaint: ?Monocytosis. Request is for bone marrow biopsy. ? ?Referring Physician(s): ?Brahmanday,Govinda R ? ?Supervising Physician: Jacqulynn Cadet ? ?Patient Status: [place] ? ?History of Present Illness: ?Amanda Rose is a 88 y.o. female outpatient. History of hypothyroidism, HTN, prediabetes osteoporosis, compression fractures, anemia.  Found to have leukocytosis during routine blood work.Team is requesting a bone marrow biopsy for further evaluation of monocystosis.  ? ?Currently without any significant complaints. Patient alert and laying in bed, calm and comfortable. Denies any fevers, headache, chest pain, SOB, cough, abdominal pain, nausea, vomiting or bleeding. Return precautions and treatment recommendations and follow-up discussed with the patient  and her daughter who is at bedside.  Both who are agreeable with the plan. ? ? ? ?Past Medical History:  ?Diagnosis Date  ? Hypothyroidism   ? Pre-diabetes   ? Thyroid disease   ? ? ?Past Surgical History:  ?Procedure Laterality Date  ? IR KYPHO EA ADDL LEVEL THORACIC OR LUMBAR  12/17/2021  ? IR KYPHO THORACIC WITH BONE BIOPSY  12/17/2021  ? IR RADIOLOGIST EVAL & MGMT  12/09/2021  ? KYPHOPLASTY N/A 04/05/2021  ? Procedure: T10 and L2 KYPHOPLASTY;  Surgeon: Hessie Knows, MD;  [name];  Service: Orthopedics;  [name];  ? ? ?Allergies: ?Other ? ?Medications: ?Prior to Admission medications   ?Medication Sig Start Date End Date Taking? Authorizing Provider  ?cetirizine (ZYRTEC) 10 MG tablet Take 10 mg by mouth in the morning.    [provider]  ?Cholecalciferol (VITAMIN D-3) 125 MCG (5000 UT) TABS Take 5,000 Units by mouth in the morning.    [provider]  ?Cyanocobalamin (VITAMIN B-12) 5000 MCG SUBL Take 5,000 mcg by mouth in the morning.    [provider]  ?diphenhydrAMINE (SOMINEX) 25 MG tablet Take 25 mg by mouth at bedtime as needed for sleep. Sleep Aid    [provider]   ?ibuprofen (ADVIL) 200 MG tablet Take 200-400 mg by mouth every 4 (four) hours as needed for mild pain.    [provider]  ?levothyroxine (SYNTHROID) 100 MCG tablet Take 100 mcg by mouth daily before breakfast. 03/16/21   [provider]  ?Multiple Vitamin (MULTIVITAMIN WITH MINERALS) TABS tablet Take 1 tablet by mouth in the morning. Equate Complete Multivitamin    [provider]  ?Multiple Vitamins-Minerals (ALGAE BASED CALCIUM) TABS Take 2 tablets by mouth in the morning and at bedtime. AlgaeCal Plus Plant-Based Calcium Supplement with Magnesium Boron Vitamin K2 + D3    [provider]  ?Multiple Vitamins-Minerals (BONE SMART PO) Take 2 tablets by mouth daily in the afternoon. Strontium Boost Natural Strontium Citrate Supplement    [provider]  ?traMADol (ULTRAM) 50 MG tablet Take 50 mg by mouth every 6 (six) hours as needed for moderate pain.    [provider]  ?  ? ?Family History  ?Problem Relation Age of Onset  ? Cancer Father   ?     unknown  ? ? ?Social History  ? ?Socioeconomic History  ? Marital status: Widowed  ?  Spouse name: Not on file  ? Number of children: Not on file  ? Years of education: Not on file  ? Highest education level: Not on file  ?Occupational History  ? Not on file  ?Tobacco Use  ? Smoking status: Never  ? Smokeless tobacco: Never  ?Vaping Use  ? Vaping Use: Never used  ?Substance and Sexual Activity  ? Alcohol use: Never  ?  Drug use: Never  ? Sexual activity: Not on file  ?Other Topics Concern  ? Not on file  ?Social History Narrative  ? Lives with other family   ? ?Social Determinants of Health  ? ?Financial Resource Strain: Not on file  ?Food Insecurity: Not on file  ?Transportation Needs: Not on file  ?Physical Activity: Not on file  ?Stress: Not on file  ?Social Connections: Not on file  ? ? ?Review of Systems: A 12 point ROS discussed and pertinent positives are indicated in the HPI above.  All other systems are  negative. ? ?Review of Systems  ?Constitutional:  Negative for fatigue and fever.  ?HENT:  Negative for congestion.   ?Respiratory:  Negative for cough and shortness of breath.   ?Gastrointestinal:  Negative for abdominal pain, diarrhea, nausea and vomiting.  ?Musculoskeletal:  Negative for back pain (improved since Kyphoplasty).  ? ?Vital Signs: ?There were no vitals taken for this visit. ? ?Physical Exam ?Vitals and nursing note reviewed.  ?Constitutional:   ?   Appearance: She is well-developed.  ?HENT:  ?   Head: Normocephalic and atraumatic.  ?Eyes:  ?   Conjunctiva/sclera: Conjunctivae normal.  ?Cardiovascular:  ?   Rate and Rhythm: Normal rate and regular rhythm.  ?   Heart sounds: Normal heart sounds.  ?Pulmonary:  ?   Effort: Pulmonary effort is normal.  ?   Breath sounds: Normal breath sounds.  ?Musculoskeletal:  ?   Cervical back: Normal range of motion.  ?Neurological:  ?   Mental Status: She is alert and oriented to person, place, and time.  ? ? ?Imaging: ?MR THORACIC SPINE WO CONTRAST ? ?Result Date: 11/29/2021 ?CLINICAL DATA:  Fall last year, kyphoplasty in July 2022. Mid to lower back pain with radiographs showing T11 fracture. EXAM: MRI THORACIC SPINE WITHOUT CONTRAST TECHNIQUE: Multiplanar, multisequence MR imaging of the thoracic spine was performed. No intravenous contrast was administered. COMPARISON:  Lumbar spine radiographs 03/20/2021, thoracic spine radiographs 12/20/2019. The lumbar spine radiograph from 11/28/2021 is not available, but the report is available. FINDINGS: Alignment: There is exaggerated thoracic kyphosis centered at T8. There is dextrocurvature centered at T8. Vertebrae: There is severe compression deformity of the T8 vertebral body, similar to the radiographs from 12/20/2019. Mild compression deformity of the T10 vertebral body with associated post kyphoplasty changes are noted. The kyphoplasty is new since 03/20/2021. There has been no significant progression of loss of  vertebral body height since the prior MRI. There is no bony retropulsion at either level. There is mild T1 hypointensity with associated edema along the superior endplate of the X65 vertebral body consistent with acute to subacute fracture, though there is no significant loss of vertebral body height. There is mild anterior compression deformity of the T11 vertebral body without edema to suggest acute fracture. There is no bony retropulsion. There is indentation of the superior L4 endplate, similar to the prior lumbar spine MRI. Post kyphoplasty changes are also noted at L2. The other vertebral body heights are preserved. Background marrow signal is normal. There is no suspicious marrow signal abnormality. Cord:  Normal in signal and morphology. Paraspinal and other soft tissues: Unremarkable. Disc levels: Disc heights are overall preserved in the thoracic spine. There is overall mild multilevel degenerative endplate change and facet arthropathy. There is no significant spinal canal or neural foraminal stenosis. There is no evidence of cord or nerve root impingement. Mild degenerative changes are noted in the upper lumbar spine also without evidence of significant spinal  canal or neural foraminal stenosis to the level imaged. IMPRESSION: 1. Mild marrow edema along the superior T12 endplate consistent with acute to subacute fracture but without significant loss of vertebral body height. No bony retropulsion. 2. Mild anterior wedge deformity of the T11 vertebral body without marrow edema to suggest acute fracture. No bony retropulsion at this level. 3. Unchanged marked compression deformity of the T8 vertebral body compared to the prior radiographs from 12/20/2019. 4. Mild compression deformity of the T10 vertebral body is similar in degree to the MRI from 03/20/2021, with interval kyphoplasty changes. Additional post kyphoplasty changes are noted at L2. 5. Mild degenerative changes without significant spinal canal or  neural foraminal stenosis. No cord or nerve root impingement. 6. Dextrocurvature centered and exaggerated kyphosis centered at T8. Electronically Signed   By: Valetta Mole M.D.   On: 11/29/2021 14:18  ? ?IR KYPHO THOR

## 2022-01-01 LAB — SURGICAL PATHOLOGY

## 2022-01-06 ENCOUNTER — Encounter (HOSPITAL_COMMUNITY): Payer: Self-pay | Admitting: Internal Medicine

## 2022-01-06 ENCOUNTER — Encounter: Payer: Self-pay | Admitting: Internal Medicine

## 2022-01-06 ENCOUNTER — Inpatient Hospital Stay: Payer: Medicare HMO | Attending: Internal Medicine | Admitting: Internal Medicine

## 2022-01-06 VITALS — BP 134/58 | HR 94 | Temp 98.7°F | Resp 19 | Wt 118.9 lb

## 2022-01-06 DIAGNOSIS — N183 Chronic kidney disease, stage 3 unspecified: Secondary | ICD-10-CM | POA: Insufficient documentation

## 2022-01-06 DIAGNOSIS — D649 Anemia, unspecified: Secondary | ICD-10-CM | POA: Diagnosis not present

## 2022-01-06 DIAGNOSIS — Z79899 Other long term (current) drug therapy: Secondary | ICD-10-CM | POA: Insufficient documentation

## 2022-01-06 DIAGNOSIS — E039 Hypothyroidism, unspecified: Secondary | ICD-10-CM | POA: Insufficient documentation

## 2022-01-06 DIAGNOSIS — C9310 Chronic myelomonocytic leukemia not having achieved remission: Secondary | ICD-10-CM | POA: Diagnosis present

## 2022-01-06 NOTE — Assessment & Plan Note (Addendum)
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  We will also check JAK2 mutation; MPL; CALR mutation next visit. ? ?# Mild Anemia-hemoglobin 9-10 likely secondary above..  Monitor for now.  ? ?#Patient continues to be asymptomatic.  Discussed the potential signs and symptoms of progressive leukemia-include weight loss night sweats increase risk of bleeding; worsening fatigue etc. ? ?#Discussed the treatment options for leukemia include Hydrea/decitabine.  However as patient is asymptomatic would recommend surveillance at this time. ? ?# CKD stage III [50s]- STABLE>  ? ?#Chronic intermittent di rrhea-recommend probiotics. ? ?# DISPOSITION: ?# follow up in 3 months; labs- cbc/cmp; LDH;Myeloid NGS; JAK/CALR;MPL [ordered ] - -Dr.B ? ?

## 2022-01-06 NOTE — Progress Notes (Signed)
[facility] OFFICE PROGRESS NOTE  Patient Care Team: Marisue Ivan, MD as PCP - General (Family Medicine)   # HEMATOLOGY HISTORY:  # LEUCOCYTOSIS- [FEB 2023- PCP] WBC 35 [predominant monocytosis; mild lymphocytosis; moderate neutrophilia]; [name]; platelets- 180; [name], 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    CMML (chronic myelomonocytic leukemia) (HCC)  01/06/2022 Initial Diagnosis   CMML (chronic myelomonocytic leukemia) (HCC)      HPI: Patient ambulating independently; kyphosis.  Accompanied by daughter  Amanda VANDEVOORT 88 y.o.  female pleasant patient with elevated white count is here to review the results of her bone marrow biopsy.  The bone marrow biopsy-procedure was uneventful.  Patient denies any worsening fatigue.  Denies any blood in stools or blood in the stools.  No nausea or vomiting.  Review of Systems  Constitutional:  Negative for chills, diaphoresis and fever.  HENT:  Negative for nosebleeds and sore throat.   Eyes:  Negative for double vision.  Respiratory:  Positive for cough. Negative for hemoptysis and wheezing.   Cardiovascular:  Negative for chest pain, palpitations, orthopnea and leg swelling.  Gastrointestinal:  Negative for abdominal pain, blood in stool, constipation, diarrhea, heartburn, melena, nausea and vomiting.  Genitourinary:  Negative for dysuria, frequency and urgency.  Musculoskeletal:  Negative for joint pain.  Skin: Negative.  Negative for itching and rash.  Neurological:  Negative for dizziness, tingling, focal weakness, weakness and headaches.  Endo/Heme/Allergies:  Does not bruise/bleed  easily.  Psychiatric/Behavioral:  Negative for depression. The patient is not nervous/anxious and does not have insomnia.      PAST MEDICAL HISTORY :  Past Medical History:  Diagnosis Date   Hypothyroidism    Pre-diabetes    Thyroid disease     PAST SURGICAL HISTORY :   Past Surgical History:  Procedure Laterality Date   IR KYPHO EA ADDL LEVEL THORACIC OR LUMBAR  12/17/2021   IR KYPHO THORACIC WITH BONE BIOPSY  12/17/2021   IR RADIOLOGIST EVAL & MGMT  12/09/2021   KYPHOPLASTY N/A 04/05/2021   Procedure: T10 and L2 KYPHOPLASTY;  [name], MD;  [name];  Service: Orthopedics;   [name];    FAMILY HISTORY :   Family History  Problem Relation Age of Onset   Cancer Father        unknown    SOCIAL HISTORY:   Social History   Tobacco Use   Smoking status: Never   Smokeless tobacco: Never  Vaping Use   Vaping Use: Never used  Substance Use Topics   Alcohol use: Never   Drug use: Never    ALLERGIES:  is allergic to other.  MEDICATIONS:  Current Outpatient Medications  Medication Sig Dispense Refill   Cholecalciferol (VITAMIN D-3) 125 MCG (5000 UT) TABS Take 5,[name],000 mcg by mouth in the morning.     diphenhydrAMINE (SOMINEX) 25 MG tablet Take 25 mg by mouth at bedtime as needed for sleep. Sleep Aid     ibuprofen (ADVIL) 200 MG tablet Take 200-400 mg by mouth every 4 (four) hours as needed for mild pain.     levothyroxine (SYNTHROID) 100 MCG tablet Take 100 mcg by mouth daily before breakfast.     Multiple Vitamin (MULTIVITAMIN WITH MINERALS) TABS tablet Take 1 tablet by mouth in the morning. Equate Complete Multivitamin     Multiple Vitamins-Minerals (ALGAE BASED CALCIUM) TABS Take 2 tablets by mouth in the morning and at bedtime. AlgaeCal Plus Plant-Based Calcium Supplement with Magnesium Boron Vitamin K2 + D3     Multiple Vitamins-Minerals (BONE SMART PO) Take 2 tablets by mouth daily in the afternoon. Strontium Boost Natural Strontium Citrate Supplement     cetirizine (ZYRTEC) 10 MG tablet Take 10 mg by mouth in the morning. (Patient not taking: Reported on 01/06/2022)     traMADol (ULTRAM) 50 MG tablet Take 50 mg by mouth every 6 (six) hours as needed for moderate pain. (Patient not taking: Reported on 01/06/2022)     No current facility-administered medications for this visit.    PHYSICAL EXAMINATION:  BP (!) 134/58   Pulse 94   Temp 98.7 F (37.1 C)   Resp 19   Wt 118 lb 14.4 oz (53.9 kg)   SpO2 96%   BMI 24.01 kg/m   Filed Weights   01/06/22 1327   Weight: 118 lb 14.4 oz (53.9 kg)    Physical Exam Vitals and nursing note reviewed.  HENT:     Head: Normocephalic and atraumatic.     Mouth/Throat:     Pharynx: Oropharynx is clear.  Eyes:     Extraocular Movements: Extraocular movements intact.     Pupils: Pupils are equal, round, and reactive to light.  Cardiovascular:     Rate and Rhythm: Normal rate and regular rhythm.  Pulmonary:     Comments: Decreased breath sounds bilaterally.  Abdominal:  Palpations: Abdomen is soft.  Musculoskeletal:        General: Normal range of motion.     Cervical back: Normal range of motion.  Skin:    General: Skin is warm.  Neurological:     General: No focal deficit present.     Mental Status: She is alert and oriented to person, place, and time.  Psychiatric:        Behavior: Behavior normal.        Judgment: Judgment normal.       LABORATORY DATA:  I have reviewed the data as listed    Component Value Date/Time   NA 140 12/17/2021 0709   K 4.0 12/17/2021 0709   CL 105 12/17/2021 0709   CO2 27 12/17/2021 0709   GLUCOSE 88 12/17/2021 0709   BUN 24 (H) 12/17/2021 0709   CREATININE 1.00 12/17/2021 0709   CALCIUM 9.1 12/17/2021 0709   PROT 7.2 11/20/2021 1438   ALBUMIN 4.0 11/20/2021 1438   AST 20 11/20/2021 1438   ALT 14 11/20/2021 1438   ALKPHOS 108 11/20/2021 1438   BILITOT 0.2 (L) 11/20/2021 1438   GFRNONAA 54 (L) 12/17/2021 0709   GFRAA >60 12/03/2017 1003    No results found for: SPEP, UPEP  Lab Results  Component Value Date   WBC 15.8 (H) 12/27/2021   NEUTROABS 6.7 12/27/2021   HGB 9.8 (L) 12/27/2021   HCT 29.6 (L) 12/27/2021   MCV 107.2 (H) 12/27/2021   PLT 157 12/27/2021      Chemistry      Component Value Date/Time   NA 140 12/17/2021 0709   K 4.0 12/17/2021 0709   CL 105 12/17/2021 0709   CO2 27 12/17/2021 0709   BUN 24 (H) 12/17/2021 0709   CREATININE 1.00 12/17/2021 0709      Component Value Date/Time   CALCIUM 9.1 12/17/2021 0709    ALKPHOS 108 11/20/2021 1438   AST 20 11/20/2021 1438   ALT 14 11/20/2021 1438   BILITOT 0.2 (L) 11/20/2021 1438       RADIOGRAPHIC STUDIES: I have personally reviewed the radiological images as listed and agreed with the findings in the report. No results found.   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  We will also check JAK2 mutation; MPL; CALR mutation next visit.  # Mild Anemia-hemoglobin 9-10 likely secondary above..  Monitor for now.   #Patient continues to be asymptomatic.  Discussed the potential signs and symptoms of progressive leukemia-include weight loss night sweats increase risk of bleeding; worsening fatigue etc.  #Discussed the treatment options for leukemia include Hydrea/decitabine.  However as patient is asymptomatic would recommend surveillance at this time.  # CKD stage III [50s]- STABLE>   #Chronic intermittent di rrhea-recommend probiotics.  # DISPOSITION: # follow up in 3 months; labs- cbc/cmp; LDH;Myeloid NGS; JAK/CALR;MPL [ordered ] - -Dr.B   Orders Placed This Encounter  Procedures   Myeloid Panel NGS    Standing Status:   Future    Standing Expiration Date:   01/07/2023   JAK2 genotypr    Standing Status:   Future    Standing Expiration Date:   01/06/2023   MPL mutation analysis    Standing Status:   Future    Standing Expiration Date:   01/06/2023   Calreticulin (CALR) Mutation  Analysis    Standing Status:   Future    Standing Expiration Date:   01/07/2023   CBC with Differential/Platelet    Please do manual diff- and call if abnormal cells seen    Standing Status:   Future    Standing Expiration Date:   01/07/2023   Lactate dehydrogenase    Standing Status:    Future    Standing Expiration Date:   01/07/2023   CBC with Differential/Platelet    Standing Status:   Future    Standing Expiration Date:   01/07/2023   All questions were answered. The patient knows to call the clinic with any problems, questions or concerns.      Earna Coder, MD 01/06/2022 3:45 PM

## 2022-01-14 ENCOUNTER — Encounter (HOSPITAL_COMMUNITY): Payer: Self-pay | Admitting: Internal Medicine

## 2022-01-15 LAB — SURGICAL PATHOLOGY

## 2022-01-24 NOTE — Progress Notes (Signed)
[employer]-is part of the standard diagnostic work up of bone marrow biopsies done for leukemia. GB

## 2022-02-06 LAB — COMP PANEL: LEUKEMIA/LYMPHOMA

## 2022-04-08 ENCOUNTER — Inpatient Hospital Stay: Payer: Medicare HMO | Admitting: Internal Medicine

## 2022-04-08 ENCOUNTER — Inpatient Hospital Stay: Payer: Medicare HMO | Attending: Internal Medicine

## 2022-04-08 ENCOUNTER — Encounter: Payer: Self-pay | Admitting: Internal Medicine

## 2022-04-08 DIAGNOSIS — D649 Anemia, unspecified: Secondary | ICD-10-CM | POA: Diagnosis not present

## 2022-04-08 DIAGNOSIS — N183 Chronic kidney disease, stage 3 unspecified: Secondary | ICD-10-CM | POA: Diagnosis not present

## 2022-04-08 DIAGNOSIS — C9310 Chronic myelomonocytic leukemia not having achieved remission: Secondary | ICD-10-CM | POA: Insufficient documentation

## 2022-04-08 DIAGNOSIS — R5383 Other fatigue: Secondary | ICD-10-CM | POA: Insufficient documentation

## 2022-04-08 LAB — CBC WITH DIFFERENTIAL/PLATELET
Abs Immature Granulocytes: 0.25 10*3/uL — ABNORMAL HIGH (ref 0.00–0.07)
Basophils Absolute: 0.1 10*3/uL (ref 0.0–0.1)
Basophils Relative: 0 %
Eosinophils Absolute: 0.1 10*3/uL (ref 0.0–0.5)
Eosinophils Relative: 1 %
HCT: 29.6 % — ABNORMAL LOW (ref 36.0–46.0)
Hemoglobin: 9.8 g/dL — ABNORMAL LOW (ref 12.0–15.0)
Immature Granulocytes: 1 %
Lymphocytes Relative: 15 %
Lymphs Abs: 2.8 10*3/uL (ref 0.7–4.0)
MCH: 35.8 pg — ABNORMAL HIGH (ref 26.0–34.0)
MCHC: 33.1 g/dL (ref 30.0–36.0)
MCV: 108 fL — ABNORMAL HIGH (ref 80.0–100.0)
Monocytes Absolute: 7.4 10*3/uL — ABNORMAL HIGH (ref 0.1–1.0)
Monocytes Relative: 42 %
Neutro Abs: 7.5 10*3/uL (ref 1.7–7.7)
Neutrophils Relative %: 41 %
Platelets: 160 10*3/uL (ref 150–400)
RBC: 2.74 MIL/uL — ABNORMAL LOW (ref 3.87–5.11)
RDW: 13.2 % (ref 11.5–15.5)
Smear Review: NORMAL
WBC: 18.2 10*3/uL — ABNORMAL HIGH (ref 4.0–10.5)
nRBC: 0 % (ref 0.0–0.2)

## 2022-04-08 LAB — LACTATE DEHYDROGENASE: LDH: 171 U/L (ref 98–192)

## 2022-04-08 NOTE — Progress Notes (Signed)
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, white count elevated.     RBC (Red Blood Cell Count) 4.04 - 5.48 10^6/uL 3.02 Low     Hemoglobin 12.0 - 15.0 gm/dL 10.5 Low     Hematocrit 35.0 - 47.0 % 31.9 Low     MCV (Mean Corpuscular Volume) 80.0 - 100.0 fl 105.6 High     MCH (Mean Corpuscular Hemoglobin) 27.0 - 31.2 pg 34.8 High     MCHC (Mean Corpuscular Hemoglobin Concentration) 32.0 - 36.0 gm/dL 32.9    Platelet Count 150 - 450 10^3/uL 180    RDW-CV (Red Cell Distribution Width) 11.6 - 14.8 % 14.5    MPV (Mean Platelet Volume) 9.4 - 12.4 fl 9.1 Low     Neutrophils 1.50 - 7.80 10^3/uL 16.28 High     Lymphocytes 1.00 - 3.60 10^3/uL 4.61 High     Monocytes 0.00 - 1.50 10^3/uL 16.92 High     Eosinophils 0.00 - 0.55 10^3/uL 0.48    Basophils 0.00 - 0.09 10^3/uL 0.07    Neutrophil % 32.0 - 70.0 % 41.2    Lymphocyte % 10.0 - 50.0 % 11.7    Monocyte % 4.0 - 13.0 % 42.8 High   See manual diff and path review.   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    CMML (chronic myelomonocytic leukemia) ([place])  01/06/2022 Initial Diagnosis   CMML (chronic myelomonocytic leukemia) ([facility])      HPI: Patient ambulating independently.   Accompanied by daughter  Amanda Rose 88 y.o.  female pleasant patient chronic myelomonocytic leukemia platelets evidence is here for follow-up.  In the interim has had no hospitalizations.  Ongoing fatigue.  However, Patient denies any worsening fatigue.  Denies any blood in stools or blood in the stools.  No nausea or vomiting.  Review of Systems  Constitutional:  Positive for malaise/fatigue. Negative for chills, [name], palpitations, orthopnea and leg swelling.  Gastrointestinal:  Negative for abdominal pain, blood in stool, constipation, diarrhea, heartburn, melena, nausea and vomiting.  Genitourinary:  Negative for dysuria, frequency and urgency.  Musculoskeletal:  Negative for joint pain.  Skin: Negative.  Negative for itching and rash.  Neurological:  Negative for dizziness, tingling, focal weakness, weakness and headaches.  Endo/Heme/Allergies:  Does not bruise/bleed easily.  Psychiatric/Behavioral:  Negative for depression. The patient is not nervous/anxious and does not have insomnia.       PAST MEDICAL HISTORY :  Past Medical History:  Diagnosis Date   Hypothyroidism    Pre-diabetes    Thyroid disease     PAST SURGICAL HISTORY :   Past Surgical History:  Procedure Laterality Date   IR KYPHO EA ADDL LEVEL THORACIC OR LUMBAR  12/17/2021   IR KYPHO THORACIC WITH BONE BIOPSY  12/17/2021   IR RADIOLOGIST EVAL & MGMT  12/09/2021   KYPHOPLASTY N/A 04/05/2021   Procedure: T10 and L2 KYPHOPLASTY;  Surgeon: Hessie Knows, MD;  [name];  Service:  Orthopedics;  [name];    FAMILY HISTORY :   Family History  Problem Relation Age of Onset   Cancer Father        unknown    SOCIAL HISTORY:   Social History   Tobacco Use   Smoking status: Never   Smokeless tobacco: Never  Vaping Use   Vaping Use: Never used  Substance Use Topics   Alcohol use: Never   Drug use: Never    ALLERGIES:  is allergic to other.  MEDICATIONS:  Current Outpatient Medications  Medication Sig Dispense Refill   Cholecalciferol (VITAMIN D-3) 125 MCG (5000 UT) TABS Take 5,[name],000 mcg by mouth in the morning.     diphenhydrAMINE (SOMINEX) 25 MG tablet Take 25 mg by mouth at bedtime as needed for sleep. Sleep Aid     ibuprofen (ADVIL) 200 MG tablet Take 200-400 mg by mouth every 4 (four) hours as needed for mild pain.     levothyroxine (SYNTHROID) 100 MCG tablet Take 100 mcg by mouth daily before breakfast.     Multiple Vitamin (MULTIVITAMIN WITH MINERALS) TABS tablet Take 1 tablet by mouth in the morning. Equate Complete Multivitamin     Multiple Vitamins-Minerals (ALGAE BASED CALCIUM) TABS Take 2 tablets by mouth in the morning and at bedtime. AlgaeCal Plus Plant-Based Calcium Supplement with Magnesium Boron Vitamin K2 + D3     Multiple Vitamins-Minerals (BONE SMART PO) Take 2 tablets by mouth daily in the afternoon. Strontium Boost Natural Strontium Citrate Supplement     traMADol (ULTRAM) 50 MG tablet Take 50 mg by mouth every 6 (six) hours as needed for moderate pain.     cetirizine (ZYRTEC) 10 MG tablet Take 10 mg by mouth in the morning. (Patient not taking: Reported on 01/06/2022)     No current facility-administered medications for this visit.    PHYSICAL EXAMINATION:  BP (!) 148/67 (BP Location: Left Arm, Patient Position: Sitting, Cuff Size: Normal)   Pulse 76   Temp (!) 96.9 F (36.1 C) (Tympanic)   Ht 4' 11"  (1.499 m)   Wt 115 lb 9.6 oz (52.4 kg)    SpO2 98%   BMI 23.35 kg/m   Filed Weights   04/08/22 1320  Weight: 115 lb 9.6 oz (52.4 kg)    Physical Exam Vitals and nursing note reviewed.  HENT:     Head: Normocephalic and atraumatic.     Mouth/Throat:     Pharynx: Oropharynx is clear.  Eyes:     Extraocular Movements: Extraocular movements intact.     Pupils: Pupils are equal, round, and reactive to light.  Cardiovascular:     Rate and Rhythm: Normal rate and regular rhythm.  Pulmonary:     Comments:  Decreased breath sounds bilaterally.  Abdominal:     Palpations: Abdomen is soft.  Musculoskeletal:        General: Normal range of motion.     Cervical back: Normal range of motion.  Skin:    General: Skin is warm.  Neurological:     General: No focal deficit present.     Mental Status: She is alert and oriented to person, place, and time.  Psychiatric:        Behavior: Behavior normal.        Judgment: Judgment normal.        LABORATORY DATA:  I have reviewed the data as listed    Component Value Date/Time   NA 140 12/17/2021 0709   K 4.0 12/17/2021 0709   CL 105 12/17/2021 0709   CO2 27 12/17/2021 0709   GLUCOSE 88 12/17/2021 0709   BUN 24 (H) 12/17/2021 0709   CREATININE 1.00 12/17/2021 0709   CALCIUM 9.1 12/17/2021 0709   PROT 7.2 11/20/2021 1438   ALBUMIN 4.0 11/20/2021 1438   AST 20 11/20/2021 1438   ALT 14 11/20/2021 1438   ALKPHOS 108 11/20/2021 1438   BILITOT 0.2 (L) 11/20/2021 1438   GFRNONAA 54 (L) 12/17/2021 0709   GFRAA >60 12/03/2017 1003    No results found for: "SPEP", "UPEP"  Lab Results  Component Value Date   WBC 18.2 (H) 04/08/2022   NEUTROABS 7.5 04/08/2022   HGB 9.8 (L) 04/08/2022   HCT 29.6 (L) 04/08/2022   MCV 108.0 (H) 04/08/2022   PLT 160 04/08/2022      Chemistry      Component Value Date/Time   NA 140 12/17/2021 0709   K 4.0 12/17/2021 0709   CL 105 12/17/2021 0709   CO2 27 12/17/2021 0709   BUN 24 (H) 12/17/2021 0709   CREATININE 1.00 12/17/2021 0709       Component Value Date/Time   CALCIUM 9.1 12/17/2021 0709   ALKPHOS 108 11/20/2021 1438   AST 20 11/20/2021 1438   ALT 14 11/20/2021 1438   BILITOT 0.2 (L) 11/20/2021 1438       RADIOGRAPHIC STUDIES: I have personally reviewed the radiological images as listed and agreed with the findings in the report. No results found.   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  PENDING-  check JAK2 mutation; MPL; CALR mutation.   # Mild Anemia-hemoglobin 9-10 likely secondary above..  Monitor for now.   #Patient continues to be asymptomatic.  Discussed the potential signs and symptoms of progressive leukemia-include weight loss night sweats increase risk of bleeding; worsening fatigue etc.  #Discussed the treatment options for leukemia include Hydrea/decitabine.  However as patient is asymptomatic would recommend surveillance at this time.  # CKD stage III [50s]- STABLE>   # DISPOSITION: # follow up in 3 months;1 WEEK PRIOR labs- cbc/cmp; LDH; iron studies; ferritin - -Dr.B   Orders Placed This Encounter  Procedures   CBC with Differential/Platelet    Standing Status:   Future    Standing Expiration Date:   04/09/2023   Comprehensive metabolic panel    Standing Status:   Future    Standing Expiration Date:   04/09/2023   Lactate dehydrogenase    Standing Status:   Future    Standing Expiration Date:   04/09/2023  Iron and TIBC    Standing Status:   Future    Standing Expiration Date:   04/09/2023   Ferritin    Standing Status:   Future    Standing Expiration Date:   04/09/2023   All questions were answered. The patient knows to call the clinic with any problems, questions or concerns.      Cammie Sickle, MD 04/08/2022 2:43 PM

## 2022-04-08 NOTE — Assessment & Plan Note (Addendum)
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  PENDING-  check JAK2 mutation; MPL; CALR mutation.   # Mild Anemia-hemoglobin 9-10 likely secondary above..  Monitor for now.   #Patient continues to be asymptomatic.  Discussed the potential signs and symptoms of progressive leukemia-include weight loss night sweats increase risk of bleeding; worsening fatigue etc.  #Discussed the treatment options for leukemia include Hydrea/decitabine.  However as patient is asymptomatic would recommend surveillance at this time.  # CKD stage III [50s]- STABLE>   # DISPOSITION: # follow up in 3 months;1 WEEK PRIOR labs- cbc/cmp; LDH; iron studies; ferritin - -Dr.B

## 2022-04-08 NOTE — Progress Notes (Signed)
She would like to know if she can have her labs done a week before seeing you?

## 2022-04-11 ENCOUNTER — Encounter: Payer: Self-pay | Admitting: Internal Medicine

## 2022-04-13 LAB — JAK2 GENOTYPR

## 2022-04-14 ENCOUNTER — Encounter: Payer: Self-pay | Admitting: Internal Medicine

## 2022-04-14 LAB — MISC LABCORP TEST (SEND OUT): Labcorp test code: 489615

## 2022-04-15 LAB — MPL MUTATION ANALYSIS

## 2022-04-16 ENCOUNTER — Other Ambulatory Visit: Payer: Self-pay | Admitting: Internal Medicine

## 2022-04-16 DIAGNOSIS — R161 Splenomegaly, not elsewhere classified: Secondary | ICD-10-CM

## 2022-04-16 LAB — CALRETICULIN (CALR) MUTATION ANALYSIS

## 2022-04-16 NOTE — Progress Notes (Signed)
Ordered ultrasound abdomen-for splenomegaly.   GB

## 2022-04-16 NOTE — Progress Notes (Signed)
Hope your daughter, Amanda Rose, guarding multiple persons to guarding her mothers diagnosis. High risk CMML- who is not a candidate for aggressive therapies. Monitor for now.  Consider Hydrea/HMA-chemotherapy if symptomatic.  Ordered ultrasound to rule out splenomegaly.

## 2022-04-24 ENCOUNTER — Ambulatory Visit
Admission: RE | Admit: 2022-04-24 | Discharge: 2022-04-24 | Disposition: A | Discharge status: Home / Self Care | Payer: Medicare HMO | Source: Ambulatory Visit | Attending: Internal Medicine | Admitting: Internal Medicine

## 2022-04-24 DIAGNOSIS — R161 Splenomegaly, not elsewhere classified: Secondary | ICD-10-CM | POA: Diagnosis not present

## 2022-05-01 ENCOUNTER — Telehealth: Payer: Self-pay | Admitting: *Deleted

## 2022-05-01 NOTE — Telephone Encounter (Signed)
Patient called and requests Dr B call her to go over lab results form 8/1  to explain them to her. Her next appointment is not until 07/09/22.  Miscellaneous [facility] test (send-out) Order: [id number] Status: Final result    Visible to patient: Yes (seen)    Next appt: 07/09/2022 at 02:15 PM in Oncology ([facility])    0 Result Notes    Component 3 wk ago  [facility] test code [phone]   [facility] test name JAK CALR MPL NGS PANEL   Comment: Performed at [facility], [address] [place], [place] [address]  [place] result COMMENT   Comment: (NOTE)  Test Ordered: [id number] NGS JAK2 V617F/CALR/MPL  JAK2 V617F Result              Comment                   YU    NEGATIVE  The JAK2 V617F mutation is not detected in the provided specimen  of this individual. Results should be interpreted in conjunction  with clinical and other laboratory findings for the most accurate  interpretation.  This test was developed and its performance characteristics  determined by [facility]. It has not been cleared or approved  by the Food and Drug Administration.  CALR Result                    Comment                   YU    NEGATIVE  No insertions or deletions were detected within the analyzed region  of the calreticulin (CALR) gene.  A negative result does not entirely exclude the possibility of a  clonal population carrying CALR gene mutations that are not covered  by this assay. Results should be interpreted in conjunction with  clinical and laboratory findings for the most accurate  interpretation.  MPL Result                     Comment                   YU    NEGATIVE  No MPL mutation was identified in the provided specimen of this  individual. Results should be interpreted in conjunction with  clinical and other laboratory findings for the most accurate  interpretation.  JAK2 V617F/CALR/MPL Background Comment                   TG    Molecular testing of blood or bone marrow is useful in the   evaluation of suspected myeloproliferative neoplasms (MPN).  Mutations in the JAK2, MPL, and CALR genes are present in  virtually all MPNs and their presence help distinguish benign  reactive processes from clonal neoplasms. These mutations are  generally considered mutually exclusive, although concurrent  clones have been reported in rare patients.  The JAK2 (Janus kinase 2) gene encodes for a non-receptor protein  tyrosine kinase that activates cytokine and growth factor  signaling. The V617F (c.1849 G>T) mutation results in  constitutive activation of JAK2 and downstream STAT5 and ERK  signaling. The V617F mutation is observed in approximately 95% of  polycythemia vera (PV), 60% of essential  thrombocythemia (ET) and primary myelofibrosis (PMF). It is also  infrequently present (3-5%) in myelodysplastic syndrome, chronic  myelomonocytic leukemia, and other atypical chronic myeloid  disorders. JAK2 mutation allele burden correlates with clinical  phenotype, with low levels  of mutant allele characterized by  thrombocytosis, intermediate levels with erythrocytosis, and high  mutant allele burden correlating with enhanced myelopoiesis of  the BM, leukocytosis, increasing spleen size, and circulating  CD34-positive cells.  The CALR (Calreticulin) gene encodes for a multifunctional  calcium-binding protein involved in many cellular activities such  as growth, proliferation, adhesion, and programmed cell death.  Among patients with JAK2 negative MPNs, CALR  mutations are found  in approximately 70% of patients with JAK2-negative essential  thrombocythemia (ET) and 60-88% of patients with JAK2-negative  primary myelofibrosis(PMF). Only a minority of patients  (approximately 8%) with myelodysplasia have mutations in the CALR  gene. CALR mutations are rarely detected in patients with de novo  acute myeloid leukemia, chronic myelogenous leukemia, lymphoid  leukemia, or solid tumors. CALR  mutations are not detected in  polycythemia and generally appear to be mutually exclusive with  JAK2 mutations and MPL mutations. The majority of mutational  changes involve a variety of insertion or deletion mutations in  exon 9 of the calreticulin gene: approximately 53% of all CALR  mutations are a 52 bp deletion (type-1) while the second most  prevalent mutation (approximately 32%) contains a 5 bp insertion  (type-2). Other mutations (non-type 1 or type 2) are seen in a  small minority of cases. CALR mutations in PMF tend to be  associated with a favorable prognosis compared to JAK2 V617F  mutations, whereas primary myelofibrosis negative for CALR, JAK2  V617F and MPL mutations (so-called triple negative) is associated  with a poor prognosis and shorter survival.  The MPL (myeloproliferative leukemia virus oncogene) gene encodes  the thrombopoietin receptor which regulates hematopoiesis and  megakaryopoiesis. Activating MPL mutations are associated with a  subset of myeloproliferative neoplasms and acute megakaryoblastic  leukemia. MPL W515 mutations are present in approximately 5-8% of  patients with primary myelofibrosis (PMF) and 1-4% of patients  with essential thrombocythemia (ET). The S505 mutation is  detected in patients with hereditary thrombocythemia.  Limitations  This assay has a sensitivity of approximately 1% VAF for  JAK2V617F mutations, 2.5% VAF for CALR and MPL mutations.  Method                         Comment                   YU    Amplicon-based next generation sequencing.  References                     Comment                   YU    Fanny Dance, Alnouri Y, Abalkhail Carmela Hurt. Detection of  mutations in JAK2 exons 12-15 by [employer] sequencing. Int J Lab  Hematol. 2016 Feb;38(1):34-41. doi: 10.1111/ijlh.09381. Epub 2015  Sep 11. PMID: 82993716.  Octavia Heir, Oneida Arenas, Hasserjian R, Linward Foster, Borowitz MJ, Zada Finders  MM, [place] CD, [place], Vardiman JW. The  2016 revision to  the World Health Organization classification of myeloid neoplasms  and acute leukemia. Blood. 2016 May 19;127(20):2391-405. doi:  10.1182/blood-2016-03-643544. Epub 2016 Apr 11. PMID: 96789381.  Blima Ledger [facility], Zhang ZJ, [place] S,  Albitar M. Mutation profile of JAK2 transcripts in patients with  chronic myeloproliferative neoplasias. J Mol Diagn. 2009  Jan;11(1):49-53.doi: 10.2353/jmoldx.2009.080114. Epub 2008 Dec 12.  PMID: 01751025; PMCID: ENI7782423.  NCCN Clinical Practice Guidelines in Oncology (NCCN Guidelines)  Myeloproliferative Neoplasms Version 3.2022 - April 18, 2021.  Swerdlow SH, English as a second language teacher. WHO classification of Tumours of  Haematopoietic and Lymphoid Tissues. 4th edn. 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  Tefferi A. Primary myelofibrosis: 2021 update on diagnosis,  risk-stratification and management. Am J Hematol. 2021  Jan;96(1):145-162. doi: 10.1002/ajh.26050. Epub 2020 Dec 2. PMID:  03704888.  Grier Mitts, Kralovics R. Genetic basis and molecular  pathophysiology of classical myeloproliferative neoplasms. Blood.  2017 Feb 9;129(6):667-679. doi: 10.1182/blood-2016-10-695940.  Epub 2016 Dec 27. PMID: 91694503.  Director Review                Comment                   YU    Marc Morgans, PhD, [facility]  Director, [place] [facility]  [facility] for [place] and [place], [place] [address]  [phone]  Performed At: [facility]  [place], [place] [id number]  Rush Farmer MD KP:[id number]  Performed At: [facility]  [place], [place] [id number]  Katina Degree MDPhD EO:[id number]  Performed At: [facility]  [address] [place], [place] [id number]  Katina Degree MDPhD [id number]   Resulting Agency [facility] CLIN LAB         Specimen Collected: 04/08/22 13:02 Last Resulted: 04/14/22 16:36      Calreticulin (CALR) Mutation  Analysis Order: [id number] Status: Final result    Visible to patient: Yes (seen)    Next appt: 07/09/2022 at 02:15 PM in Oncology ([facility])    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    0 Result Notes    Component 3 wk ago  CALR Mutation Detection Result Comment   Comment: (NOTE)  NEGATIVE  No insertions or deletions were detected within the analyzed region  of the calreticulin (CALR) gene.  A negative result does not entirely exclude the possibility of a  clonal population carrying CALR gene mutations that are not covered  by this assay. Results should be interpreted in conjunction with  clinical and laboratory findings for the most accurate  interpretation.   Background: Comment   Comment: (NOTE)  The calcium-binding endoplasmic reticulin chaperone protein,  calreticulin (CALR), is somatically mutated in approximately 70% of  patients with JAK2-negative essential thrombocythemia (ET) and 60-  88% of patients with JAK2-negative primary myelofibrosis(PMF). Only a  minority of patients (approximately 8%) with myelodysplasia have  mutations in  CALR gene. CALR mutations are rarely detected in  patients with de novo acute myeloid leukemia, chronic myelogenous  leukemia, lymphoid leukemia, or solid tumors. CALR mutations are  not detected in polycythemia and generally appear to be mutually  exclusive with JAK2 mutations and MPL mutations.  The majority of mutational changes involve a variety of insertion or  deletion mutations in exon 9 of the calreticulin gene:  approximately 53% of all CALR mutations are a 52 bp deletion (type-1)  while the second most prevalent mutation (approximately 32%) contains  a 5 bp insertion (type-2). Other mutations (non-type 1 or type 2) are  seen in a small minority of cases. CALR mutations in PMF tend to be  associated with a favorable prognosis compared to JAK2 V617F  mutations, whereas primary myelofibrosis negative for CALR, JAK2  V617F  and MPL mutations (so-called triple negative) is associated  with a poor prognosis and shorter survival.  The detection of a CALR gene mutation aids in the specific diagnosis  of  a myeloproliferative neoplasm, and help distinguish this clonal  disease from a benign reactive process.   Methodology: Comment   Comment: (NOTE)  Genomic DNA was isolated from the provided specimen. Polymerase chain  reaction (PCR) of exon 9 of the CALR gene was performed with  specific fluorescent-labeled primers, and the PCR product was  analyzed by capillary gel electrophoresis to determine the size of  the PCR products. This PCR assay is capable of detecting a mutant  cell population with a sensitivity of 5 mutant cells per 100 normal  cells. A negative result does not exclude the presence of a  myeloproliferative disorder or other neoplastic process.  This test was developed and its performance characteristics  determined by [facility]. It has not been cleared or approved by the  Food and Drug Administration. The FDA has determined that such  clearance or approval is not necessary.   References: Comment   Comment: (NOTE)  1. Klampfel, T. et al. (2013) Somatic mutations of calreticulin in    myeloproliferative neoplasms. [place]l. J. Med. 505:3976-7341.  2. Haynes Kerns et al. (2013) Somatic CALR mutations in    myeloproliferative neoplasms with nonmutated JAK2. [place]. J.    Med. [phone].   Director Review Comment   Comment: (NOTE)  Loni Muse, PhD, [facility]     Director, [facility] for [place] and [place], [place] [address]     [phone]  Performed At: [place], [place] [id number]  Katina Degree MDPhD JH:[id number]  Performed At: [facility]  [place], [place] [id number]  Katina Degree MDPhD WY:[id number]   Resulting Agency [facility] CLIN LAB         Specimen Collected: 04/08/22 13:02 Last  Resulted: 04/16/22 16:36      Lactate dehydrogenase Order: [id number] Status: Final result    Visible to patient: Yes (seen)    Next appt: 07/09/2022 at 02:15 PM in Oncology ([facility])    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    0 Result Notes          Component Ref Range & Units 3 wk ago (04/08/22) 4 mo ago (12/27/21) 4 mo ago (12/17/21) 5 mo ago (11/20/21) 1 yr ago (04/04/21) 4 yr ago (12/03/17)  WBC 4.0 - 10.5 K/uL 18.2 High   15.8 High   15.1 High   17.1 High   11.2 High   4.8 R   RBC 3.87 - 5.11 MIL/uL 2.74 Low   2.76 Low   2.84 Low   2.92 Low   2.75 Low   3.59 Low  R   Hemoglobin 12.0 - 15.0 g/dL 9.8 Low   9.8 Low   10.2 Low  10.4 Low   10.0 Low   12.2 R   HCT 36.0 - 46.0 % 29.6 Low   29.6 Low   30.5 Low   31.0 Low   29.4 Low   36.3 R   MCV 80.0 - 100.0 fL 108.0 High   107.2 High   107.4 High   106.2 High   106.9 High   101.2 High    MCH 26.0 - 34.0 pg 35.8 High   35.5 High   35.9 High   35.6 High   36.4 High   34.0   MCHC 30.0 - 36.0 g/dL 33.1  33.1  33.4  33.5  34.0  33.7 R   RDW 11.5 - 15.5 % 13.2  14.6  14.7  14.6  14.3  14.1 R   Platelets 150 - 400 K/uL 160  157  176  237  164  215 R, CM   nRBC 0.0 - 0.2 % 0.0  0.0  0.0  0.0  0.0 CM    Neutrophils Relative % % 41  43  36  42     Neutro Abs 1.7 - 7.7 K/uL 7.5  6.7  5.4  7.4     Lymphocytes Relative % '15  16  20  13     '$ Lymphs Abs 0.7 - 4.0 K/uL 2.8  2.6  2.9  2.2     Monocytes Relative  % 42  39  41  41     Monocytes Absolute 0.1 - 1.0 K/uL 7.4 High   6.2 High   6.3 High   7.0 High      Eosinophils Relative % '1  1  2  2     '$ Eosinophils Absolute 0.0 - 0.5 K/uL 0.1  0.2  0.2  0.3     Basophils Relative % 0  0  0  1     Basophils Absolute 0.0 - 0.1 K/uL 0.1  0.0  0.0  0.1     WBC Morphology  DIFF CONFIRMED BY MANUAL.    DIFF CONFIRMED BY MANUAL.     RBC Morphology  UNREMARKABLE    UNREMARKABLE     Smear Review  Normal platelet morphology    Normal platelet morphology CM     Comment: PLATELETS APPEAR ADEQUATE  Immature Granulocytes % '1  1  1  1     '$ Abs Immature Granulocytes 0.00 - 0.07 K/uL 0.25 High   0.14 High  CM  0.12 High  CM  0.20 High  CM     Comment: Performed at [facility], [address] [place], [place] [address]  Resulting Agency  [facility] CLIN LAB 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    0 Result Notes    Component 3 wk ago  JAK2 GenotypR Comment   Comment: (NOTE)  Result: NEGATIVE for the JAK2 V617F mutation.  Interpretation:  The G to T nucleotide change encoding the V617F  mutation was not detected.  This result does not rule out the  presence of the JAK2 mutation at a level below the sensitivity of  detection of  this assay, or the presence of other mutations within  JAK2 not detected by this assay.  This result does not rule out a  diagnosis of polycythemia vera, essential thrombocythemia or  idiopathic myelofibrosis as the V617F mutation is not detected in all  patients with these disorders.   Director Review, JAK2 Comment VC   Comment: (NOTE)  Technical Component performed at [facility] performed by:  Marc Morgans, PhD, [facility]  Director, Molecular Oncology  [address]  [place], [place] [address]  [phone]  This test was developed and its performance characteristics  determined by [facility]. It has not been cleared or approved  by the Food and Drug Administration.  Performed At: [facility]  [address] [place], [place] [id number]  Katina Degree MDPhD [id number]:[id number]  Performed At: [facility]  [address] [place], [place] [id number]  Katina Degree MDPhD [id number]   BACKGROUND: Comment VC   Comment: (NOTE)  JAK2 is a cytoplasmic tyrosine kinase with a key role in signal  transduction from multiple hematopoietic growth factor receptors. A  point mutation within exon 14 of the JAK2 gene ([id number]) encoding a  valine to phenylalanine substitution at position 617 of the JAK2  protein (V617F) has been identified in most patients with  polycythemia vera, and in about half of those with either essential  thrombocythemia or idiopathic myelofibrosis. The V617F has also been  detected, although infrequently, in other myeloid disorders such as  chronic myelomonocytic leukemia and chronic neutrophilic luekemia.  V617F is an acquired mutation that alters a highly conserved valine  present in the negative regulatory JH2 domain of the JAK2 protein and  is predicted to dysregulate kinase activity.  Methodology:  Total genomic DNA was extracted and subjected to TaqMan real-time  PCR amplification/detection. Two amplification products per sample  were monitored by real-time PCR using primers/probes specific to JAK2  wild type (WT) and JAK2 mutant V617F. The ABI7900 Absolute  Quantitation software will compare the patient specimen valuse to the  standard curves and generate percent values for wild type and mutant  type. In vitro studies have indicated that this assay has an  analytical sensitivity of 1%.  References:  Baxter EJ, Scott Phineas Real, et al. Acquired mutation of the  tyrosine  kinase JAK2 in human myeloproliferative disorders. Lancet.  2005 Mar 19-25; 365(9464):1054-1061.  Alfonso Ramus Couedic JP. A unique clonal JAK2 mutation  leading to constitutive signaling causes polycythaemia vera. Nature.  2005 Apr 28; 434(7037):1144-1148.  Kralovics R, Passamonti F, Buser AS, et al. A gain-of-function  mutation of JAK2 in myeloproliferative disorders. N Engl J Med.  2005 Apr 28; 352(17):1779-1790.   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    0 Result Notes    Component 3 wk ago  MPL Mutation Analysis Result: Comment VC   Comment: (NOTE)  No MPL mutation was identified in the provided specimen of this  individual. Results should be interpreted in conjunction with  clinical and other laboratory findings for the most accurate  interpretation.   BACKGROUND: Comment   Comment: (NOTE)  MPL (myeloproliferative leukemia  virus oncogene homology) belongs to  the hematopoietin superfamily and enables its ligand thrombopoietin  to facilitate both global hematopoiesis and megakaryocyte growth  and differentiation. MPL W515 mutations are present in patients with  primary myelofibrosis (PMF) and essential thrombocythemia (ET) at a  frequency of approximately 5% and 1% respectively. The S505  mutation is detected in patients with hereditary thrombocythemia.   METHODOLOGY: Comment   Comment: (NOTE)  Genomic DNA was purified from the provided specimen. MPL gene region  covering the S505N and W515L/K mutations were subjected to PCR  amplification and bi-directional sequencing in duplicate to  identify sequence variations. This assay has a sensitivity to detect  approximately 20-25%  population of cells containing the MPL  mutations in a background of non-mutant cells. This assay will not  detect the mutation below the sensitivity of this assay. Molecular-  based testing is highly accurate, but as in any laboratory test, rare  diagnostic errors may occur.   REFERENCES: Comment   Comment: (NOTE)  1. Pardanani AD, et al. (2006). MPL515 mutations in    myeloproliferative and other myeloid disorders: a study    of 1182 patients. Blood 245:8099-8338.  2. Andre Lefort and Levine RL. (2008). JAK2 and MPL    mutations in myeloproliferative neoplasms: discovery and    science. Leukemia 22:1813-1817.  3. Juline Patch, et al. (2009). Evidence for a founder effect    of the MPL-S505N mutation in eight [place] pedigrees with    hereditary thrombocythemia. Haematologica 94(10):1368-    2505.   DIRECTOR REVIEW: Comment   Comment: (NOTE)  Loni Muse, PhD, [facility]     Director, [place] for [place] and [place], [place] [address]     [phone]  This test was developed and its performance characteristics  determined by [facility]. It has not been cleared or approved  by the Food and Drug Administration.  Performed At: [facility]  [address] [place], [place] [id number]  Katina Degree MDPhD [id number]  Performed At: [facility]  [place], [place] [id number]  Katina Degree MDPhD [id number]   Resulting Agency [facility] CLIN LAB         Specimen Collected: 04/08/22 13:02 Last Resulted: 04/15/22 15:36

## 2022-05-02 NOTE — Telephone Encounter (Signed)
Left message on voicemail that Dr. B would call this afternoon.

## 2022-05-05 ENCOUNTER — Encounter: Payer: Self-pay | Admitting: Internal Medicine

## 2022-05-05 NOTE — Progress Notes (Signed)
Spoke to patient regarding the results of the ultrasound.  No evidence of any hepatosplenomegaly.  Discussed regarding the findings noted on the left kidney discussed regarding CT scan patient wants to forego for now.  I think is reasonable at her age.  Will discuss further at next visit.  GB

## 2022-07-09 ENCOUNTER — Inpatient Hospital Stay: Payer: Medicare HMO

## 2022-07-14 ENCOUNTER — Inpatient Hospital Stay: Payer: Medicare HMO

## 2022-07-16 ENCOUNTER — Inpatient Hospital Stay: Payer: Medicare HMO | Admitting: Internal Medicine

## 2022-08-06 ENCOUNTER — Telehealth: Payer: Self-pay | Admitting: Internal Medicine

## 2022-08-06 NOTE — Telephone Encounter (Signed)
Pt daughter Almyra Free called and stated that pt took last prednisone today. She wanted to know if he lab appt on Monday would be ok and if the meds would alter her results

## 2022-08-11 ENCOUNTER — Other Ambulatory Visit: Payer: Medicare HMO

## 2022-08-11 ENCOUNTER — Ambulatory Visit: Payer: Medicare HMO | Admitting: Internal Medicine

## 2022-08-19 ENCOUNTER — Ambulatory Visit: Payer: Medicare HMO | Admitting: Internal Medicine

## 2022-08-25 ENCOUNTER — Inpatient Hospital Stay: Payer: Medicare HMO | Attending: Internal Medicine

## 2022-08-25 DIAGNOSIS — N183 Chronic kidney disease, stage 3 unspecified: Secondary | ICD-10-CM | POA: Diagnosis not present

## 2022-08-25 DIAGNOSIS — C9310 Chronic myelomonocytic leukemia not having achieved remission: Secondary | ICD-10-CM | POA: Insufficient documentation

## 2022-08-25 DIAGNOSIS — Z79899 Other long term (current) drug therapy: Secondary | ICD-10-CM | POA: Insufficient documentation

## 2022-08-25 DIAGNOSIS — R5383 Other fatigue: Secondary | ICD-10-CM | POA: Insufficient documentation

## 2022-08-25 DIAGNOSIS — D649 Anemia, unspecified: Secondary | ICD-10-CM | POA: Insufficient documentation

## 2022-08-25 LAB — CBC WITH DIFFERENTIAL/PLATELET
Abs Immature Granulocytes: 0.15 10*3/uL — ABNORMAL HIGH (ref 0.00–0.07)
Basophils Absolute: 0 10*3/uL (ref 0.0–0.1)
Basophils Relative: 0 %
Eosinophils Absolute: 0.2 10*3/uL (ref 0.0–0.5)
Eosinophils Relative: 1 %
HCT: 27.8 % — ABNORMAL LOW (ref 36.0–46.0)
Hemoglobin: 9 g/dL — ABNORMAL LOW (ref 12.0–15.0)
Immature Granulocytes: 1 %
Lymphocytes Relative: 15 %
Lymphs Abs: 2.6 10*3/uL (ref 0.7–4.0)
MCH: 33.5 pg (ref 26.0–34.0)
MCHC: 32.4 g/dL (ref 30.0–36.0)
MCV: 103.3 fL — ABNORMAL HIGH (ref 80.0–100.0)
Monocytes Absolute: 8.1 10*3/uL — ABNORMAL HIGH (ref 0.1–1.0)
Monocytes Relative: 44 %
Neutro Abs: 7 10*3/uL (ref 1.7–7.7)
Neutrophils Relative %: 39 %
Platelets: 152 10*3/uL (ref 150–400)
RBC: 2.69 MIL/uL — ABNORMAL LOW (ref 3.87–5.11)
RDW: 13.9 % (ref 11.5–15.5)
WBC: 18.1 10*3/uL — ABNORMAL HIGH (ref 4.0–10.5)
nRBC: 0 % (ref 0.0–0.2)

## 2022-08-25 LAB — IRON AND TIBC
Iron: 99 ug/dL (ref 28–170)
Saturation Ratios: 27 % (ref 10.4–31.8)
TIBC: 367 ug/dL (ref 250–450)
UIBC: 268 ug/dL

## 2022-08-25 LAB — COMPREHENSIVE METABOLIC PANEL
ALT: 10 U/L (ref 0–44)
AST: 20 U/L (ref 15–41)
Albumin: 3.8 g/dL (ref 3.5–5.0)
Alkaline Phosphatase: 92 U/L (ref 38–126)
Anion gap: 10 (ref 5–15)
BUN: 28 mg/dL — ABNORMAL HIGH (ref 8–23)
CO2: 25 mmol/L (ref 22–32)
Calcium: 8.7 mg/dL — ABNORMAL LOW (ref 8.9–10.3)
Chloride: 103 mmol/L (ref 98–111)
Creatinine, Ser: 1.25 mg/dL — ABNORMAL HIGH (ref 0.44–1.00)
GFR, Estimated: 41 mL/min — ABNORMAL LOW (ref >60)
Glucose, Bld: 101 mg/dL — ABNORMAL HIGH (ref 70–99)
Potassium: 3.7 mmol/L (ref 3.5–5.1)
Sodium: 138 mmol/L (ref 135–145)
Total Bilirubin: 0.7 mg/dL (ref 0.3–1.2)
Total Protein: 7.3 g/dL (ref 6.5–8.1)

## 2022-08-25 LAB — LACTATE DEHYDROGENASE: LDH: 186 U/L (ref 98–192)

## 2022-08-25 LAB — FERRITIN: Ferritin: 153 ng/mL (ref 11–307)

## 2022-09-16 ENCOUNTER — Encounter: Payer: Self-pay | Admitting: Internal Medicine

## 2022-09-16 NOTE — Telephone Encounter (Signed)
Please see the below message. Pt daughter called and suggest that her mother see a palliative care provider. She would like for someone to give her a call back to discuss what her options are.

## 2022-09-22 ENCOUNTER — Inpatient Hospital Stay (HOSPITAL_BASED_OUTPATIENT_CLINIC_OR_DEPARTMENT_OTHER): Payer: Medicare HMO | Admitting: Hospice and Palliative Medicine

## 2022-09-22 ENCOUNTER — Inpatient Hospital Stay: Payer: Medicare HMO | Attending: Internal Medicine | Admitting: Internal Medicine

## 2022-09-22 ENCOUNTER — Other Ambulatory Visit: Payer: Self-pay

## 2022-09-22 ENCOUNTER — Encounter: Payer: Self-pay | Admitting: Hospice and Palliative Medicine

## 2022-09-22 VITALS — BP 124/57 | HR 76 | Temp 98.6°F | Resp 20

## 2022-09-22 VITALS — BP 124/57 | HR 76 | Temp 98.6°F | Resp 20 | Ht 59.0 in | Wt 117.6 lb

## 2022-09-22 DIAGNOSIS — D4102 Neoplasm of uncertain behavior of left kidney: Secondary | ICD-10-CM | POA: Diagnosis not present

## 2022-09-22 DIAGNOSIS — N183 Chronic kidney disease, stage 3 unspecified: Secondary | ICD-10-CM | POA: Diagnosis not present

## 2022-09-22 DIAGNOSIS — C9310 Chronic myelomonocytic leukemia not having achieved remission: Secondary | ICD-10-CM | POA: Diagnosis not present

## 2022-09-22 DIAGNOSIS — R5383 Other fatigue: Secondary | ICD-10-CM | POA: Insufficient documentation

## 2022-09-22 DIAGNOSIS — D649 Anemia, unspecified: Secondary | ICD-10-CM | POA: Insufficient documentation

## 2022-09-22 DIAGNOSIS — Z66 Do not resuscitate: Secondary | ICD-10-CM | POA: Insufficient documentation

## 2022-09-22 NOTE — Progress Notes (Signed)
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, white count elevated.     RBC (Red Blood Cell Count) 4.04 - 5.48 10^6/uL 3.02 Low     Hemoglobin 12.0 - 15.0 gm/dL 10.5 Low     Hematocrit 35.0 - 47.0 % 31.9 Low     MCV (Mean Corpuscular Volume) 80.0 - 100.0 fl 105.6 High     MCH (Mean Corpuscular Hemoglobin) 27.0 - 31.2 pg 34.8 High     MCHC (Mean Corpuscular Hemoglobin Concentration) 32.0 - 36.0 gm/dL 32.9    Platelet Count 150 - 450 10^3/uL 180    RDW-CV (Red Cell Distribution Width) 11.6 - 14.8 % 14.5    MPV (Mean Platelet Volume) 9.4 - 12.4 fl 9.1 Low     Neutrophils 1.50 - 7.80 10^3/uL 16.28 High     Lymphocytes 1.00 - 3.60 10^3/uL 4.61 High     Monocytes 0.00 - 1.50 10^3/uL 16.92 High     Eosinophils 0.00 - 0.55 10^3/uL 0.48    Basophils 0.00 - 0.09 10^3/uL 0.07    Neutrophil % 32.0 - 70.0 % 41.2    Lymphocyte % 10.0 - 50.0 % 11.7    Monocyte % 4.0 - 13.0 % 42.8 High   See manual diff and path review.   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    Chronic myelomonocytic leukemia not having achieved remission ([place])  01/06/2022 Initial Diagnosis   CMML (chronic myelomonocytic leukemia) (HCC)      HPI: Patient ambulating independently.   Accompanied by daughter  Amanda Rose 89 y.o.  female pleasant patient chronic myelomonocytic leukemia platelets evidence is here for follow-up.  In the interim has had no hospitalizations.  Ongoing fatigue.  However, Patient denies any worsening fatigue.  Denies any blood in stools or blood in the stools.  No nausea or vomiting.  Review of Systems  Constitutional:  Positive for malaise/fatigue. Negative for chills, [name], palpitations, orthopnea and leg swelling.  Gastrointestinal:  Negative for abdominal pain, blood in stool, constipation, diarrhea, heartburn, melena, nausea and vomiting.  Genitourinary:  Negative for dysuria, frequency and urgency.  Musculoskeletal:  Negative for joint pain.  Skin: Negative.  Negative for itching and rash.  Neurological:  Negative for dizziness, tingling, focal  weakness, weakness and headaches.  Endo/Heme/Allergies:  Does not bruise/bleed easily.  Psychiatric/Behavioral:  Negative for depression. The patient is not nervous/anxious and does not have insomnia.       PAST MEDICAL HISTORY :  Past Medical History:  Diagnosis Date   Hypothyroidism    Pre-diabetes    Thyroid disease     PAST SURGICAL HISTORY :   Past Surgical History:  Procedure Laterality Date   IR KYPHO EA ADDL LEVEL THORACIC OR LUMBAR  12/17/2021   IR KYPHO THORACIC WITH BONE BIOPSY  12/17/2021   IR RADIOLOGIST EVAL & MGMT  12/09/2021   KYPHOPLASTY N/A  04/05/2021   Procedure: T10 and L2 KYPHOPLASTY;  Surgeon: Hessie Knows, MD;  [name];  Service: Orthopedics;  [name];    FAMILY HISTORY :   Family History  Problem Relation Age of Onset   Cancer Father        unknown    SOCIAL HISTORY:   Social History   Tobacco Use   Smoking status: Never   Smokeless tobacco: Never  Vaping Use   Vaping Use: Never used  Substance Use Topics   Alcohol use: Never   Drug use: Never    ALLERGIES:  is allergic to other.  MEDICATIONS:  Current Outpatient Medications  Medication Sig Dispense Refill   cetirizine (ZYRTEC) 10 MG tablet Take 10 mg by mouth in the morning. (Patient not taking: Reported on 01/06/2022)     Cholecalciferol (VITAMIN D-3) 125 MCG (5000 UT) TABS Take 5,[name],000 mcg by mouth in the morning.     diphenhydrAMINE (SOMINEX) 25 MG tablet Take 25 mg by mouth at bedtime as needed for sleep. Sleep Aid     ibuprofen (ADVIL) 200 MG tablet Take 200-400 mg by mouth every 4 (four) hours as needed for mild pain.     levothyroxine (SYNTHROID) 100 MCG tablet Take 100 mcg by mouth daily before breakfast.     Multiple Vitamin (MULTIVITAMIN WITH MINERALS) TABS tablet Take 1 tablet by mouth in the morning. Equate Complete Multivitamin     Multiple Vitamins-Minerals (ALGAE BASED CALCIUM) TABS Take 2 tablets by mouth in the morning and at bedtime. AlgaeCal Plus Plant-Based Calcium Supplement with Magnesium Boron Vitamin K2 + D3     Multiple Vitamins-Minerals (BONE SMART PO) Take 2 tablets by mouth daily in the afternoon. Strontium Boost Natural Strontium Citrate Supplement     traMADol (ULTRAM) 50 MG tablet Take 50 mg by mouth every 6 (six) hours as needed for moderate pain. (Patient not taking: Reported on 09/22/2022)     No current facility-administered medications for this visit.    PHYSICAL EXAMINATION:  BP (!) 124/57   Pulse 76   Temp  98.6 F (37 C) (Tympanic)   Resp 20   SpO2 98%   There were no vitals filed for this visit.   Physical Exam Vitals and nursing note reviewed.  HENT:     Head: Normocephalic and atraumatic.     Mouth/Throat:     Pharynx: Oropharynx is clear.  Eyes:     Extraocular Movements: Extraocular movements intact.     Pupils: Pupils are equal, round, and reactive to light.  Cardiovascular:     Rate and Rhythm: Normal rate and regular rhythm.  Pulmonary:     Comments: Decreased breath sounds bilaterally.  Abdominal:     Palpations: Abdomen is soft.  Musculoskeletal:  General: Normal range of motion.     Cervical back: Normal range of motion.  Skin:    General: Skin is warm.  Neurological:     General: No focal deficit present.     Mental Status: She is alert and oriented to person, place, and time.  Psychiatric:        Behavior: Behavior normal.        Judgment: Judgment normal.        LABORATORY DATA:  I have reviewed the data as listed    Component Value Date/Time   NA 138 08/25/2022 0928   K 3.7 08/25/2022 0928   CL 103 08/25/2022 0928   CO2 25 08/25/2022 0928   GLUCOSE 101 (H) 08/25/2022 0928   BUN 28 (H) 08/25/2022 0928   CREATININE 1.25 (H) 08/25/2022 0928   CALCIUM 8.7 (L) 08/25/2022 0928   PROT 7.3 08/25/2022 0928   ALBUMIN 3.8 08/25/2022 0928   AST 20 08/25/2022 0928   ALT 10 08/25/2022 0928   ALKPHOS 92 08/25/2022 0928   BILITOT 0.7 08/25/2022 0928   GFRNONAA 41 (L) 08/25/2022 0928   GFRAA >60 12/03/2017 1003    No results found for: "SPEP", "UPEP"  Lab Results  Component Value Date   WBC 18.1 (H) 08/25/2022   NEUTROABS 7.0 08/25/2022   HGB 9.0 (L) 08/25/2022   HCT 27.8 (L) 08/25/2022   MCV 103.3 (H) 08/25/2022   PLT 152 08/25/2022      Chemistry      Component Value Date/Time   NA 138 08/25/2022 0928   K 3.7 08/25/2022 0928   CL 103 08/25/2022 0928   CO2 25 08/25/2022 0928   BUN 28 (H) 08/25/2022 0928   CREATININE 1.25 (H)  08/25/2022 0928      Component Value Date/Time   CALCIUM 8.7 (L) 08/25/2022 0928   ALKPHOS 92 08/25/2022 0928   AST 20 08/25/2022 0928   ALT 10 08/25/2022 0928   BILITOT 0.7 08/25/2022 0928       RADIOGRAPHIC STUDIES: I have personally reviewed the radiological images as listed and agreed with the findings in the report. No results found.   ASSESSMENT & PLAN:  Chronic myelomonocytic leukemia not having achieved remission [facility]) #Chronic myelomonocytic leukemia- April 2023-bone marrow biopsy:  Hypercellular marrow with myeloid hyperplasia and monocytosis worrisome for a myeloid  proliferative/myelodysplastic syndrome, specifically chronic  myelomonocytic leukemia;ordered myeloid panel.  NEAGTIVE for JAK2 mutation; MPL; CALR mutation.   # Patient continues to be asymptomatic.  Discussed the potential signs and symptoms of progressive leukemia-include weight loss night sweats increase risk of bleeding; worsening fatigue etc.  Discussed the treatment options for leukemia include Hydrea/decitabine.  However as patient is asymptomatic would recommend surveillance at this time. Stable.   # Mild Anemia-hemoglobin 9-10 likely secondary above. DEC 2023- iron sat-27: Ferritin- 153- discussed re: Retacrit/ possible venofer-  Monitor for now. Stable.   # fatigue: Sec to CMML/anemia- discussed re: ginseng; consider Anti-depressant [PCP]; consider exercise.   # CKD stage III [43]- Stable.    # Palliative care evaluation: S/p evaluation with Josh Borders.  Discussed with [employer].  # DISPOSITION: # follow up in 4 months;1 WEEK PRIOR labs- cbc/cmp; LDH; iron studies; ferritin - -Dr.B   Orders Placed This Encounter  Procedures   Lactate dehydrogenase    Standing Status:   Future    Standing Expiration Date:   09/23/2023   All questions were answered. The patient knows to call the clinic with any problems, questions or  concerns.      Cammie Sickle, MD 09/28/2022 4:47 PM

## 2022-09-22 NOTE — Progress Notes (Signed)
[place] [facility] [phone] [phone]   Name: Amanda Rose Date: 09/22/2022 MRN: [id number]  DOB: [birth year]  Patient Care Team: Dion Body, MD as PCP - General (Family Medicine) Cammie Sickle, MD as Consulting Physician (Oncology)    REASON FOR CONSULTATION: Amanda Rose is a 89 y.o. 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  Patient had abdominal ultrasound in August 2023 with findings of an isoechoic mass in the left kidney.  Patient opted to forego further imaging workup.  Family requested palliative care consultation.  SOCIAL HISTORY:     reports that she has never smoked. She has never used smokeless tobacco. She reports that she does not drink alcohol and does not use drugs.  Patient is widowed.  She lives at home with her daughter, grandchildren, and Designer, industrial/product.  Patient has 3 daughters and a son.  Patient worked as a Psychologist, occupational.  ADVANCE DIRECTIVES:  Not on file  CODE STATUS: DNR  PAST MEDICAL HISTORY: Past Medical History:  Diagnosis Date   Hypothyroidism    Pre-diabetes    Thyroid disease     PAST SURGICAL HISTORY:  Past Surgical History:  Procedure Laterality Date   IR KYPHO EA ADDL LEVEL THORACIC OR LUMBAR  12/17/2021   IR KYPHO THORACIC WITH BONE BIOPSY  12/17/2021   IR RADIOLOGIST EVAL & MGMT  12/09/2021   KYPHOPLASTY N/A 04/05/2021   Procedure: T10 and L2 KYPHOPLASTY;  Surgeon: Hessie Knows, MD;  [name];  Service: Orthopedics;  [name];    HEMATOLOGY/ONCOLOGY HISTORY:  Oncology History Overview Note  April 2023- BONE MARROW, ASPIRATE, CLOT, CORE:  -  Hypercellular marrow with myeloid hyperplasia and monocytosis  -  See comment and microscopic description below   PERIPHERAL BLOOD:  -  Macrocytic anemia and monocytosis  -  See complete blood cell count   COMMENT:   The findings in the  marrow are 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    CMML (chronic myelomonocytic leukemia) ([place])  01/06/2022 Initial Diagnosis   CMML (chronic myelomonocytic leukemia) (HCC)     ALLERGIES:  is allergic to other.  MEDICATIONS:  Current Outpatient Medications  Medication Sig Dispense Refill   cetirizine (ZYRTEC) 10 MG tablet Take 10 mg by mouth in the morning. (Patient not taking: Reported on 01/06/2022)     Cholecalciferol (VITAMIN D-3) 125 MCG (5000 UT) TABS Take 5,[name],000 mcg by mouth in the morning.     diphenhydrAMINE (SOMINEX) 25 MG tablet Take 25 mg by mouth at bedtime as needed for sleep. Sleep Aid     ibuprofen (ADVIL) 200 MG tablet Take 200-400 mg by mouth every 4 (four) hours as needed for mild pain.     levothyroxine (SYNTHROID) 100 MCG tablet Take 100 mcg by mouth daily before breakfast.     Multiple Vitamin (MULTIVITAMIN WITH MINERALS) TABS tablet Take 1 tablet by mouth in the morning. Equate Complete Multivitamin     Multiple Vitamins-Minerals (ALGAE BASED CALCIUM) TABS Take 2 tablets by mouth in the morning and at bedtime. AlgaeCal Plus Plant-Based Calcium Supplement with Magnesium Boron Vitamin K2 + D3     Multiple Vitamins-Minerals (BONE SMART PO) Take 2 tablets by mouth daily in the afternoon. Strontium Boost Natural Strontium Citrate Supplement  traMADol (ULTRAM) 50 MG tablet Take 50 mg by mouth every 6 (six) hours as needed for moderate pain.     No current facility-administered medications for this visit.    VITAL SIGNS: There were no vitals taken for this visit. There were no vitals filed for this visit.  Estimated body mass index is 23.35 kg/m as calculated from the following:   Height as of 04/08/22: '4\' 11"'$  (1.499 m).   Weight as of 04/08/22: 115 lb 9.6 oz (52.4  kg).  LABS: CBC:    Component Value Date/Time   WBC 18.1 (H) 08/25/2022 0928   HGB 9.0 (L) 08/25/2022 0928   HCT 27.8 (L) 08/25/2022 0928   PLT 152 08/25/2022 0928   MCV 103.3 (H) 08/25/2022 0928   NEUTROABS 7.0 08/25/2022 0928   LYMPHSABS 2.6 08/25/2022 0928   MONOABS 8.1 (H) 08/25/2022 0928   EOSABS 0.2 08/25/2022 0928   BASOSABS 0.0 08/25/2022 0928   Comprehensive Metabolic Panel:    Component Value Date/Time   NA 138 08/25/2022 0928   K 3.7 08/25/2022 0928   CL 103 08/25/2022 0928   CO2 25 08/25/2022 0928   BUN 28 (H) 08/25/2022 0928   CREATININE 1.25 (H) 08/25/2022 0928   GLUCOSE 101 (H) 08/25/2022 0928   CALCIUM 8.7 (L) 08/25/2022 0928   AST 20 08/25/2022 0928   ALT 10 08/25/2022 0928   ALKPHOS 92 08/25/2022 0928   BILITOT 0.7 08/25/2022 0928   PROT 7.3 08/25/2022 0928   ALBUMIN 3.8 08/25/2022 0928    RADIOGRAPHIC STUDIES: No results found.  PERFORMANCE STATUS (ECOG) : 2 - Symptomatic, <50% confined to bed  Review of Systems Unless otherwise noted, a complete review of systems is negative.  Physical Exam General: NAD Pulmonary: Unlabored Extremities: no edema, no joint deformities Skin: no rashes Neurological: Weakness but otherwise nonfocal  IMPRESSION: I met with patient and her daughter.  I introduced palliative care services and attempted to establish therapeutic rapport.  Patient says that she is doing well.  She denies any significant changes or concerns.  She says that she has good quality of life and is still independent, although some of her daily care takes longer than he used to.  She drove recently to church.  She says that she enjoys traveling to her [place] and [place].  Patient says her primary goal is to maximize quality of life.  Patient denies any distressing symptoms at present.  Her daughter reports worse fatigue recently.  Daughter says that patient has responded well to steroids in the past, although we discussed the  potential concerns for chronic steroid use.  Recommended that she could trial American ginseng.   Patient says that she has completed ACP documents.  Her daughter, Amanda Rose, is her HCPOA.  Patient says she is also completed a living will and does not want any resuscitation aggressive measures at end-of-life.  She says clearly that she would want to be a DNR/DNI.  She says that she thinks she has a DNR order at home.  PLAN: -Continue current scope of treatment -DNR/DNI -Follow-up telephone visit 3 to 4 months  Case and plan discussed with Dr. Rogue Bussing  Patient expressed understanding and was in agreement with this plan. She also understands that She can call the clinic at any time with any questions, concerns, or complaints.     Time Total: 15 minutes  Visit consisted of counseling and education dealing with the complex and emotionally intense issues of symptom management and palliative care  in the setting of serious and potentially life-threatening illness.Greater than 50%  of this time was spent counseling and coordinating care related to the above assessment and plan.  Signed by: Altha Harm, PhD, NP-C

## 2022-09-22 NOTE — Assessment & Plan Note (Addendum)
#  Chronic myelomonocytic leukemia- April 2023-bone marrow biopsy:  Hypercellular marrow with myeloid hyperplasia and monocytosis worrisome for a myeloid  proliferative/myelodysplastic syndrome, specifically chronic  myelomonocytic leukemia;ordered myeloid panel.  NEAGTIVE for JAK2 mutation; MPL; CALR mutation.   # Patient continues to be asymptomatic.  Discussed the potential signs and symptoms of progressive leukemia-include weight loss night sweats increase risk of bleeding; worsening fatigue etc.  Discussed the treatment options for leukemia include Hydrea/decitabine.  However as patient is asymptomatic would recommend surveillance at this time. Stable.   # Mild Anemia-hemoglobin 9-10 likely secondary above. DEC 2023- iron sat-27: Ferritin- 153- discussed re: Retacrit/ possible venofer-  Monitor for now. Stable.   # fatigue: Sec to CMML/anemia- discussed re: ginseng; consider Anti-depressant [PCP]; consider exercise.   # CKD stage III [43]- Stable.    # Palliative care evaluation: S/p evaluation with Josh Borders.  Discussed with [employer].  # DISPOSITION: # follow up in 4 months;1 WEEK PRIOR labs- cbc/cmp; LDH; iron studies; ferritin - -Dr.B

## 2022-09-24 ENCOUNTER — Telehealth: Payer: Self-pay | Admitting: *Deleted

## 2022-09-24 NOTE — Telephone Encounter (Signed)
Patient daughter [name] called stating that they were told to get American Ginseng to boost her energy. She has questions regarding dosing and things she has read online about it and she is requesting a return call from [employer] to discuss

## 2022-09-24 NOTE — Telephone Encounter (Signed)
I called and spoke with patient's daughter.  Questions answered to her verbalized satisfaction.

## 2022-09-28 ENCOUNTER — Encounter: Payer: Self-pay | Admitting: Internal Medicine

## 2023-01-15 ENCOUNTER — Other Ambulatory Visit: Payer: Self-pay | Admitting: Physician Assistant

## 2023-01-15 DIAGNOSIS — Z8781 Personal history of (healed) traumatic fracture: Secondary | ICD-10-CM

## 2023-01-15 DIAGNOSIS — Z8739 Personal history of other diseases of the musculoskeletal system and connective tissue: Secondary | ICD-10-CM

## 2023-01-15 DIAGNOSIS — M5441 Lumbago with sciatica, right side: Secondary | ICD-10-CM

## 2023-01-16 ENCOUNTER — Other Ambulatory Visit: Payer: Self-pay | Admitting: *Deleted

## 2023-01-16 DIAGNOSIS — C9310 Chronic myelomonocytic leukemia not having achieved remission: Secondary | ICD-10-CM

## 2023-01-19 ENCOUNTER — Inpatient Hospital Stay: Payer: Medicare HMO

## 2023-01-21 ENCOUNTER — Inpatient Hospital Stay: Payer: Medicare HMO | Admitting: Hospice and Palliative Medicine

## 2023-01-23 ENCOUNTER — Inpatient Hospital Stay: Payer: Medicare HMO | Admitting: Internal Medicine

## 2023-02-03 ENCOUNTER — Inpatient Hospital Stay: Payer: Medicare HMO | Attending: Internal Medicine

## 2023-02-03 DIAGNOSIS — C9310 Chronic myelomonocytic leukemia not having achieved remission: Secondary | ICD-10-CM | POA: Insufficient documentation

## 2023-02-03 DIAGNOSIS — N183 Chronic kidney disease, stage 3 unspecified: Secondary | ICD-10-CM | POA: Insufficient documentation

## 2023-02-03 LAB — CMP (CANCER CENTER ONLY)
ALT: 13 U/L (ref 0–44)
AST: 22 U/L (ref 15–41)
Albumin: 4 g/dL (ref 3.5–5.0)
Alkaline Phosphatase: 97 U/L (ref 38–126)
Anion gap: 9 (ref 5–15)
BUN: 40 mg/dL — ABNORMAL HIGH (ref 8–23)
CO2: 26 mmol/L (ref 22–32)
Calcium: 9.3 mg/dL (ref 8.9–10.3)
Chloride: 103 mmol/L (ref 98–111)
Creatinine: 1.18 mg/dL — ABNORMAL HIGH (ref 0.44–1.00)
GFR, Estimated: 44 mL/min — ABNORMAL LOW (ref >60)
Glucose, Bld: 85 mg/dL (ref 70–99)
Potassium: 3.8 mmol/L (ref 3.5–5.1)
Sodium: 138 mmol/L (ref 135–145)
Total Bilirubin: 0.4 mg/dL (ref 0.3–1.2)
Total Protein: 7.4 g/dL (ref 6.5–8.1)

## 2023-02-03 LAB — CBC WITH DIFFERENTIAL (CANCER CENTER ONLY)
Abs Immature Granulocytes: 0.25 10*3/uL — ABNORMAL HIGH (ref 0.00–0.07)
Basophils Absolute: 0.2 10*3/uL — ABNORMAL HIGH (ref 0.0–0.1)
Basophils Relative: 1 %
Eosinophils Absolute: 0.3 10*3/uL (ref 0.0–0.5)
Eosinophils Relative: 1 %
HCT: 30.7 % — ABNORMAL LOW (ref 36.0–46.0)
Hemoglobin: 10.1 g/dL — ABNORMAL LOW (ref 12.0–15.0)
Immature Granulocytes: 1 %
Lymphocytes Relative: 11 %
Lymphs Abs: 2.6 10*3/uL (ref 0.7–4.0)
MCH: 33.6 pg (ref 26.0–34.0)
MCHC: 32.9 g/dL (ref 30.0–36.0)
MCV: 102 fL — ABNORMAL HIGH (ref 80.0–100.0)
Monocytes Absolute: 12.2 10*3/uL — ABNORMAL HIGH (ref 0.1–1.0)
Monocytes Relative: 49 %
Neutro Abs: 9 10*3/uL — ABNORMAL HIGH (ref 1.7–7.7)
Neutrophils Relative %: 37 %
Platelet Count: 170 10*3/uL (ref 150–400)
RBC: 3.01 MIL/uL — ABNORMAL LOW (ref 3.87–5.11)
RDW: 13.2 % (ref 11.5–15.5)
WBC Count: 24.6 10*3/uL — ABNORMAL HIGH (ref 4.0–10.5)
nRBC: 0 % (ref 0.0–0.2)

## 2023-02-03 LAB — IRON AND TIBC
Iron: 104 ug/dL (ref 28–170)
Saturation Ratios: 29 % (ref 10.4–31.8)
TIBC: 357 ug/dL (ref 250–450)
UIBC: 253 ug/dL

## 2023-02-03 LAB — FERRITIN: Ferritin: 139 ng/mL (ref 11–307)

## 2023-02-03 LAB — LACTATE DEHYDROGENASE: LDH: 197 U/L — ABNORMAL HIGH (ref 98–192)

## 2023-02-05 ENCOUNTER — Other Ambulatory Visit: Payer: Self-pay

## 2023-02-05 DIAGNOSIS — D649 Anemia, unspecified: Secondary | ICD-10-CM

## 2023-02-06 ENCOUNTER — Inpatient Hospital Stay (HOSPITAL_BASED_OUTPATIENT_CLINIC_OR_DEPARTMENT_OTHER): Payer: Medicare HMO | Admitting: Internal Medicine

## 2023-02-06 ENCOUNTER — Encounter: Payer: Self-pay | Admitting: Internal Medicine

## 2023-02-06 VITALS — BP 141/71 | HR 84 | Temp 97.8°F | Ht 59.0 in | Wt 111.6 lb

## 2023-02-06 DIAGNOSIS — C9310 Chronic myelomonocytic leukemia not having achieved remission: Secondary | ICD-10-CM | POA: Diagnosis not present

## 2023-02-06 NOTE — Progress Notes (Signed)
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, white count elevated.     RBC (Red Blood Cell Count) 4.04 - 5.48 10^6/uL 3.02 Low     Hemoglobin 12.0 - 15.0 gm/dL 16.1 Low     Hematocrit 35.0 - 47.0 % 31.9 Low     MCV (Mean Corpuscular Volume) 80.0 - 100.0 fl 105.6 High     MCH (Mean Corpuscular Hemoglobin) 27.0 - 31.2 pg 34.8 High     MCHC (Mean Corpuscular Hemoglobin Concentration) 32.0 - 36.0 gm/dL 09.6    Platelet Count 150 - 450 10^3/uL 180    RDW-CV (Red Cell Distribution Width) 11.6 - 14.8 % 14.5    MPV (Mean Platelet Volume) 9.4 - 12.4 fl 9.1 Low     Neutrophils 1.50 - 7.80 10^3/uL 16.28 High     Lymphocytes 1.00 - 3.60 10^3/uL 4.61 High     Monocytes 0.00 - 1.50 10^3/uL 16.92 High     Eosinophils 0.00 - 0.55 10^3/uL 0.48    Basophils 0.00 - 0.09 10^3/uL 0.07    Neutrophil % 32.0 - 70.0 % 41.2    Lymphocyte % 10.0 - 50.0 % 11.7    Monocyte % 4.0 - 13.0 % 42.8 High   See manual diff and path review.   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   Accompanied by daughter  CARLITA MCPHAIL 89 y.o.  female pleasant chronic myelomonocytic leukemia platelets evidence is here for follow-up.   C/o chronic feet going numb x2 months.    She states she was in the hospital at [facility] for her back-currently undergoing physical therapy.  The back pain is improving.  In the interim has had no hospitalizations.  Ongoing fatigue.  However, Patient denies any worsening fatigue.  Denies any blood in stools or blood in the stools.  No nausea or vomiting.  Review of Systems  Constitutional:  Positive for malaise/fatigue. Negative for chills, [name], palpitations, orthopnea and leg swelling.  Gastrointestinal:  Negative for abdominal pain, blood in stool, constipation, diarrhea, heartburn, melena, nausea and  vomiting.  Genitourinary:  Negative for dysuria, frequency and urgency.  Musculoskeletal:  Negative for joint pain.  Skin: Negative.  Negative for itching and rash.  Neurological:  Negative for dizziness, tingling, focal weakness, weakness and headaches.  Endo/Heme/Allergies:  Does not bruise/bleed easily.  Psychiatric/Behavioral:  Negative for depression. The patient is not nervous/anxious and does not have insomnia.       PAST MEDICAL HISTORY :  Past Medical History:  Diagnosis Date   Hypothyroidism    Pre-diabetes    Thyroid disease     PAST SURGICAL HISTORY :   Past Surgical History:  Procedure  Laterality Date   IR KYPHO EA ADDL LEVEL THORACIC OR LUMBAR  12/17/2021   IR KYPHO THORACIC WITH BONE BIOPSY  12/17/2021   IR RADIOLOGIST EVAL & MGMT  12/09/2021   KYPHOPLASTY N/A 04/05/2021   Procedure: T10 and L2 KYPHOPLASTY;  [name], MD;  [name];  Service: Orthopedics;  [name];    FAMILY HISTORY :   Family History  Problem Relation Age of Onset   Cancer Father        unknown    SOCIAL HISTORY:   Social History   Tobacco Use   Smoking status: Never   Smokeless tobacco: Never  Vaping Use   Vaping Use: Never used  Substance Use Topics   Alcohol use: Never   Drug use: Never    ALLERGIES:  is allergic to other.  MEDICATIONS:  Current Outpatient Medications  Medication Sig Dispense Refill   cetirizine (ZYRTEC) 10 MG tablet Take 10 mg by mouth in the morning.     Cholecalciferol (VITAMIN D-3) 125 MCG (5000 UT) TABS Take 5,[name],000 mcg by mouth in the morning.     diphenhydrAMINE (SOMINEX) 25 MG tablet Take 25 mg by mouth at bedtime as needed for sleep. Sleep Aid     ibuprofen (ADVIL) 200 MG tablet Take 200-400 mg by mouth every 4 (four) hours as needed for mild pain.     levothyroxine (SYNTHROID) 100 MCG tablet Take 100 mcg by mouth daily before breakfast.     Magnesium 100 MG CAPS Take by mouth.     Multiple Vitamin (MULTIVITAMIN WITH MINERALS) TABS tablet Take 1 tablet by mouth in the morning. Equate Complete Multivitamin     Multiple Vitamins-Minerals (ALGAE BASED CALCIUM) TABS Take 2 tablets by mouth in the morning and at bedtime. AlgaeCal Plus Plant-Based Calcium Supplement with Magnesium Boron Vitamin K2 + D3     Multiple Vitamins-Minerals (BONE SMART PO) Take 2 tablets by mouth daily in the afternoon. Strontium Boost Natural Strontium Citrate Supplement     traMADol (ULTRAM) 50 MG tablet Take 50 mg by mouth every 6 (six) hours as needed for moderate pain.      No current facility-administered medications for this visit.    PHYSICAL EXAMINATION:  BP (!) 141/71 (BP Location: Left Arm, Patient Position: Sitting, Cuff Size: Normal)   Pulse 84   Temp 97.8 F (36.6 C) (Tympanic)   Ht 4\' 11"  (1.499 m)   Wt 111 lb 9.6 oz (50.6 kg)   SpO2 99%   BMI 22.54 kg/m   Filed Weights   02/06/23 1019  Weight: 111 lb 9.6 oz (50.6 kg)     Physical Exam Vitals and nursing note reviewed.  HENT:     Head: Normocephalic and atraumatic.     Mouth/Throat:  Pharynx: Oropharynx is clear.  Eyes:     Extraocular Movements: Extraocular movements intact.     Pupils: Pupils are equal, round, and reactive to light.  Cardiovascular:     Rate and Rhythm: Normal rate and regular rhythm.  Pulmonary:     Comments: Decreased breath sounds bilaterally.  Abdominal:     Palpations: Abdomen is soft.  Musculoskeletal:        General: Normal range of motion.     Cervical back: Normal range of motion.  Skin:    General: Skin is warm.  Neurological:     General: No focal deficit present.     Mental Status: She is alert and oriented to person, place, and time.  Psychiatric:        Behavior: Behavior normal.        Judgment: Judgment normal.        LABORATORY DATA:  I have reviewed the data as listed    Component Value Date/Time   NA 138 02/03/2023 1053   K 3.8 02/03/2023 1053   CL 103 02/03/2023 1053   CO2 26 02/03/2023 1053   GLUCOSE 85 02/03/2023 1053   BUN 40 (H) 02/03/2023 1053   CREATININE 1.18 (H) 02/03/2023 1053   CALCIUM 9.3 02/03/2023 1053   PROT 7.4 02/03/2023 1053   ALBUMIN 4.0 02/03/2023 1053   AST 22 02/03/2023 1053   ALT 13 02/03/2023 1053   ALKPHOS 97 02/03/2023 1053   BILITOT 0.4 02/03/2023 1053   GFRNONAA 44 (L) 02/03/2023 1053   GFRAA >60 12/03/2017 1003    No results found for: "SPEP", "UPEP"  Lab Results  Component Value Date   WBC 24.6 (H) 02/03/2023   NEUTROABS 9.0 (H) 02/03/2023   HGB 10.1 (L) 02/03/2023   HCT  30.7 (L) 02/03/2023   MCV 102.0 (H) 02/03/2023   PLT 170 02/03/2023      Chemistry      Component Value Date/Time   NA 138 02/03/2023 1053   K 3.8 02/03/2023 1053   CL 103 02/03/2023 1053   CO2 26 02/03/2023 1053   BUN 40 (H) 02/03/2023 1053   CREATININE 1.18 (H) 02/03/2023 1053      Component Value Date/Time   CALCIUM 9.3 02/03/2023 1053   ALKPHOS 97 02/03/2023 1053   AST 22 02/03/2023 1053   ALT 13 02/03/2023 1053   BILITOT 0.4 02/03/2023 1053       RADIOGRAPHIC STUDIES: I have personally reviewed the radiological images as listed and agreed with the findings in the report. No results found.   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  NEAGTIVE for JAK2 mutation; MPL; CALR mutation.   # Reviewed that May 2024-white count rising 24; stable at 10 platelets normal- Patient continues to be asymptomatic.  Discussed the potential signs and symptoms of progressive leukemia-include weight loss night sweats increase risk of bleeding; worsening fatigue etc.  Discussed the treatment options for leukemia include Hydrea/decitabine.  However as patient is asymptomatic [except for weight loss/ fatigue- see below]would recommend surveillance at this time. Stable.   # Mild Anemia-hemoglobin  10- likely secondary above. MAY 2024- iron sat-29 Ferritin- 139.  Hold off Retacrit/ possible venofer-.  Monitor for now. Stable.   # fatigue: Sec to CMML/anemia-continue ginseng; consider Anti-depressant [PCP]; consider exercise- stable  #  CKD stage III [43]- stable  # weight loss: Discussed regarding protein intake/muscle health.  # refer to nutrition/Joli re: weight loss  # Palliative care evaluation:  S/p evaluation with Josh Borders.    # DISPOSITION: #  refer to nutrition/Joli re: weight loss # follow up in 4 months;1 WEEK PRIOR labs- cbc/cmp; LDH; iron studies; ferritin - -Dr.B   Orders Placed This Encounter  Procedures   CBC with Differential (Cancer Center Only)    Standing Status:   Future    Standing Expiration Date:   02/06/2024   CMP (Cancer Center only)    Standing Status:   Future    Standing Expiration Date:   02/06/2024   Iron and TIBC    Standing Status:   Future    Standing Expiration Date:   02/06/2024   Ferritin    Standing Status:   Future    Standing Expiration Date:   02/06/2024   Lactate dehydrogenase    Standing Status:   Future    Standing Expiration Date:   02/06/2024   Ambulatory Referral to [facility] Nutrition    Referral Priority:   Routine    Referral Type:   Consultation    Referral Reason:   Specialty Services Required    Number of Visits Requested:   1   All questions were answered. The patient knows to call the clinic with any problems, questions or concerns.      Earna Coder, MD 02/06/2023 11:16 AM

## 2023-02-06 NOTE — Progress Notes (Signed)
C/o of on-going cough.  C/o feet going numb x2 months.  She states she was in the hospital at [facility] for her back.

## 2023-02-06 NOTE — Assessment & Plan Note (Addendum)
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  NEAGTIVE for JAK2 mutation; MPL; CALR mutation.   # Reviewed that May 2024-white count rising 24; stable at 10 platelets normal- Patient continues to be asymptomatic.  Discussed the potential signs and symptoms of progressive leukemia-include weight loss night sweats increase risk of bleeding; worsening fatigue etc.  Discussed the treatment options for leukemia include Hydrea/decitabine.  However as patient is asymptomatic [except for weight loss/ fatigue- see below]would recommend surveillance at this time. Stable.   # Mild Anemia-hemoglobin  10- likely secondary above. MAY 2024- iron sat-29 Ferritin- 139.  Hold off Retacrit/ possible venofer-.  Monitor for now. Stable.   # fatigue: Sec to CMML/anemia-continue ginseng; consider Anti-depressant [PCP]; consider exercise- stable  # CKD stage III [43]- stable  # weight loss: Discussed regarding protein intake/muscle health.  # refer to nutrition/Joli re: weight loss  # Palliative care evaluation: S/p evaluation with Josh Borders.    # DISPOSITION: #  refer to nutrition/Joli re: weight loss # follow up in 4 months;1 WEEK PRIOR labs- cbc/cmp; LDH; iron studies; ferritin - -Dr.B

## 2023-02-11 ENCOUNTER — Encounter: Payer: Self-pay | Admitting: Internal Medicine

## 2023-02-11 NOTE — Telephone Encounter (Signed)
Spoke to the patient's daughter [name] regarding-prognosis-given high risk CMML-median survival appx 2 years [diagnosed 2023 April].  Continue follow-up/surveillance for now.  GB

## 2023-02-12 ENCOUNTER — Inpatient Hospital Stay: Payer: Medicare HMO | Attending: Internal Medicine

## 2023-02-12 NOTE — Progress Notes (Signed)
Nutrition Assessment   Reason for Assessment:  Weight loss    ASSESSMENT:  89 year old female with chronic myelomonocytic leukemia.  Currently under surveillance.  Past medical history of hypothyroidism, pre-DM.    Met with patient and daughter in clinic.  Reports that she has not felt well today or on Tuesday with chills, nausea, dizziness.  Felt well yesterday.  Has been awake since 2:30am this morning.  Was at a wedding this weekend with family.  Reports that she is eating but not as much as before.  Does not have much of an appetite.  Has been trying recently to increase what she is eating.  Has started drinking boost shakes.  Typically has breakfast of egg and toast or cereal fruit or pancakes with juice or milk.  Lunch yesterday was potato salad, chicken salad and fruit parfait.  Dinner last night was ham and green beans.  Reports loose bowel movement about 2-3 a day (small amount).    Patient lives with daughter and her daughter's family.     Medications: Magnesium, Vit D, Vit B 12, MVI   Labs: BUN 40, creatinine 1.18   Anthropometrics:   Height: 4 ft 11 inches Weight: 111 lb 9.6 oz  117 lb 9.6 oz on 1/15 BMI: 22  5% weight loss in the last 5 months, concerning  Estimated Energy Needs  Kcals: 1250-1500 Protein: 62-75 g Fluid: 1250-1500 ml   NUTRITION DIAGNOSIS: Inadequate oral intake related to chronic illness as evidenced by 5% weight loss in the last 5 months and decreased intake   INTERVENTION:  Discussed ways to add calories and protein in diet. Handout provided Recommend 350 calorie oral nutrition supplement or higher at least 1 time a day Samples of ensure complete and boost plus given along with coupons Encouraged mini meal/snack within 1 hour of waking and then q 2 hours Encouraged patient to weigh at least weekly at home to make sure not losing weight Cautioned patient with adding additional fat in diet to monitor GI symptoms.   Contact information  provided.  Patient prefers to reach out to RD if needed in the future Spoke with nursing regarding patient symptoms who discussed with provider.  Nursing to check vitals and make further recommendations regarding symptoms.     Next Visit: no follow-up scheduled Patient will contact RD if needed  Jannell Franta B. Freida Busman, RD, LDN Registered Dietitian [phone]

## 2023-02-12 NOTE — Progress Notes (Signed)
Patient here in clinic c/o of chills & nausea states she was around 100 + people at a wedding, per MD check vitals. VSS. T 98.4 BP 124 /57  P 78 o2 95 advised to f/u PCP/ urgent care. Patient and family member agrees to f/u urgent care. Discharged

## 2023-03-25 ENCOUNTER — Telehealth: Payer: Self-pay | Admitting: *Deleted

## 2023-03-25 NOTE — Telephone Encounter (Signed)
I returned the phone call to patient. RN offered smc apt today with Sarah, PA, but patient doesn't have a ride. She prefers to come tomorrow afternoon. I made the apt with Josh at 2pm.

## 2023-03-25 NOTE — Telephone Encounter (Signed)
Patient called reporting that she has little red itchy spots all over her body. On further questioning, she states that they are raised, they start out looking like a pimple, burst and then are red. She denies anything new such as food or soap or laundry detergent. She is asking what she can do for it. Please advise

## 2023-03-26 ENCOUNTER — Inpatient Hospital Stay: Payer: Medicare HMO

## 2023-03-26 ENCOUNTER — Other Ambulatory Visit: Payer: Self-pay

## 2023-03-26 ENCOUNTER — Encounter: Payer: Self-pay | Admitting: Hospice and Palliative Medicine

## 2023-03-26 ENCOUNTER — Inpatient Hospital Stay: Payer: Medicare HMO | Attending: Internal Medicine | Admitting: Hospice and Palliative Medicine

## 2023-03-26 VITALS — BP 116/59 | HR 87 | Temp 98.6°F | Wt 111.0 lb

## 2023-03-26 DIAGNOSIS — C9310 Chronic myelomonocytic leukemia not having achieved remission: Secondary | ICD-10-CM | POA: Insufficient documentation

## 2023-03-26 DIAGNOSIS — R21 Rash and other nonspecific skin eruption: Secondary | ICD-10-CM | POA: Insufficient documentation

## 2023-03-26 DIAGNOSIS — D649 Anemia, unspecified: Secondary | ICD-10-CM

## 2023-03-26 DIAGNOSIS — R531 Weakness: Secondary | ICD-10-CM | POA: Insufficient documentation

## 2023-03-26 LAB — CBC WITH DIFFERENTIAL/PLATELET
Abs Immature Granulocytes: 0.51 10*3/uL — ABNORMAL HIGH (ref 0.00–0.07)
Basophils Absolute: 0.2 10*3/uL — ABNORMAL HIGH (ref 0.0–0.1)
Basophils Relative: 1 %
Eosinophils Absolute: 0.6 10*3/uL — ABNORMAL HIGH (ref 0.0–0.5)
Eosinophils Relative: 2 %
HCT: 27.7 % — ABNORMAL LOW (ref 36.0–46.0)
Hemoglobin: 9.3 g/dL — ABNORMAL LOW (ref 12.0–15.0)
Immature Granulocytes: 2 %
Lymphocytes Relative: 9 %
Lymphs Abs: 2.7 10*3/uL (ref 0.7–4.0)
MCH: 33.8 pg (ref 26.0–34.0)
MCHC: 33.6 g/dL (ref 30.0–36.0)
MCV: 100.7 fL — ABNORMAL HIGH (ref 80.0–100.0)
Monocytes Absolute: 14.8 10*3/uL — ABNORMAL HIGH (ref 0.1–1.0)
Monocytes Relative: 48 %
Neutro Abs: 11.7 10*3/uL — ABNORMAL HIGH (ref 1.7–7.7)
Neutrophils Relative %: 38 %
Platelets: 151 10*3/uL (ref 150–400)
RBC: 2.75 MIL/uL — ABNORMAL LOW (ref 3.87–5.11)
RDW: 13.8 % (ref 11.5–15.5)
Smear Review: NORMAL
WBC: 30.5 10*3/uL — ABNORMAL HIGH (ref 4.0–10.5)
nRBC: 0 % (ref 0.0–0.2)

## 2023-03-26 MED ORDER — TRIAMCINOLONE ACETONIDE 0.5 % EX OINT: 1.0000 | TOPICAL_OINTMENT | Freq: Two times a day (BID) | CUTANEOUS | 0 refills | Status: DC

## 2023-03-26 NOTE — Progress Notes (Signed)
Symptom Management Clinic [facility] [phone] [phone]  Patient Care Team: Marisue Ivan, [name], MD as Consulting Physician (Oncology)   NAME OF PATIENT: Amanda Rose  [id number]  [birth year]   DATE OF VISIT: 03/26/23  REASON FOR CONSULT: Amanda Rose is a 89 y.o. 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   INTERVAL HISTORY: Patient presents [facility] today for evaluation of a rash.  Patient reports that she has had a pruritic rash for the past 4 days.  She noticed it first on her ankles and legs and is also noticed it now on her abdomen.  She says that it starts as small red itchy bumps that within blister and pop.  It is not widely disseminated.    She denies sick contacts.  She does have animals in the home.  No changes in lotions or creams or medications.  Denies history of rashes.  She denies fever or chills.  Does have fatigue chronically. No muscle or joint pain. No GI or GU symptoms.  No easy bleeding or bruising.  Reports good appetite and denies weight loss.  No chest pain.  Patient offers no further specific complaints today.  PAST MEDICAL HISTORY: Past Medical History:  Diagnosis Date   Hypothyroidism    Pre-diabetes    Thyroid disease     PAST SURGICAL HISTORY:  Past Surgical History:  Procedure Laterality Date   IR KYPHO EA ADDL LEVEL THORACIC OR LUMBAR  12/17/2021   IR KYPHO THORACIC WITH BONE BIOPSY  12/17/2021   IR RADIOLOGIST EVAL & MGMT  12/09/2021   KYPHOPLASTY N/A 04/05/2021   Procedure: T10 and L2 KYPHOPLASTY;  [name], MD;  [name];  Service: Orthopedics;  [name];    HEMATOLOGY/ONCOLOGY HISTORY:  Oncology History Overview Note  April 2023- BONE MARROW, ASPIRATE, CLOT, CORE:  -  Hypercellular marrow with myeloid hyperplasia and monocytosis  -  See comment and microscopic  description below   PERIPHERAL BLOOD:  -  Macrocytic anemia and monocytosis  -  See complete blood cell count   COMMENT:   The findings in the marrow are 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  MEDICATIONS:  Current Outpatient Medications  Medication Sig Dispense Refill   cetirizine (ZYRTEC) 10 MG tablet Take 10 mg by mouth in the morning.     Cholecalciferol (VITAMIN D-3) 125 MCG (5000 UT) TABS Take 5,[name],000 mcg by mouth in the morning.     diphenhydrAMINE (SOMINEX) 25 MG tablet Take 25 mg by mouth at bedtime as needed for sleep. Sleep Aid     ibuprofen (ADVIL) 200 MG tablet Take 200-400 mg by mouth every 4 (four) hours as needed for mild pain.     levothyroxine (SYNTHROID) 100 MCG tablet Take 100 mcg by mouth daily before breakfast.     Magnesium 100 MG CAPS Take by mouth.     Multiple Vitamin (MULTIVITAMIN WITH MINERALS) TABS tablet Take 1 tablet by mouth in the morning. Equate Complete Multivitamin     Multiple Vitamins-Minerals (ALGAE BASED CALCIUM) TABS  Take 2 tablets by mouth in the morning and at bedtime. AlgaeCal Plus Plant-Based Calcium Supplement with Magnesium Boron Vitamin K2 + D3     Multiple Vitamins-Minerals (BONE SMART PO) Take 2 tablets by mouth daily in the afternoon. Strontium Boost Natural Strontium Citrate Supplement     traMADol (ULTRAM) 50 MG tablet Take 50 mg by mouth every 6 (six) hours as needed for moderate pain.     No current facility-administered medications for this visit.    VITAL SIGNS: There were no vitals taken for this visit. There were no vitals filed for this  visit.  Estimated body mass index is 22.54 kg/m as calculated from the following:   Height as of 02/06/23: 4\' 11"  (1.499 m).   Weight as of 02/06/23: 111 lb 9.6 oz (50.6 kg).  LABS: CBC:    Component Value Date/Time   WBC 24.6 (H) 02/03/2023 1053   WBC 18.1 (H) 08/25/2022 0928   HGB 10.1 (L) 02/03/2023 1053   HCT 30.7 (L) 02/03/2023 1053   PLT 170 02/03/2023 1053   MCV 102.0 (H) 02/03/2023 1053   NEUTROABS 9.0 (H) 02/03/2023 1053   LYMPHSABS 2.6 02/03/2023 1053   MONOABS 12.2 (H) 02/03/2023 1053   EOSABS 0.3 02/03/2023 1053   BASOSABS 0.2 (H) 02/03/2023 1053   Comprehensive Metabolic Panel:    Component Value Date/Time   NA 138 02/03/2023 1053   K 3.8 02/03/2023 1053   CL 103 02/03/2023 1053   CO2 26 02/03/2023 1053   BUN 40 (H) 02/03/2023 1053   CREATININE 1.18 (H) 02/03/2023 1053   GLUCOSE 85 02/03/2023 1053   CALCIUM 9.3 02/03/2023 1053   AST 22 02/03/2023 1053   ALT 13 02/03/2023 1053   ALKPHOS 97 02/03/2023 1053   BILITOT 0.4 02/03/2023 1053   PROT 7.4 02/03/2023 1053   ALBUMIN 4.0 02/03/2023 1053    RADIOGRAPHIC STUDIES: No results found.  PERFORMANCE STATUS (ECOG) : 2 - Symptomatic, <50% confined to bed  Review of Systems Unless otherwise noted, a complete review of systems is negative.  Physical Exam General: NAD Cardiovascular: regular rate and rhythm Pulmonary: clear ant fields Abdomen: soft, nontender, + bowel sounds GU: no suprapubic tenderness Extremities: no edema, no joint deformities Skin: Scattered erythematous rash ankles, legs, and abdomen Neurological: Weakness but otherwise nonfocal       IMPRESSION/PLAN: CMML -on surveillance.  Will send for CBC today  Rash -unclear etiology.  Does not appear to be a cutaneous leukemia.  Discussed with Dr. Donneta Romberg who also reviewed pictures. ?viral rash.  Will start patient on topical steroid and oral antihistamine and send dermatology referral.  Patient advised to also have follow-up with  PCP.  Weakness -family would like to obtain [employer] mobility scooter.  Case and plan discussed with Dr. Donneta Romberg  Patient expressed understanding and was in agreement with this plan. She also understands that She can call clinic at any time with any questions, concerns, or complaints.   Thank you for allowing me to participate in the care of this very pleasant patient.   Time Total: 20 minutes  Visit consisted of counseling and education dealing with the complex and emotionally intense issues of symptom management in the setting of serious illness.Greater than 50%  of this time was spent counseling and coordinating care related to the above assessment and plan.  Signed by: Laurette Schimke, PhD, NP-C

## 2023-04-01 ENCOUNTER — Telehealth: Payer: Self-pay | Admitting: *Deleted

## 2023-04-01 NOTE — Telephone Encounter (Signed)
Amanda Rose called asking to change the [employer] scooter to the [facility] wheelchair She states that she got soomething from [place] ? Prescription or CMN on 7/18 for the scooter and would like a new request for the W/C Pleae call her back when ready

## 2023-04-07 ENCOUNTER — Other Ambulatory Visit: Payer: Self-pay | Admitting: *Deleted

## 2023-04-07 ENCOUNTER — Encounter: Payer: Self-pay | Admitting: Internal Medicine

## 2023-04-07 ENCOUNTER — Encounter: Payer: Self-pay | Admitting: *Deleted

## 2023-04-07 DIAGNOSIS — R29898 Other symptoms and signs involving the musculoskeletal system: Secondary | ICD-10-CM

## 2023-04-07 NOTE — Telephone Encounter (Signed)
04/07/23-  Contacted daughter. She is aware that script of wheelchair is ready to be picked up at cancer center front desk. She will pick up script later this afternoon.

## 2023-04-09 ENCOUNTER — Telehealth: Payer: Self-pay

## 2023-04-09 NOTE — Telephone Encounter (Signed)
Courtney from [employer] called stating the script sent in for [facility] wheel chair will not be accepted because [employer] does not have any medical records on this patient. She states they will need a prior Authorization sent with medical record. She states a prior authorization can be done through phone [phone] or [email or web address]

## 2023-04-10 NOTE — Telephone Encounter (Signed)
I attempted to do a PA with [employer]; however, we had to register as a clinic prior to completing any PA. As soon as this processes, I can complete an online portal PA for the w/c

## 2023-04-13 ENCOUNTER — Encounter: Payer: Self-pay | Admitting: *Deleted

## 2023-04-13 NOTE — Telephone Encounter (Signed)
I was unable to submit any PA or records with [employer] portal. Per [employer]-claim is is already pending- just need medical records to process the claim. Claim # [id number]  Records can be faxed to [phone]

## 2023-04-14 NOTE — Telephone Encounter (Signed)
Records faxed on 04/13/23

## 2023-04-16 ENCOUNTER — Telehealth: Payer: Self-pay

## 2023-04-16 NOTE — Telephone Encounter (Signed)
Amanda Rose called stating they need a vendor for the order for the electric wheelchair order. Call back #[phone] ext [phone]

## 2023-04-17 NOTE — Telephone Encounter (Signed)
I spoke with [employer] on 04/13/23 re: Claim # [id number]- (Faxed to [phone]).  When I spoke with the representative at that time, the representative wanted the dme item number for the exact wheelchair that was being dispense. Since we are not the company dispensing this, I told the representative at that time that I could not provide the information. She told me that without this information, [facility]ia Pouch could not process the claim. I again reiterated that our clinic does not have this information. Monia Pouch stated that records were needed to still process the claim and I faxed these records per [employer]'s request.  We do not know where the patient's daughter took the prescription/preferred agency.  Josh gave the prescription to the patient's family and the family brought the prescription to their preferred dme equipment company.  I left a detailed vm for patient's daughter that most likely, [facility] will close the claim and will not approve the wheelchair. We are unable to provide this information to the insurance.

## 2023-04-29 ENCOUNTER — Telehealth: Payer: Self-pay | Admitting: *Deleted

## 2023-04-29 NOTE — Telephone Encounter (Signed)
I left a detailed vm- requesting daughter to return our phone call. Maralyn Sago would like to see patient in the office on Friday 8/23- am apt

## 2023-04-29 NOTE — Telephone Encounter (Signed)
Daughter called reporting that the steroid cream patient was given for itching is not working and that the itching keeps moving to other parts of her body. She states that this is not allergies or a change in soaps and is asking that we check her kidney functions as she has been researching on  line. She is asking what else this can be form. Please advise

## 2023-04-30 ENCOUNTER — Telehealth: Payer: Self-pay | Admitting: *Deleted

## 2023-04-30 NOTE — Telephone Encounter (Signed)
Called the daughter back and told her about the appointment on Friday, daughter wants to come in early to get labs because the patient is still having itching but you cannot see it is itching inside.  After I told the patient that she could come in and the Mitzie would only take no more than 30 minutes she decided that she wants to come in at 930 on Friday and then see the symptom management person and go from there.  The daughter did ask if that is the right thing to do and I told her that you might come in and the labs are within normal limits or you might see him with some problem so it is a hit or miss whether or not this might help but it is a step in to see if they can figure out for iitching

## 2023-05-01 ENCOUNTER — Encounter: Payer: Self-pay | Admitting: Medical Oncology

## 2023-05-01 ENCOUNTER — Inpatient Hospital Stay: Payer: Medicare HMO | Attending: Internal Medicine | Admitting: Medical Oncology

## 2023-05-01 ENCOUNTER — Inpatient Hospital Stay: Payer: Medicare HMO

## 2023-05-01 VITALS — BP 121/51 | HR 80 | Temp 98.7°F | Wt 110.5 lb

## 2023-05-01 DIAGNOSIS — R5383 Other fatigue: Secondary | ICD-10-CM | POA: Insufficient documentation

## 2023-05-01 DIAGNOSIS — D649 Anemia, unspecified: Secondary | ICD-10-CM

## 2023-05-01 DIAGNOSIS — Z79899 Other long term (current) drug therapy: Secondary | ICD-10-CM | POA: Insufficient documentation

## 2023-05-01 DIAGNOSIS — R7989 Other specified abnormal findings of blood chemistry: Secondary | ICD-10-CM | POA: Diagnosis not present

## 2023-05-01 DIAGNOSIS — E039 Hypothyroidism, unspecified: Secondary | ICD-10-CM | POA: Insufficient documentation

## 2023-05-01 DIAGNOSIS — C9310 Chronic myelomonocytic leukemia not having achieved remission: Secondary | ICD-10-CM | POA: Insufficient documentation

## 2023-05-01 DIAGNOSIS — R21 Rash and other nonspecific skin eruption: Secondary | ICD-10-CM | POA: Insufficient documentation

## 2023-05-01 DIAGNOSIS — Z79890 Hormone replacement therapy (postmenopausal): Secondary | ICD-10-CM | POA: Diagnosis not present

## 2023-05-01 DIAGNOSIS — L299 Pruritus, unspecified: Secondary | ICD-10-CM

## 2023-05-01 LAB — CMP (CANCER CENTER ONLY)
ALT: 12 U/L (ref 0–44)
AST: 20 U/L (ref 15–41)
Albumin: 4 g/dL (ref 3.5–5.0)
Alkaline Phosphatase: 90 U/L (ref 38–126)
Anion gap: 8 (ref 5–15)
BUN: 37 mg/dL — ABNORMAL HIGH (ref 8–23)
CO2: 23 mmol/L (ref 22–32)
Calcium: 8.8 mg/dL — ABNORMAL LOW (ref 8.9–10.3)
Chloride: 107 mmol/L (ref 98–111)
Creatinine: 1.6 mg/dL — ABNORMAL HIGH (ref 0.44–1.00)
GFR, Estimated: 31 mL/min — ABNORMAL LOW (ref >60)
Glucose, Bld: 180 mg/dL — ABNORMAL HIGH (ref 70–99)
Potassium: 3.5 mmol/L (ref 3.5–5.1)
Sodium: 138 mmol/L (ref 135–145)
Total Bilirubin: 0.5 mg/dL (ref 0.3–1.2)
Total Protein: 7.1 g/dL (ref 6.5–8.1)

## 2023-05-01 LAB — CBC WITH DIFFERENTIAL (CANCER CENTER ONLY)
Abs Immature Granulocytes: 0.61 10*3/uL — ABNORMAL HIGH (ref 0.00–0.07)
Basophils Absolute: 0.2 10*3/uL — ABNORMAL HIGH (ref 0.0–0.1)
Basophils Relative: 1 %
Eosinophils Absolute: 0.4 10*3/uL (ref 0.0–0.5)
Eosinophils Relative: 2 %
HCT: 28.3 % — ABNORMAL LOW (ref 36.0–46.0)
Hemoglobin: 9.2 g/dL — ABNORMAL LOW (ref 12.0–15.0)
Immature Granulocytes: 2 %
Lymphocytes Relative: 11 %
Lymphs Abs: 3.1 10*3/uL (ref 0.7–4.0)
MCH: 33.6 pg (ref 26.0–34.0)
MCHC: 32.5 g/dL (ref 30.0–36.0)
MCV: 103.3 fL — ABNORMAL HIGH (ref 80.0–100.0)
Monocytes Absolute: 12.4 10*3/uL — ABNORMAL HIGH (ref 0.1–1.0)
Monocytes Relative: 45 %
Neutro Abs: 10.7 10*3/uL — ABNORMAL HIGH (ref 1.7–7.7)
Neutrophils Relative %: 39 %
Platelet Count: 141 10*3/uL — ABNORMAL LOW (ref 150–400)
RBC: 2.74 MIL/uL — ABNORMAL LOW (ref 3.87–5.11)
RDW: 13.2 % (ref 11.5–15.5)
Smear Review: NORMAL
WBC Count: 27.3 10*3/uL — ABNORMAL HIGH (ref 4.0–10.5)
nRBC: 0 % (ref 0.0–0.2)

## 2023-05-01 LAB — LACTATE DEHYDROGENASE: LDH: 213 U/L — ABNORMAL HIGH (ref 98–192)

## 2023-05-01 MED ORDER — BUTENAFINE HCL 1 % EX CREA: 1.0000 | TOPICAL_CREAM | Freq: Two times a day (BID) | CUTANEOUS | 0 refills | Status: DC

## 2023-05-01 NOTE — Progress Notes (Signed)
Symptom Management Clinic [facility] at [facility] Telephone:([phone]) [phone] Fax:([phone]) [phone]  Patient Care Team: Marisue Ivan, [name], MD as Consulting Physician (Oncology)   NAME OF PATIENT: Amanda Rose  [id number]  1932-01-14   DATE OF VISIT: 05/01/23  REASON FOR CONSULT: Amanda Rose is a 89 y.o. 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   INTERVAL HISTORY: Patient presents [facility] today for evaluation of Foot itching:  Was seen previously for an itchy rash of stomach, foot, and face with unknown origin. This has resolved with steroid cream. Now she has an itchy rash of her feet. This does keep her up at night sometimes. Rash improves some with lotion. She denies any itchy contacts.   She denies sick contacts.  She does have animals in the home.  No changes in lotions or creams or medications.   She denies fever or chills.  Does have fatigue chronically. No muscle or joint pain. No GI or GU symptoms.  No easy bleeding or bruising.  Reports good appetite and denies weight loss.  No chest pain.  Patient offers no further specific complaints today.  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)    Hypothyroidism    Lower extremity weakness    Pre-diabetes    Thyroid disease    Weakness of both legs     PAST SURGICAL HISTORY:  Past Surgical History:  Procedure Laterality Date   IR KYPHO EA ADDL LEVEL THORACIC OR LUMBAR  12/17/2021   IR KYPHO THORACIC WITH BONE BIOPSY  12/17/2021   IR RADIOLOGIST EVAL & MGMT  12/09/2021   KYPHOPLASTY N/A 04/05/2021   Procedure: T10 and L2 KYPHOPLASTY;  [name], MD;  [name];  Service: Orthopedics;  [name];    HEMATOLOGY/ONCOLOGY HISTORY:  Oncology History Overview Note  April 2023- BONE MARROW, ASPIRATE, CLOT,  CORE:  -  Hypercellular marrow with myeloid hyperplasia and monocytosis  -  See comment and microscopic description below   PERIPHERAL BLOOD:  -  Macrocytic anemia and monocytosis  -  See complete blood cell count   COMMENT:   The findings in the marrow are 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  MEDICATIONS:  Current Outpatient Medications  Medication Sig Dispense Refill   cetirizine (ZYRTEC) 10 MG tablet Take 10 mg by mouth in the morning.     Cholecalciferol (VITAMIN D-3) 125 MCG (5000 UT) TABS Take 5,[name],000 mcg by mouth in the morning.     ibuprofen (ADVIL) 200 MG tablet Take 200-400 mg by mouth every 4 (four) hours as needed for mild pain.     levothyroxine (SYNTHROID) 100 MCG tablet Take 100 mcg by mouth daily before breakfast.     Magnesium 100 MG CAPS Take by mouth.     Multiple Vitamin (MULTIVITAMIN WITH MINERALS) TABS tablet Take 1 tablet by mouth in the morning. Equate Complete Multivitamin     Multiple Vitamins-Minerals (ALGAE BASED CALCIUM) TABS Take 2 tablets by mouth in the morning and at  bedtime. AlgaeCal Plus Plant-Based Calcium Supplement with Magnesium Boron Vitamin K2 + D3     Multiple Vitamins-Minerals (BONE SMART PO) Take 2 tablets by mouth daily in the afternoon. Strontium Boost Natural Strontium Citrate Supplement     triamcinolone ointment (KENALOG) 0.5 % Apply 1 Application topically 2 (two) times daily. 30 g 0   diphenhydrAMINE (SOMINEX) 25 MG tablet Take 25 mg by mouth at bedtime as needed for sleep. Sleep Aid     traMADol (ULTRAM) 50 MG tablet Take 50 mg by mouth every 6  (six) hours as needed for moderate pain.     No current facility-administered medications for this visit.    VITAL SIGNS: BP (!) 121/51 (BP Location: Left Arm, Patient Position: Sitting)   Pulse 80   Temp 98.7 F (37.1 C) (Tympanic)   Wt 110 lb 8 oz (50.1 kg)   SpO2 94%   BMI 22.32 kg/m  Filed Weights   05/01/23 0941  Weight: 110 lb 8 oz (50.1 kg)    Estimated body mass index is 22.32 kg/m as calculated from the following:   Height as of 02/06/23: 4\' 11"  (1.499 m).   Weight as of this encounter: 110 lb 8 oz (50.1 kg).  LABS: CBC:    Component Value Date/Time   WBC 27.3 (H) 05/01/2023 0927   WBC 30.5 (H) 03/26/2023 1442   HGB 9.2 (L) 05/01/2023 0927   HCT 28.3 (L) 05/01/2023 0927   PLT 141 (L) 05/01/2023 0927   MCV 103.3 (H) 05/01/2023 0927   NEUTROABS PENDING 05/01/2023 0927   LYMPHSABS PENDING 05/01/2023 0927   MONOABS PENDING 05/01/2023 0927   EOSABS PENDING 05/01/2023 0927   BASOSABS PENDING 05/01/2023 0927   Comprehensive Metabolic Panel:    Component Value Date/Time   NA 138 02/03/2023 1053   K 3.8 02/03/2023 1053   CL 103 02/03/2023 1053   CO2 26 02/03/2023 1053   BUN 40 (H) 02/03/2023 1053   CREATININE 1.18 (H) 02/03/2023 1053   GLUCOSE 85 02/03/2023 1053   CALCIUM 9.3 02/03/2023 1053   AST 22 02/03/2023 1053   ALT 13 02/03/2023 1053   ALKPHOS 97 02/03/2023 1053   BILITOT 0.4 02/03/2023 1053   PROT 7.4 02/03/2023 1053   ALBUMIN 4.0 02/03/2023 1053    PERFORMANCE STATUS (ECOG) : 2 - Symptomatic, <50% confined to bed  Review of Systems Unless otherwise noted, a complete review of systems is negative.  Physical Exam General: NAD Cardiovascular: regular rate and rhythm Pulmonary: clear ant fields Extremities: no edema, no joint deformities Skin: See below Neurological: Weakness but otherwise nonfocal     IMPRESSION/PLAN: CMML -on surveillance.  CBC slightly improved from last month.   Rash - Resolved.   Itchy Feet: Suspect possible  tinea pedis. Trial of Lotrimin. Discussed how to use along with fall precautions. Low likelihood of scabies given lack of itchy contacts and lack of rash.   Elevated creatinine- New. Encouraged hydration with water, reduction of soda, avoidance of NSAIDs, avoidance of vitamin C supplementation. Referral to nephrology sent.   Patient expressed understanding and was in agreement with this plan. She also understands that She can call clinic at any time with any questions, concerns, or complaints.   Thank you for allowing me to participate in the care of this very pleasant patient.   Time Total: 20 minutes  Visit consisted of counseling and education dealing with the complex and emotionally intense issues of symptom management in the setting of serious illness.Greater than 50%  of this time was spent counseling and coordinating care related to the above assessment and plan.  Signed by: Rushie Chestnut PA-C

## 2023-06-01 ENCOUNTER — Inpatient Hospital Stay: Payer: Medicare HMO | Attending: Internal Medicine

## 2023-06-01 DIAGNOSIS — N183 Chronic kidney disease, stage 3 unspecified: Secondary | ICD-10-CM | POA: Insufficient documentation

## 2023-06-01 DIAGNOSIS — D649 Anemia, unspecified: Secondary | ICD-10-CM | POA: Insufficient documentation

## 2023-06-01 DIAGNOSIS — Z79899 Other long term (current) drug therapy: Secondary | ICD-10-CM | POA: Insufficient documentation

## 2023-06-01 DIAGNOSIS — C9310 Chronic myelomonocytic leukemia not having achieved remission: Secondary | ICD-10-CM | POA: Insufficient documentation

## 2023-06-01 LAB — CBC WITH DIFFERENTIAL (CANCER CENTER ONLY)
Abs Immature Granulocytes: 0.93 10*3/uL — ABNORMAL HIGH (ref 0.00–0.07)
Basophils Absolute: 0.3 10*3/uL — ABNORMAL HIGH (ref 0.0–0.1)
Basophils Relative: 1 %
Eosinophils Absolute: 0.4 10*3/uL (ref 0.0–0.5)
Eosinophils Relative: 1 %
HCT: 28.6 % — ABNORMAL LOW (ref 36.0–46.0)
Hemoglobin: 9.4 g/dL — ABNORMAL LOW (ref 12.0–15.0)
Immature Granulocytes: 3 %
Lymphocytes Relative: 9 %
Lymphs Abs: 3.5 10*3/uL (ref 0.7–4.0)
MCH: 33.3 pg (ref 26.0–34.0)
MCHC: 32.9 g/dL (ref 30.0–36.0)
MCV: 101.4 fL — ABNORMAL HIGH (ref 80.0–100.0)
Monocytes Absolute: 21.1 10*3/uL — ABNORMAL HIGH (ref 0.1–1.0)
Monocytes Relative: 56 %
Neutro Abs: 11.5 10*3/uL — ABNORMAL HIGH (ref 1.7–7.7)
Neutrophils Relative %: 30 %
Platelet Count: 150 10*3/uL (ref 150–400)
RBC: 2.82 MIL/uL — ABNORMAL LOW (ref 3.87–5.11)
RDW: 13.1 % (ref 11.5–15.5)
Smear Review: NORMAL
WBC Count: 37.6 10*3/uL — ABNORMAL HIGH (ref 4.0–10.5)
nRBC: 0 % (ref 0.0–0.2)

## 2023-06-01 LAB — IRON AND TIBC
Iron: 95 ug/dL (ref 28–170)
Saturation Ratios: 26 % (ref 10.4–31.8)
TIBC: 370 ug/dL (ref 250–450)
UIBC: 275 ug/dL

## 2023-06-01 LAB — LACTATE DEHYDROGENASE: LDH: 211 U/L — ABNORMAL HIGH (ref 98–192)

## 2023-06-01 LAB — FERRITIN: Ferritin: 130 ng/mL (ref 11–307)

## 2023-06-08 ENCOUNTER — Inpatient Hospital Stay (HOSPITAL_BASED_OUTPATIENT_CLINIC_OR_DEPARTMENT_OTHER): Payer: Medicare HMO | Admitting: Internal Medicine

## 2023-06-08 ENCOUNTER — Encounter: Payer: Self-pay | Admitting: Internal Medicine

## 2023-06-08 VITALS — BP 130/64 | HR 98 | Temp 98.0°F | Ht 59.0 in | Wt 114.6 lb

## 2023-06-08 DIAGNOSIS — C9310 Chronic myelomonocytic leukemia not having achieved remission: Secondary | ICD-10-CM | POA: Diagnosis not present

## 2023-06-08 NOTE — Progress Notes (Signed)
C/o waking up too early in the morning. Has been waking up at 6:30am, but here lately has been 3:30. She does take an otc sleep aid.  No appetite, makes herself eat, boost 1 every other day.  Constipation, prune juice.  C/o Energy level not the same as 4 months ago.  Pt states she feels sad about all the things she can't do anymore.

## 2023-06-08 NOTE — Progress Notes (Signed)
[facility] OFFICE PROGRESS NOTE  Patient Care Team: Marisue Ivan, [name], MD as Consulting Physician (Oncology)   # HEMATOLOGY HISTORY:  # LEUCOCYTOSIS- [FEB 2023- PCP] WBC 35 [predominant monocytosis; mild lymphocytosis; moderate neutrophilia]; [name]; platelets- 180; [name], white count elevated.     RBC (Red Blood Cell Count) 4.04 - 5.48 10^6/uL 3.02 Low     Hemoglobin 12.0 - 15.0 gm/dL 81.1 Low     Hematocrit 35.0 - 47.0 % 31.9 Low     MCV (Mean Corpuscular Volume) 80.0 - 100.0 fl 105.6 High     MCH (Mean Corpuscular Hemoglobin) 27.0 - 31.2 pg 34.8 High     MCHC (Mean Corpuscular Hemoglobin Concentration) 32.0 - 36.0 gm/dL 91.4    Platelet Count 150 - 450 10^3/uL 180    RDW-CV (Red Cell Distribution Width) 11.6 - 14.8 % 14.5    MPV (Mean Platelet Volume) 9.4 - 12.4 fl 9.1 Low     Neutrophils 1.50 - 7.80 10^3/uL 16.28 High     Lymphocytes 1.00 - 3.60 10^3/uL 4.61 High     Monocytes 0.00 - 1.50 10^3/uL 16.92 High     Eosinophils 0.00 - 0.55 10^3/uL 0.48    Basophils 0.00 - 0.09 10^3/uL 0.07    Neutrophil % 32.0 - 70.0 % 41.2    Lymphocyte % 10.0 - 50.0 % 11.7    Monocyte % 4.0 - 13.0 % 42.8 High   See manual diff and path review.   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   Accompanied by daughter  Amanda Rose 89 y.o.  female pleasant chronic myelomonocytic leukemia  currently on surveillance is here for follow-up.  C/o waking up too early in the morning. Has been waking up at 6:30am, but here lately has been 3:30. She does take an otc sleep aid. No appetite, makes herself eat, boost 1 every other day.   Constipation, prune juice. C/o Energy level not the same as 4 months ago.   Pt states she feels sad about all the things she can't do anymore.Denies any blood in stools or blood in the stools.  No nausea or vomiting.  Review of Systems  Constitutional:  Positive for malaise/fatigue. Negative for chills, [name], palpitations, orthopnea and leg  swelling.  Gastrointestinal:  Negative for abdominal pain, blood in stool, constipation, diarrhea, heartburn, melena, nausea and vomiting.  Genitourinary:  Negative for dysuria, frequency and urgency.  Musculoskeletal:  Negative for joint pain.  Skin: Negative.  Negative for itching and rash.  Neurological:  Negative for dizziness, tingling, focal weakness, weakness and headaches.  Endo/Heme/Allergies:  Does not bruise/bleed easily.  Psychiatric/Behavioral:  Negative for depression. The patient is not nervous/anxious and does not have insomnia.       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)     Hypothyroidism    Lower extremity weakness    Pre-diabetes    Thyroid disease    Weakness of both legs     PAST SURGICAL HISTORY :   Past Surgical History:  Procedure Laterality Date   IR KYPHO EA ADDL LEVEL THORACIC OR LUMBAR  12/17/2021   IR KYPHO THORACIC WITH BONE BIOPSY  12/17/2021   IR RADIOLOGIST EVAL & MGMT  12/09/2021   KYPHOPLASTY N/A 04/05/2021   Procedure: T10 and L2 KYPHOPLASTY;  [name], MD;  [name];  Service: Orthopedics;  [name];    FAMILY HISTORY :   Family History  Problem Relation Age of Onset   Cancer Father        unknown    SOCIAL HISTORY:   Social History   Tobacco Use   Smoking status: Never   Smokeless tobacco: Never  Vaping Use   Vaping status: Never Used  Substance Use Topics   Alcohol use: Never   Drug use: Never    ALLERGIES:  is allergic to other.  MEDICATIONS:  Current Outpatient Medications  Medication Sig Dispense Refill   Butenafine HCl (LOTRIMIN ULTRA) 1 % cream Apply 1 Application topically 2 (two) times daily. 24 g 0   cetirizine (ZYRTEC) 10 MG tablet Take 10 mg by mouth in the morning.     Cholecalciferol (VITAMIN D-3) 125 MCG (5000 UT) TABS Take 5,[name],000 mcg by mouth in the morning.     diphenhydrAMINE (SOMINEX) 25 MG tablet Take 25 mg by mouth at bedtime as needed for sleep. Sleep Aid     ibuprofen (ADVIL) 200 MG tablet Take 200-400 mg by mouth every 4 (four) hours as needed for mild pain.     levothyroxine (SYNTHROID) 100 MCG tablet Take 100 mcg by mouth daily before breakfast.     Magnesium 100 MG CAPS Take by mouth.     Multiple Vitamin (MULTIVITAMIN WITH MINERALS) TABS tablet Take 1 tablet by mouth in the morning. Equate Complete Multivitamin     Multiple Vitamins-Minerals (ALGAE BASED CALCIUM) TABS Take 2 tablets by mouth in the morning and at bedtime. AlgaeCal Plus Plant-Based Calcium Supplement with  Magnesium Boron Vitamin K2 + D3     Multiple Vitamins-Minerals (BONE SMART PO) Take 2 tablets by mouth daily in the afternoon. Strontium Boost Natural Strontium Citrate Supplement     traMADol (ULTRAM) 50 MG tablet Take 50 mg by mouth every 6 (six) hours as needed for moderate pain.     triamcinolone ointment (KENALOG) 0.5 % Apply 1 Application topically 2 (two) times daily. 30 g 0   No current facility-administered medications for this visit.    PHYSICAL EXAMINATION:  BP 130/64 (BP Location: Left Arm, Patient Position: Sitting, Cuff Size: Normal)   Pulse  98   Temp 98 F (36.7 C) (Tympanic)   Ht 4\' 11"  (1.499 m)   Wt 114 lb 9.6 oz (52 kg)   SpO2 97%   BMI 23.15 kg/m   Filed Weights   06/08/23 1453  Weight: 114 lb 9.6 oz (52 kg)     Physical Exam Vitals and nursing note reviewed.  HENT:     Head: Normocephalic and atraumatic.     Mouth/Throat:     Pharynx: Oropharynx is clear.  Eyes:     Extraocular Movements: Extraocular movements intact.     Pupils: Pupils are equal, round, and reactive to light.  Cardiovascular:     Rate and Rhythm: Normal rate and regular rhythm.  Pulmonary:     Comments: Decreased breath sounds bilaterally.  Abdominal:     Palpations: Abdomen is soft.  Musculoskeletal:        General: Normal range of motion.     Cervical back: Normal range of motion.  Skin:    General: Skin is warm.  Neurological:     General: No focal deficit present.     Mental Status: She is alert and oriented to person, place, and time.  Psychiatric:        Behavior: Behavior normal.        Judgment: Judgment normal.        LABORATORY DATA:  I have reviewed the data as listed    Component Value Date/Time   NA 138 05/01/2023 0927   K 3.5 05/01/2023 0927   CL 107 05/01/2023 0927   CO2 23 05/01/2023 0927   GLUCOSE 180 (H) 05/01/2023 0927   BUN 37 (H) 05/01/2023 0927   CREATININE 1.60 (H) 05/01/2023 0927   CALCIUM 8.8 (L) 05/01/2023 0927   PROT 7.1 05/01/2023  0927   ALBUMIN 4.0 05/01/2023 0927   AST 20 05/01/2023 0927   ALT 12 05/01/2023 0927   ALKPHOS 90 05/01/2023 0927   BILITOT 0.5 05/01/2023 0927   GFRNONAA 31 (L) 05/01/2023 0927   GFRAA >60 12/03/2017 1003    No results found for: "SPEP", "UPEP"  Lab Results  Component Value Date   WBC 37.6 (H) 06/01/2023   NEUTROABS 11.5 (H) 06/01/2023   HGB 9.4 (L) 06/01/2023   HCT 28.6 (L) 06/01/2023   MCV 101.4 (H) 06/01/2023   PLT 150 06/01/2023      Chemistry      Component Value Date/Time   NA 138 05/01/2023 0927   K 3.5 05/01/2023 0927   CL 107 05/01/2023 0927   CO2 23 05/01/2023 0927   BUN 37 (H) 05/01/2023 0927   CREATININE 1.60 (H) 05/01/2023 0927      Component Value Date/Time   CALCIUM 8.8 (L) 05/01/2023 0927   ALKPHOS 90 05/01/2023 0927   AST 20 05/01/2023 0927   ALT 12 05/01/2023 0927   BILITOT 0.5 05/01/2023 0927       RADIOGRAPHIC STUDIES: I have personally reviewed the radiological images as listed and agreed with the findings in the report. No results found.   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  NEAGTIVE for JAK2 mutation; MPL; CALR mutation; PDGRA-NEGATIVE.   # Reviewed that May 2024-white count rising 32; stable at 9.4 platelets normal- Patient continues to be asymptomatic.  Discussed the potential signs and symptoms of  progressive leukemia-include weight loss night sweats increase risk of bleeding; worsening fatigue etc.  Discussed the treatment options for leukemia include Hydrea/decitabine.  However as patient declines at this time.   # Mild Anemia-hemoglobin  9.4  likely secondary above. MAY 2024- iron sat-29 Ferritin- 139.  Hold off Retacrit/ possible  venofer Discussed use of erythropoietin stimulating agents like retacrit to stimulate the bone marrow.  Discussed the potential issues with erythropoietin estimating agents-given the risk of stroke thromboembolic events/elevated blood pressure.  However, most of the serious events did not happen when the goal hematocrit is 33/hemoglobin 30.  I also discussed the use of concurrent IV iron infusions.   # fatigue: Sec to CMML/anemia-continue ginseng; consider Anti-depressant [PCP]; consider exercise.   # CKD stage III [43]- [place]- APRIl 2023- NO hydronephrosis.   # weight loss: Discussed regarding protein intake/muscle health.  # refer to nutrition/Joli re: weight loss  # Palliative care evaluation: S/p evaluation with Josh Borders.    # DISPOSITION: # follow up in 2  months;labs- cbc/cmp; LDH;Erythropoietin levels;  possible venofer or Retacrit- - -Dr.B    Orders Placed This Encounter  Procedures   CMP (Cancer Center only)    Standing Status:   Future    Standing Expiration Date:   06/07/2024   CBC with Differential (Cancer Center Only)    Standing Status:   Future    Standing Expiration Date:   06/07/2024   Lactate dehydrogenase    Standing Status:   Future    Standing Expiration Date:   06/07/2024   Erythropoietin    Standing Status:   Future    Standing Expiration Date:   06/07/2024   All questions were answered. The patient knows to call the clinic with any problems, questions or concerns.      Earna Coder, MD 06/08/2023 3:55 PM

## 2023-06-08 NOTE — Assessment & Plan Note (Addendum)
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  NEAGTIVE for JAK2 mutation; MPL; CALR mutation; PDGRA-NEGATIVE.   # Reviewed that May 2024-white count rising 32; stable at 9.4 platelets normal- Patient continues to be asymptomatic.  Discussed the potential signs and symptoms of progressive leukemia-include weight loss night sweats increase risk of bleeding; worsening fatigue etc.  Discussed the treatment options for leukemia include Hydrea/decitabine.  However as patient declines at this time.   # Mild Anemia-hemoglobin  9.4  likely secondary above. MAY 2024- iron sat-29 Ferritin- 139.  Hold off Retacrit/ possible venofer Discussed use of erythropoietin stimulating agents like retacrit to stimulate the bone marrow.  Discussed the potential issues with erythropoietin estimating agents-given the risk of stroke thromboembolic events/elevated blood pressure.  However, most of the serious events did not happen when the goal hematocrit is 33/hemoglobin 30.  I also discussed the use of concurrent IV iron infusions.   # fatigue: Sec to CMML/anemia-continue ginseng; consider Anti-depressant [PCP]; consider exercise.   # CKD stage III [43]- [place]- APRIl 2023- NO hydronephrosis.   # weight loss: Discussed regarding protein intake/muscle health.  # refer to nutrition/Joli re: weight loss  # Palliative care evaluation: S/p evaluation with Josh Borders.    # DISPOSITION: # follow up in 2  months;labs- cbc/cmp; LDH;Erythropoietin levels;  possible venofer or Retacrit- - -Dr.B

## 2023-06-09 ENCOUNTER — Other Ambulatory Visit: Payer: Self-pay | Admitting: Internal Medicine

## 2023-06-10 ENCOUNTER — Ambulatory Visit: Payer: Medicare HMO | Admitting: Internal Medicine

## 2023-07-22 ENCOUNTER — Telehealth: Payer: Self-pay | Admitting: Internal Medicine

## 2023-07-22 ENCOUNTER — Telehealth: Payer: Self-pay | Admitting: *Deleted

## 2023-07-22 ENCOUNTER — Telehealth: Payer: Self-pay

## 2023-07-22 NOTE — Telephone Encounter (Signed)
I called and spoke to daughter who stated patient is bedridden no relief, extreme pain unrelieved by Tramadol. Uncontrolled, difficulty walking, and dyspnea with exertion. I  informed her Dr. B is recommending her to go to ER. I advised her to request EMS assist with transporting since she is having issues with walking. Daughter agreed and verbalizes understanding

## 2023-07-22 NOTE — Telephone Encounter (Signed)
This patients daughter called- she states she had labs from primary care doctor and her numbers have risen to 57.3 (white blood count) 30ish % kidney function. She is requesting appointment please advise.

## 2023-07-22 NOTE — Telephone Encounter (Signed)
Per Dr B, patient needs to be seen in Symptom Management Clinic

## 2023-07-22 NOTE — Telephone Encounter (Signed)
Per Dr B, patient needs to see Symptom Management Clinic

## 2023-07-22 NOTE — Telephone Encounter (Signed)
Daughter called ereporting that patient has been bedridden for the past week with muscle spasms and when she went today to PCP her labs are off and she was advised to contact our office to see what needs to be done   (ABNORMAL) CBC w/auto Differential (5 Part) (07/22/2023 12:45 PM EST) Lab Results - (ABNORMAL) CBC w/auto Differential (5 Part) (07/22/2023 12:45 PM EST) Component Value Ref Range Test Method Analysis Time Performed At Pathologist Signature  WBC (White Blood Cell Count) 57.3 (H) 4.1 - 10.2 10^3/uL   07/22/2023 1:50 PM EST [facility]    Comment: Smear review agrees with analyzer results  RBC (Red Blood Cell Count) 2.89 (L) 4.04 - 5.48 10^6/uL   07/22/2023 1:50 PM EST [facility]    Hemoglobin 9.6 (L) 12.0 - 15.0 gm/dL   29/56/2130 8:65 PM EST [facility]    Hematocrit 28.2 (L) 35.0 - 47.0 %   07/22/2023 1:50 PM EST [facility]    MCV (Mean Corpuscular Volume) 97.6 80.0 - 100.0 fl   07/22/2023 1:50 PM EST [facility]    MCH (Mean Corpuscular Hemoglobin) 33.2 (H) 27.0 - 31.2 pg   07/22/2023 1:50 PM EST [facility]    MCHC (Mean Corpuscular Hemoglobin Concentration) 34.0 32.0 - 36.0 gm/dL   78/46/9629 5:28 PM EST [facility]    Platelet Count 175 150 - 450 10^3/uL   07/22/2023 1:50 PM EST [facility]    RDW-CV (Red Cell Distribution Width) 12.9 11.6 - 14.8 %   07/22/2023 1:50 PM EST [facility]    MPV (Mean Platelet Volume) 10.1 9.4 - 12.4 fl   07/22/2023 1:50 PM EST [facility]    Neutrophils 21.90 (H) 1.50 - 7.80 10^3/uL   07/22/2023 1:50 PM EST [facility]    Lymphocytes 6.39 (H) 1.00 - 3.60 10^3/uL   07/22/2023 1:50 PM EST [facility]    Monocytes 26.71 (H) 0.00 - 1.50 10^3/uL   07/22/2023 1:50 PM EST [facility]    Eosinophils 0.53 0.00 - 0.55 10^3/uL   07/22/2023 1:50 PM EST [facility]    Basophils 0.20 (H) 0.00 - 0.09 10^3/uL   07/22/2023 1:50 PM EST [facility]    Neutrophil % 38.3 32.0 - 70.0 %   07/22/2023 1:50 PM EST [facility]    Lymphocyte % 11.1 10.0 - 50.0 %   07/22/2023 1:50 PM EST [facility]    Monocyte % 46.6 (H) 4.0 - 13.0 %   07/22/2023 1:50 PM EST [facility]    Comment: Smear review agrees with analyzer results  Eosinophil % 0.9 (L) 1.0 - 5.0 %   07/22/2023 1:50 PM EST [facility]    Basophil% 0.3 0.0 - 2.0 %   07/22/2023 1:50 PM EST [facility]    Immature Granulocyte % 2.8 (H) <=0.7 %   07/22/2023 1:50 PM EST [facility]    Immature Granulocyte Count 1.60 (H) <=0.06 10^3/L   07/22/2023 1:50 PM EST [facility]    Comprehensive Metabolic Panel (CMP) Order: [id number] Component Ref Range & Units 12:45  Glucose 70 - 110 mg/dL 272  Sodium  136 - 145 mmol/L 134 Low   Potassium 3.6 - 5.1 mmol/L 4.1  Chloride 97 - 109 mmol/L 99  Carbon Dioxide (CO2) 22.0 - 32.0 mmol/L 26.8  Urea Nitrogen (BUN) 7 - 25 mg/dL 30 High   Creatinine 0.6 - 1.1 mg/dL 1.4 High   Glomerular Filtration Rate (eGFR) >60 mL/min/1.73sq m 36 Low   Comment: CKD-EPI (2021) does not include patient's race in the calculation of eGFR.  Monitoring changes of plasma creatinine and eGFR over time is useful for monitoring kidney function.  Interpretive Ranges for eGFR (CKD-EPI 2021):  eGFR:       >60 mL/min/1.73 sq. m - Normal eGFR:       30-59 mL/min/1.73 sq. m - Moderately Decreased eGFR:       15-29 mL/min/1.73 sq. m  - Severely Decreased eGFR:       < 15 mL/min/1.73 sq. m  - Kidney Failure   Note: These eGFR calculations do not apply in acute situations when eGFR is changing rapidly or patients on dialysis.  Calcium 8.7 - 10.3 mg/dL 9.5  AST 8 - 39 U/L 18  ALT 5 - 38 U/L 9  Alk Phos (alkaline Phosphatase) 34 - 104 U/L 90  Albumin 3.5 - 4.8 g/dL  4.2  Bilirubin, Total 0.3 - 1.2 mg/dL 0.5  Protein, Total 6.1 - 7.9 g/dL 7.6  A/G Ratio 1.0 - 5.0 gm/dL 1.2  Resulting Agency [facility]   Specimen Collected: 07/22/23 12:45   Performed by: [facility] Last Resulted: 07/22/23 13:19  Received From: [facility]  Result Received: 07/22/23 14:22

## 2023-07-23 ENCOUNTER — Ambulatory Visit
Admission: RE | Admit: 2023-07-23 | Discharge: 2023-07-23 | Disposition: A | Discharge status: Home / Self Care | Payer: Medicare HMO | Source: Ambulatory Visit | Attending: Physician Assistant | Admitting: Physician Assistant

## 2023-07-23 ENCOUNTER — Other Ambulatory Visit: Payer: Self-pay | Admitting: Physician Assistant

## 2023-07-23 ENCOUNTER — Encounter: Payer: Self-pay | Admitting: Internal Medicine

## 2023-07-23 ENCOUNTER — Telehealth: Payer: Self-pay | Admitting: *Deleted

## 2023-07-23 DIAGNOSIS — R109 Unspecified abdominal pain: Secondary | ICD-10-CM

## 2023-07-23 DIAGNOSIS — M545 Low back pain, unspecified: Secondary | ICD-10-CM

## 2023-07-23 DIAGNOSIS — C9310 Chronic myelomonocytic leukemia not having achieved remission: Secondary | ICD-10-CM

## 2023-07-23 NOTE — Telephone Encounter (Signed)
RN spoke with daughter. She feels that her mom is declining. She has been bedridden for 8 days. She saw pcp last Friday and managed to get over to the see pcp yesterday. Pt having lots of back pain and pt was given cortisone injection yesterday and this did not help. Pcp just prescribed oxycodone and zofran this morning. Daughter has given pt first dose this morning. Per daughter, pt is laborious in breathing. Pt doesn't want to go to the ER and would rather to remain comfortable at her home. Daughter stated that pcp is optimistic that pt is not in renal failure. Daughter "still concerned that moms kidney's are shutting down." Pt experiencing nausea, belching, she said pt remains hot and severely fatigued. She would like to discuss goals of care with provider. She would like to have her mom under hospice care and would like a hospice consult If possible. The pt does not want treatment for her leukemia. Pcp wanted to do imaging to r/o any nerve involvement to call pt's back spasms. However, daughter does not think she can even get pt to the these apts. She would like to just focus on comfort and quality care rather than bringing her mother to many apts. She does not have a preference on hospice agencies.  Josh, NP will call patient's family to further discuss.

## 2023-07-23 NOTE — Telephone Encounter (Signed)
I also spoke with daughter. She confirmed that patient has rapidly declined over the past week. Daughter feels patient is at end of life. She is not interested in workup or treatment at this point and instead would like to keep patient at home and comfortable. Daughter is interested in hospice involvement. Referral sent to hospice.

## 2023-07-23 NOTE — Telephone Encounter (Signed)
Daughter Amil Amen called asking to speak with Elouise Munroe, NP regarding when to call in hospice and what the procedure is to do that. I asked if they went to ER as advised last evening and she stated that they chose not to go. Please return her call [phone]

## 2023-07-24 ENCOUNTER — Inpatient Hospital Stay: Payer: Medicare HMO | Admitting: Nurse Practitioner

## 2023-08-12 ENCOUNTER — Inpatient Hospital Stay (HOSPITAL_BASED_OUTPATIENT_CLINIC_OR_DEPARTMENT_OTHER): Payer: Medicare HMO | Admitting: Internal Medicine

## 2023-08-12 ENCOUNTER — Telehealth: Payer: Self-pay | Admitting: *Deleted

## 2023-08-12 ENCOUNTER — Inpatient Hospital Stay: Payer: Medicare HMO | Attending: Internal Medicine

## 2023-08-12 ENCOUNTER — Encounter: Payer: Self-pay | Admitting: Internal Medicine

## 2023-08-12 ENCOUNTER — Inpatient Hospital Stay: Payer: Medicare HMO

## 2023-08-12 VITALS — BP 123/59 | HR 85 | Temp 97.5°F | Ht 59.0 in | Wt 110.6 lb

## 2023-08-12 DIAGNOSIS — C9310 Chronic myelomonocytic leukemia not having achieved remission: Secondary | ICD-10-CM | POA: Diagnosis present

## 2023-08-12 DIAGNOSIS — Z79899 Other long term (current) drug therapy: Secondary | ICD-10-CM | POA: Insufficient documentation

## 2023-08-12 DIAGNOSIS — F32A Depression, unspecified: Secondary | ICD-10-CM | POA: Diagnosis not present

## 2023-08-12 DIAGNOSIS — K219 Gastro-esophageal reflux disease without esophagitis: Secondary | ICD-10-CM | POA: Diagnosis not present

## 2023-08-12 DIAGNOSIS — N183 Chronic kidney disease, stage 3 unspecified: Secondary | ICD-10-CM | POA: Diagnosis not present

## 2023-08-12 LAB — CMP (CANCER CENTER ONLY)
ALT: 12 U/L (ref 0–44)
AST: 18 U/L (ref 15–41)
Albumin: 4 g/dL (ref 3.5–5.0)
Alkaline Phosphatase: 66 U/L (ref 38–126)
Anion gap: 11 (ref 5–15)
BUN: 33 mg/dL — ABNORMAL HIGH (ref 8–23)
CO2: 23 mmol/L (ref 22–32)
Calcium: 8.9 mg/dL (ref 8.9–10.3)
Chloride: 102 mmol/L (ref 98–111)
Creatinine: 1.62 mg/dL — ABNORMAL HIGH (ref 0.44–1.00)
GFR, Estimated: 30 mL/min — ABNORMAL LOW (ref >60)
Glucose, Bld: 143 mg/dL — ABNORMAL HIGH (ref 70–99)
Potassium: 3.4 mmol/L — ABNORMAL LOW (ref 3.5–5.1)
Sodium: 136 mmol/L (ref 135–145)
Total Bilirubin: 0.2 mg/dL (ref <1.2)
Total Protein: 7.2 g/dL (ref 6.5–8.1)

## 2023-08-12 LAB — CBC WITH DIFFERENTIAL (CANCER CENTER ONLY)
Abs Immature Granulocytes: 2.41 10*3/uL — ABNORMAL HIGH (ref 0.00–0.07)
Basophils Absolute: 0.5 10*3/uL — ABNORMAL HIGH (ref 0.0–0.1)
Basophils Relative: 1 %
Eosinophils Absolute: 0.6 10*3/uL — ABNORMAL HIGH (ref 0.0–0.5)
Eosinophils Relative: 1 %
HCT: 27.1 % — ABNORMAL LOW (ref 36.0–46.0)
Hemoglobin: 8.9 g/dL — ABNORMAL LOW (ref 12.0–15.0)
Immature Granulocytes: 4 %
Lymphocytes Relative: 8 %
Lymphs Abs: 4.9 10*3/uL — ABNORMAL HIGH (ref 0.7–4.0)
MCH: 33.3 pg (ref 26.0–34.0)
MCHC: 32.8 g/dL (ref 30.0–36.0)
MCV: 101.5 fL — ABNORMAL HIGH (ref 80.0–100.0)
Monocytes Absolute: 29.9 10*3/uL — ABNORMAL HIGH (ref 0.1–1.0)
Monocytes Relative: 48 %
Neutro Abs: 23.3 10*3/uL — ABNORMAL HIGH (ref 1.7–7.7)
Neutrophils Relative %: 38 %
Platelet Count: 127 10*3/uL — ABNORMAL LOW (ref 150–400)
RBC: 2.67 MIL/uL — ABNORMAL LOW (ref 3.87–5.11)
RDW: 13.5 % (ref 11.5–15.5)
Smear Review: NORMAL
WBC Count: 61.4 10*3/uL (ref 4.0–10.5)
nRBC: 0 % (ref 0.0–0.2)

## 2023-08-12 LAB — LACTATE DEHYDROGENASE: LDH: 206 U/L — ABNORMAL HIGH (ref 98–192)

## 2023-08-12 MED ORDER — CITALOPRAM HYDROBROMIDE 10 MG PO TABS: 10.0000 mg | ORAL_TABLET | Freq: Every day | ORAL | 1 refills | Status: DC

## 2023-08-12 MED ORDER — ONDANSETRON 4 MG PO TBDP: 4.0000 mg | ORAL_TABLET | Freq: Three times a day (TID) | ORAL | 1 refills | Status: AC | PRN

## 2023-08-12 NOTE — Progress Notes (Signed)
No appetite, 1 boost every other day. Having nausea, takes zofran. Burping a lot.  Was seen for back pain/spasms in Nov 2024, CT done. Saw Dr. Mariah Milling at [facility].  C/o with trouble swallowing pills.

## 2023-08-12 NOTE — Assessment & Plan Note (Addendum)
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  NEAGTIVE for JAK2 mutation; MPL; CALR mutation; PDGRA-NEGATIVE.   # Reviewed that NOV white count rising 50-60 -RISING ; Hb 8.9-  platelets -127.  Patient continues to be fairly asymptomatic except for ongoing fatigue.  Discussed the treatment options for leukemia include Hydrea/decitabine.  However, patient also patient declines venofer/Retacrit at this time.  # back pain/spasm [Dr.Morales]- a/p epidural- no improvement- currently on prn oxycodone- stable.   # GERD/reflux-Recommend NEXIUM over the counter- in AM prior to breakfast/Dinner. Continue TUMS [over the counter] prn.  # Depression: recommend Celexa     # CKD stage III [43]- [place]- APRIl 2023- NO hydronephrosis.   # weight loss: Discussed regarding protein intake/muscle health.  # refer to nutrition/Joli re: weight loss  # Palliative care evaluation: S/p evaluation with Josh Borders.    # DISPOSITION: # NO Retacrit; no venofer # follow up in 2  months;1 week prior- labs- cbc/cmp; LDH; iron studies; ferritin;  possible venofer or Aranesp- - -Dr.B

## 2023-08-12 NOTE — Progress Notes (Signed)
[facility] OFFICE PROGRESS NOTE  Patient Care Team: Marisue Ivan, [name], MD as Consulting Physician (Oncology)   # HEMATOLOGY HISTORY:  # LEUCOCYTOSIS- [FEB 2023- PCP] WBC 35 [predominant monocytosis; mild lymphocytosis; moderate neutrophilia]; [name]; platelets- 180; [name], white count elevated.     RBC (Red Blood Cell Count) 4.04 - 5.48 10^6/uL 3.02 Low     Hemoglobin 12.0 - 15.0 gm/dL 09.8 Low     Hematocrit 35.0 - 47.0 % 31.9 Low     MCV (Mean Corpuscular Volume) 80.0 - 100.0 fl 105.6 High     MCH (Mean Corpuscular Hemoglobin) 27.0 - 31.2 pg 34.8 High     MCHC (Mean Corpuscular Hemoglobin Concentration) 32.0 - 36.0 gm/dL 11.9    Platelet Count 150 - 450 10^3/uL 180    RDW-CV (Red Cell Distribution Width) 11.6 - 14.8 % 14.5    MPV (Mean Platelet Volume) 9.4 - 12.4 fl 9.1 Low     Neutrophils 1.50 - 7.80 10^3/uL 16.28 High     Lymphocytes 1.00 - 3.60 10^3/uL 4.61 High     Monocytes 0.00 - 1.50 10^3/uL 16.92 High     Eosinophils 0.00 - 0.55 10^3/uL 0.48    Basophils 0.00 - 0.09 10^3/uL 0.07    Neutrophil % 32.0 - 70.0 % 41.2    Lymphocyte % 10.0 - 50.0 % 11.7    Monocyte % 4.0 - 13.0 % 42.8 High   See manual diff and path review.   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   Accompanied by daughter  Amanda Rose 89 y.o.  female pleasant chronic myelomonocytic leukemia  currently on surveillance is here for follow-up.  In the interim patient was evaluated with PCP for back spasms.  Status post evaluation with pain physician.  Patient currently on oxycodone as needed.  Complains of poor appetite, 1 boost every other day.  Patient having nausea, takes zofran. Burping a lot. C/o with trouble swallowing pills  Pt states she feels sad about all the things she can't do anymore.Denies any blood in stools or blood in the stools.  No nausea or vomiting.  Review of Systems  Constitutional:  Positive for malaise/fatigue. Negative for chills, [name], palpitations, orthopnea and leg swelling.  Gastrointestinal:  Negative for abdominal pain, blood in stool, constipation, diarrhea, heartburn, melena, nausea and vomiting.  Genitourinary:  Negative for dysuria, frequency and urgency.  Musculoskeletal:  Negative for joint pain.  Skin: Negative.  Negative for itching and rash.  Neurological:  Negative for dizziness, tingling, focal weakness, weakness and headaches.  Endo/Heme/Allergies:  Does not bruise/bleed easily.  Psychiatric/Behavioral:  Negative for depression. The patient is not nervous/anxious and does not have insomnia.       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  achieved remission (HCC)    Hypothyroidism    Lower extremity weakness    Pre-diabetes    Thyroid disease    Weakness of both legs     PAST SURGICAL HISTORY :   Past Surgical History:  Procedure Laterality Date   IR KYPHO EA ADDL LEVEL THORACIC OR LUMBAR  12/17/2021   IR KYPHO THORACIC WITH BONE BIOPSY  12/17/2021   IR RADIOLOGIST EVAL & MGMT  12/09/2021   KYPHOPLASTY N/A 04/05/2021   Procedure: T10 and L2 KYPHOPLASTY;  [name], MD;  [name];  Service: Orthopedics;  [name];    FAMILY HISTORY :   Family History  Problem Relation Age of Onset   Cancer Father        unknown    SOCIAL HISTORY:   Social History   Tobacco Use   Smoking status: Never   Smokeless tobacco: Never  Vaping Use   Vaping status: Never Used  Substance Use Topics   Alcohol use: Never   Drug use: Never    ALLERGIES:  is allergic to other.  MEDICATIONS:  Current Outpatient Medications  Medication Sig Dispense Refill   cetirizine (ZYRTEC) 10 MG tablet Take 10 mg by mouth in the morning.     Cholecalciferol (VITAMIN D-3) 125 MCG (5000 UT) TABS Take 5,000 Units by mouth in the morning.     citalopram (CELEXA) 10 MG tablet Take 1 tablet (10 mg total) by mouth daily. 90 tablet 1   Cyanocobalamin (VITAMIN B-12) 5000 MCG SUBL Take 5,000 mcg by mouth in the morning.     diphenhydrAMINE (SOMINEX) 25 MG tablet Take 25 mg by mouth at bedtime as needed for sleep. Sleep Aid     levothyroxine (SYNTHROID) 100 MCG tablet Take 100 mcg by mouth daily before breakfast.     Magnesium 100 MG CAPS Take by mouth.     Multiple Vitamin (MULTIVITAMIN WITH MINERALS) TABS tablet Take 1 tablet by mouth in the morning. Equate Complete Multivitamin     ondansetron (ZOFRAN-ODT) 4 MG disintegrating tablet Take 1 tablet (4 mg total) by mouth every 8 (eight) hours as needed. 90 tablet 1   No current facility-administered medications for this visit.    PHYSICAL EXAMINATION:  BP (!) 123/59 (BP  Location: Left Arm, Patient Position: Sitting, Cuff Size: Normal)   Pulse 85   Temp (!) 97.5 F (36.4 C) (Tympanic)   Ht 4\' 11"  (1.499 m)   Wt 110 lb 9.6 oz (50.2 kg)   SpO2 97%   BMI 22.34 kg/m   Filed Weights   08/12/23 1017  Weight: 110 lb 9.6 oz (50.2 kg)      Physical Exam Vitals and nursing note reviewed.  HENT:     Head: Normocephalic and atraumatic.     Mouth/Throat:     Pharynx: Oropharynx is clear.  Eyes:     Extraocular Movements: Extraocular movements intact.  Pupils: Pupils are equal, round, and reactive to light.  Cardiovascular:     Rate and Rhythm: Normal rate and regular rhythm.  Pulmonary:     Comments: Decreased breath sounds bilaterally.  Abdominal:     Palpations: Abdomen is soft.  Musculoskeletal:        General: Normal range of motion.     Cervical back: Normal range of motion.  Skin:    General: Skin is warm.  Neurological:     General: No focal deficit present.     Mental Status: She is alert and oriented to person, place, and time.  Psychiatric:        Behavior: Behavior normal.        Judgment: Judgment normal.        LABORATORY DATA:  I have reviewed the data as listed    Component Value Date/Time   NA 136 08/12/2023 1000   K 3.4 (L) 08/12/2023 1000   CL 102 08/12/2023 1000   CO2 23 08/12/2023 1000   GLUCOSE 143 (H) 08/12/2023 1000   BUN 33 (H) 08/12/2023 1000   CREATININE 1.62 (H) 08/12/2023 1000   CALCIUM 8.9 08/12/2023 1000   PROT 7.2 08/12/2023 1000   ALBUMIN 4.0 08/12/2023 1000   AST 18 08/12/2023 1000   ALT 12 08/12/2023 1000   ALKPHOS 66 08/12/2023 1000   BILITOT 0.2 08/12/2023 1000   GFRNONAA 30 (L) 08/12/2023 1000   GFRAA >60 12/03/2017 1003    No results found for: "SPEP", "UPEP"  Lab Results  Component Value Date   WBC 61.4 (HH) 08/12/2023   NEUTROABS 23.3 (H) 08/12/2023   HGB 8.9 (L) 08/12/2023   HCT 27.1 (L) 08/12/2023   MCV 101.5 (H) 08/12/2023   PLT 127 (L) 08/12/2023      Chemistry       Component Value Date/Time   NA 136 08/12/2023 1000   K 3.4 (L) 08/12/2023 1000   CL 102 08/12/2023 1000   CO2 23 08/12/2023 1000   BUN 33 (H) 08/12/2023 1000   CREATININE 1.62 (H) 08/12/2023 1000      Component Value Date/Time   CALCIUM 8.9 08/12/2023 1000   ALKPHOS 66 08/12/2023 1000   AST 18 08/12/2023 1000   ALT 12 08/12/2023 1000   BILITOT 0.2 08/12/2023 1000       RADIOGRAPHIC STUDIES: I have personally reviewed the radiological images as listed and agreed with the findings in the report. No results found.   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  NEAGTIVE for JAK2 mutation; MPL; CALR mutation; PDGRA-NEGATIVE.   # Reviewed that NOV white count rising 50-60 -RISING ; Hb 8.9-  platelets -127.  Patient continues to be fairly asymptomatic except for ongoing fatigue.  Discussed the treatment options for leukemia include Hydrea/decitabine.  However, patient also patient declines venofer/Retacrit at this time.  # back pain/spasm [Dr.Morales]- a/p epidural- no improvement- currently on prn oxycodone- stable.   # GERD/reflux-Recommend NEXIUM over the counter- in AM prior to breakfast/Dinner. Continue TUMS [over the counter] prn.  # Depression: recommend Celexa     # CKD stage III [43]- [place]- APRIl 2023- NO hydronephrosis.   # weight loss: Discussed regarding protein intake/muscle health.  # refer to nutrition/Joli re: weight loss  # Palliative care evaluation: S/p evaluation with [facility]  [facility].    # DISPOSITION: # NO Retacrit; no venofer # follow up in 2  months;1 week prior- labs- cbc/cmp; LDH; iron studies; ferritin;  possible venofer or Aranesp- - -Dr.B     Orders Placed  This Encounter  Procedures   CBC with Differential (Cancer Center Only)    Standing Status:   Future    Standing Expiration Date:   08/11/2024   CMP (Cancer Center only)    Standing Status:   Future    Standing Expiration Date:   08/11/2024   Lactate dehydrogenase    Standing Status:   Future    Standing Expiration Date:   08/11/2024   Iron and TIBC    Standing Status:   Future    Standing Expiration Date:   08/11/2024   Ferritin    Standing Status:   Future    Standing Expiration Date:   08/11/2024   All questions were answered. The patient knows to call the clinic with any problems, questions or concerns.      Earna Coder, MD 08/12/2023 11:40 AM

## 2023-08-12 NOTE — Telephone Encounter (Signed)
Call returned to cancer center lab and spoke with Enzo Bi, notified of critical lab value  WBC 61.4.

## 2023-08-13 LAB — ERYTHROPOIETIN: Erythropoietin: 9.7 m[IU]/mL (ref 2.6–18.5)

## 2023-08-17 ENCOUNTER — Telehealth: Payer: Self-pay | Admitting: Internal Medicine

## 2023-08-17 NOTE — Telephone Encounter (Signed)
Pt daughter called and wants to schedule an appt with DR. B with pt so that the her brother (pt son) can speak with MD about a treatment option to [place] of life. Drug is called azacitidine.   Please call  pt daughter  Amil Amen [phone] to schedule the appt.

## 2023-08-24 ENCOUNTER — Inpatient Hospital Stay: Payer: Medicare HMO | Admitting: Internal Medicine

## 2023-08-24 ENCOUNTER — Encounter: Payer: Self-pay | Admitting: Internal Medicine

## 2023-08-24 VITALS — BP 139/57 | HR 99 | Temp 98.2°F | Ht 59.0 in | Wt 110.0 lb

## 2023-08-24 DIAGNOSIS — C9310 Chronic myelomonocytic leukemia not having achieved remission: Secondary | ICD-10-CM | POA: Diagnosis not present

## 2023-08-24 MED ORDER — MIRTAZAPINE 15 MG PO TABS: 15.0000 mg | ORAL_TABLET | Freq: Every day | ORAL | 3 refills | Status: DC

## 2023-08-24 NOTE — Assessment & Plan Note (Addendum)
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  NEAGTIVE for JAK2 mutation; MPL; CALR mutation; PDGRA-NEGATIVE.   # Reviewed that NOV white count rising 50-60 -RISING ; Hb 8.9-  platelets -127.  Patient continues to be fairly asymptomatic except for ongoing fatigue.  Discussed the treatment options for leukemia include Hydrea/azacitadine. Also discussed re:  venofer/Retacrit at this time.   # At the request of family discussed regarding use of azacitidine-for the treatment of CMML. #  I reviewed at length the individual components with chemotherapy; and the schedule in detail.  I also discussed the potential side effects including but not limited to-increasing fatigue, nausea vomiting, diarrhea, hair loss, sores in the mouth, increase risk of infection.    # At the end of discussion patient-stated that she will get back to sooner if she is interested.  Otherwise she will follow-up with [place] in February as planned  # back pain/spasm [Dr.Morales]- a/p epidural- no improvement- currently on prn oxycodone- stable.   # GERD/reflux-Recommend NEXIUM over the counter- in AM prior to breakfast/Dinner. Continue TUMS [over the counter] prn.  # Depression: Celexa x1 - noted to have dizzy spells.  Given the ongoing insomnia: Recommend Remeron 15 mg nightly.  Discontinue Celexa  # CKD stage III [43]- [place]- APRIl 2023- NO hydronephrosis.   # weight loss: Discussed regarding protein intake/muscle health.  Remeron as above  # Palliative care evaluation: S/p evaluation with Josh Borders.    # DISPOSITION: # keep appts as planned- # follow up in feb ;1 week prior- labs- cbc/cmp; LDH; iron studies; ferritin;  possible venofer or Aranesp- - -Dr.B

## 2023-08-24 NOTE — Progress Notes (Signed)
Pt was seen recently in clinic with daughter. This appt was requested so that son can hear about treatment options.  C/o not sleeping well. Taking an otc sleep aid zzzquil.   Appetite 50% normal, has one boost per day. Having trouble swallowing pills. Taking nexium.   Having some nausea, taking zofran, helps some.  Pt states she is afraid to take the celexa due to having a panic attack. She states when she takes the celexa and zofran together her heart rate went up, made her head feel off, and sick to her stomach.  Thayer Ohm, her son, would like to discuss azacitine.

## 2023-08-24 NOTE — Progress Notes (Signed)
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, white count elevated.     RBC (Red Blood Cell Count) 4.04 - 5.48 10^6/uL 3.02 Low     Hemoglobin 12.0 - 15.0 gm/dL 78.4 Low     Hematocrit 35.0 - 47.0 % 31.9 Low     MCV (Mean Corpuscular Volume) 80.0 - 100.0 fl 105.6 High     MCH (Mean Corpuscular Hemoglobin) 27.0 - 31.2 pg 34.8 High     MCHC (Mean Corpuscular Hemoglobin Concentration) 32.0 - 36.0 gm/dL 69.6    Platelet Count 150 - 450 10^3/uL 180    RDW-CV (Red Cell Distribution Width) 11.6 - 14.8 % 14.5    MPV (Mean Platelet Volume) 9.4 - 12.4 fl 9.1 Low     Neutrophils 1.50 - 7.80 10^3/uL 16.28 High     Lymphocytes 1.00 - 3.60 10^3/uL 4.61 High     Monocytes 0.00 - 1.50 10^3/uL 16.92 High     Eosinophils 0.00 - 0.55 10^3/uL 0.48    Basophils 0.00 - 0.09 10^3/uL 0.07    Neutrophil % 32.0 - 70.0 % 41.2    Lymphocyte % 10.0 - 50.0 % 11.7    Monocyte % 4.0 - 13.0 % 42.8 High   See manual diff and path review.   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   Accompanied by  son, Thayer Ohm.   Amanda Rose 89 y.o.  female pleasant chronic myelomonocytic leukemia  currently on surveillance is here for follow-up.   Patient c/o not sleeping well. Taking an otc sleep aid zzzquil.   Appetite 50% normal, has one boost per day. Having trouble swallowing pills. Taking nexium.    Having some nausea, taking zofran, helps some. Pt states she is afraid to take the celexa due to having a panic attack. She states when she takes the celexa and zofran together her heart rate went up, made her head feel off, and sick to her stomach.   Thayer Ohm, her son, would like to discuss azacitine.   Review of Systems  Constitutional:  Positive for malaise/fatigue. Negative for chills, diaphoresis and fever.  HENT:  Negative for nosebleeds and sore throat.   Eyes:  Negative for double vision.  Respiratory:  Negative for hemoptysis and wheezing.  Cardiovascular:  Negative for chest pain, palpitations, orthopnea and leg swelling.  Gastrointestinal:  Negative for abdominal pain, blood in stool, constipation, diarrhea, heartburn, melena, nausea and vomiting.  Genitourinary:  Negative for dysuria, frequency and urgency.  Musculoskeletal:  Negative for joint pain.  Skin: Negative.  Negative for itching and rash.  Neurological:  Negative for dizziness, tingling, focal weakness, weakness and headaches.  Endo/Heme/Allergies:  Does not bruise/bleed easily.  Psychiatric/Behavioral:  Negative for depression. The patient is not nervous/anxious and does not have insomnia.       PAST MEDICAL HISTORY :  Past Medical History:  Diagnosis Date   Chronic  myelomonocytic leukemia not having achieved remission (HCC)    Hypothyroidism    Lower extremity weakness    Pre-diabetes    Thyroid disease    Weakness of both legs     PAST SURGICAL HISTORY :   Past Surgical History:  Procedure Laterality Date   IR KYPHO EA ADDL LEVEL THORACIC OR LUMBAR  12/17/2021   IR KYPHO THORACIC WITH BONE BIOPSY  12/17/2021   IR RADIOLOGIST EVAL & MGMT  12/09/2021   KYPHOPLASTY N/A 04/05/2021   Procedure: T10 and L2 KYPHOPLASTY;  [name], MD;  [name];  Service: Orthopedics;  [name];    FAMILY HISTORY :   Family History  Problem Relation Age of Onset   Cancer Father        unknown    SOCIAL HISTORY:   Social History   Tobacco Use   Smoking status: Never   Smokeless tobacco: Never  Vaping Use   Vaping status: Never Used  Substance Use Topics   Alcohol use: Never   Drug use: Never    ALLERGIES:  is allergic to other.  MEDICATIONS:  Current Outpatient Medications  Medication Sig Dispense Refill   cetirizine (ZYRTEC) 10 MG tablet Take 10 mg by mouth in the morning.     Cholecalciferol (VITAMIN D-3) 125 MCG (5000 UT) TABS Take 5,[name],000 mcg by mouth in the morning.     diphenhydrAMINE HCl, Sleep, (ZZZQUIL) 25 MG CAPS Take 25 mg by mouth at bedtime.     levothyroxine (SYNTHROID) 100 MCG tablet Take 100 mcg by mouth daily before breakfast.     Magnesium 100 MG CAPS Take by mouth.     mirtazapine (REMERON) 15 MG tablet Take 1 tablet (15 mg total) by mouth at bedtime. 30 tablet 3   Multiple Vitamin (MULTIVITAMIN WITH MINERALS) TABS tablet Take 1 tablet by mouth in the morning. Equate Complete Multivitamin     ondansetron (ZOFRAN-ODT) 4 MG disintegrating tablet Take 1 tablet (4 mg total) by mouth every 8 (eight) hours as needed. 90 tablet 1   No current facility-administered medications for this visit.    PHYSICAL EXAMINATION:  BP  (!) 139/57 (BP Location: Left Arm, Patient Position: Sitting, Cuff Size: Normal)   Pulse 99   Temp 98.2 F (36.8 C) (Tympanic)   Ht 4\' 11"  (1.499 m)   Wt 110 lb (49.9 kg)   SpO2 96%   BMI 22.22 kg/m   Filed Weights   08/24/23 1437  Weight: 110 lb (49.9 kg)       Physical Exam Vitals and nursing note reviewed.  HENT:     Head: Normocephalic and atraumatic.     Mouth/Throat:     Pharynx: Oropharynx is clear.  Eyes:     Extraocular  Movements: Extraocular movements intact.     Pupils: Pupils are equal, round, and reactive to light.  Cardiovascular:     Rate and Rhythm: Normal rate and regular rhythm.  Pulmonary:     Comments: Decreased breath sounds bilaterally.  Abdominal:     Palpations: Abdomen is soft.  Musculoskeletal:        General: Normal range of motion.     Cervical back: Normal range of motion.  Skin:    General: Skin is warm.  Neurological:     General: No focal deficit present.     Mental Status: She is alert and oriented to person, place, and time.  Psychiatric:        Behavior: Behavior normal.        Judgment: Judgment normal.        LABORATORY DATA:  I have reviewed the data as listed    Component Value Date/Time   NA 136 08/12/2023 1000   K 3.4 (L) 08/12/2023 1000   CL 102 08/12/2023 1000   CO2 23 08/12/2023 1000   GLUCOSE 143 (H) 08/12/2023 1000   BUN 33 (H) 08/12/2023 1000   CREATININE 1.62 (H) 08/12/2023 1000   CALCIUM 8.9 08/12/2023 1000   PROT 7.2 08/12/2023 1000   ALBUMIN 4.0 08/12/2023 1000   AST 18 08/12/2023 1000   ALT 12 08/12/2023 1000   ALKPHOS 66 08/12/2023 1000   BILITOT 0.2 08/12/2023 1000   GFRNONAA 30 (L) 08/12/2023 1000   GFRAA >60 12/03/2017 1003    No results found for: "SPEP", "UPEP"  Lab Results  Component Value Date   WBC 61.4 (HH) 08/12/2023   NEUTROABS 23.3 (H) 08/12/2023   HGB 8.9 (L) 08/12/2023   HCT 27.1 (L) 08/12/2023   MCV 101.5 (H) 08/12/2023   PLT 127 (L) 08/12/2023      Chemistry       Component Value Date/Time   NA 136 08/12/2023 1000   K 3.4 (L) 08/12/2023 1000   CL 102 08/12/2023 1000   CO2 23 08/12/2023 1000   BUN 33 (H) 08/12/2023 1000   CREATININE 1.62 (H) 08/12/2023 1000      Component Value Date/Time   CALCIUM 8.9 08/12/2023 1000   ALKPHOS 66 08/12/2023 1000   AST 18 08/12/2023 1000   ALT 12 08/12/2023 1000   BILITOT 0.2 08/12/2023 1000       RADIOGRAPHIC STUDIES: I have personally reviewed the radiological images as listed and agreed with the findings in the report. No results found.   ASSESSMENT & PLAN:  Chronic myelomonocytic leukemia not having achieved remission [facility]) #Chronic myelomonocytic leukemia- April 2023-bone marrow biopsy: Hypercellular marrow with myeloid hyperplasia and monocytosis worrisome for a myeloid  proliferative/myelodysplastic syndrome, specifically chronic  myelomonocytic leukemia; myeloid panel.  NEAGTIVE for JAK2 mutation; MPL; CALR mutation; PDGRA-NEGATIVE.   # Reviewed that NOV white count rising 50-60 -RISING ; Hb 8.9-  platelets -127.  Patient continues to be fairly asymptomatic except for ongoing fatigue.  Discussed the treatment options for leukemia include Hydrea/azacitadine. Also discussed re:  venofer/Retacrit at this time.   # At the request of family discussed regarding use of azacitidine-for the treatment of CMML. #  I reviewed at length the individual components with chemotherapy; and the schedule in detail.  I also discussed the potential side effects including but not limited to-increasing fatigue, nausea vomiting, diarrhea, hair loss, sores in the mouth, increase risk of infection.    # At the end of discussion patient-stated that she will get  back to sooner if she is interested.  Otherwise she will follow-up with [place] in February as planned  # back pain/spasm [Dr.Morales]- a/p epidural- no improvement- currently on prn oxycodone- stable.   # GERD/reflux-Recommend NEXIUM over the counter- in AM prior to  breakfast/Dinner. Continue TUMS [over the counter] prn.  # Depression: Celexa x1 - noted to have dizzy spells.  Given the ongoing insomnia: Recommend Remeron 15 mg nightly.  Discontinue Celexa  # CKD stage III [43]- [place]- APRIl 2023- NO hydronephrosis.   # weight loss: Discussed regarding protein intake/muscle health.  Remeron as above  # Palliative care evaluation: S/p evaluation with Josh Borders.    # DISPOSITION: # keep appts as planned- # follow up in feb ;1 week prior- labs- cbc/cmp; LDH; iron studies; ferritin;  possible venofer or Aranesp- - -Dr.B     No orders of the defined types were placed in this encounter.  All questions were answered. The patient knows to call the clinic with any problems, questions or concerns.      Earna Coder, MD 08/24/2023 3:57 PM

## 2023-08-27 ENCOUNTER — Telehealth: Payer: Self-pay | Admitting: Pharmacist

## 2023-08-27 ENCOUNTER — Other Ambulatory Visit: Payer: Self-pay | Admitting: Internal Medicine

## 2023-08-27 ENCOUNTER — Telehealth: Payer: Self-pay

## 2023-08-27 ENCOUNTER — Encounter: Payer: Self-pay | Admitting: Internal Medicine

## 2023-08-27 ENCOUNTER — Other Ambulatory Visit (HOSPITAL_COMMUNITY): Payer: Self-pay

## 2023-08-27 ENCOUNTER — Telehealth: Payer: Self-pay | Admitting: Internal Medicine

## 2023-08-27 DIAGNOSIS — C9310 Chronic myelomonocytic leukemia not having achieved remission: Secondary | ICD-10-CM

## 2023-08-27 MED ORDER — INQOVI 35-100 MG PO TABS
1.0000 | ORAL_TABLET | Freq: Every day | ORAL | 0 refills | Status: DC
  Filled 2023-09-07: qty 5, 28d supply, fill #0

## 2023-08-27 NOTE — Telephone Encounter (Addendum)
Oral Oncology Patient Advocate Encounter  Prior Authorization for [name] has been approved.    PA# [id number]  Effective dates: 09/08/22 through 09/08/23  Patients co-pay is $2,503.29.   [employer] obtained to make co-pay $0.00.   Ardeen Fillers, CPhT Oncology Pharmacy Patient Advocate  [facility]  [phone] (phone) [phone] (fax) 08/27/2023 2:14 PM

## 2023-08-27 NOTE — Telephone Encounter (Signed)
Oral Oncology Patient Advocate Encounter  New authorization   Received notification that prior authorization for Inqovi is required.   PA submitted on 08/27/23  [facility]  Status is pending     Ardeen Fillers, CPhT Oncology Pharmacy Patient Advocate  [facility]  [phone] (phone) [phone] (fax) 08/27/2023 2:10 PM

## 2023-08-27 NOTE — Telephone Encounter (Signed)
Clinical Pharmacist Practitioner Encounter   Received new prescription for Inqovi (cedazuridine/decitabine) for the treatment of Chronic myelomonocytic leukemia worrisome for a myeloid proliferative/myelodysplastic syndrome, planned duration until disease progression or unacceptable drug toxicity.  CBC from 08/12/23 assessed, WBC elevated, continue to monitor. Prescription dose and frequency assessed.   Current medication list in Epic reviewed, no DDIs with Inqovi identified.  Evaluated chart and no patient barriers to medication adherence identified.   Prescription has been e-scribed to the [facility] for benefits analysis and approval.  Oral Oncology Clinic will continue to follow for insurance authorization, copayment issues, initial counseling and start date.   Remi Haggard, PharmD, BCPS, BCOP, CPP Hematology/Oncology Clinical Pharmacist Practitioner [facility] [phone]  08/27/2023 2:58 PM

## 2023-08-27 NOTE — Telephone Encounter (Signed)
I spoke to patient's son Amanda Rose [phone].  Interested in proceeding with hypomethylating agents; and Aranesp.  Discussed the option of aza/decitabine; and also oral Inqovi.  Interested in oral option.  Will check with pharmacy.  Will reach out to family/son regarding setting up appointments based upon inqovi logistics.

## 2023-08-27 NOTE — Telephone Encounter (Signed)
Oral Oncology Patient Advocate Encounter  Was successful in securing patient a $10,000.00 grant from [employer] to provide copayment coverage for Inqovi.  This will keep the out of pocket expense at $0.     [employer] ID: [id number]   The billing information is as follows and has been shared with [facility].    RxBin: [id number] PCN: [id number] Member ID: [id number] Group ID: [id number] Dates of Eligibility: 07/28/23 through 07/26/24  Fund:  Myelodysplastic Syndromes - Medicare Access   Ardeen Fillers, CPhT Oncology Pharmacy Patient Advocate  [facility]  [phone] (phone) [phone] (fax) 08/27/2023 2:26 PM

## 2023-08-27 NOTE — Progress Notes (Signed)
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 

## 2023-08-27 NOTE — Telephone Encounter (Signed)
Pt son, Thayer Ohm, called and stated that pt would like to proceed with injections that were discussed at her last visit. He would like a call back at [phone]

## 2023-08-28 ENCOUNTER — Telehealth: Payer: Self-pay | Admitting: Internal Medicine

## 2023-08-28 NOTE — Telephone Encounter (Signed)
Called patients son to inform of appointments- no answer left voicemail. Per chat

## 2023-09-04 ENCOUNTER — Telehealth: Payer: Self-pay | Admitting: *Deleted

## 2023-09-04 NOTE — Telephone Encounter (Signed)
Patient's daughter has question about the oral chemo patient is scheduled to start.

## 2023-09-07 ENCOUNTER — Other Ambulatory Visit: Payer: Self-pay

## 2023-09-07 ENCOUNTER — Other Ambulatory Visit (HOSPITAL_COMMUNITY): Payer: Self-pay

## 2023-09-07 NOTE — Progress Notes (Signed)
Specialty Pharmacy Initial Fill Coordination Note  Amanda Rose is a 89 y.o. female contacted today regarding initial fill of specialty medication(s) Decitabine-Cedazuridine [name])  Patient requested Delivery   Delivery date: 09/10/23   Verified address: [address] [place], [place] [address]  Medication will be filled on 09/08/23.   Patient is aware of $0.00 copayment. Bill [employer] Secondary.    Ardeen Fillers, CPhT Oncology Pharmacy Patient Advocate  [facility]  [phone] (phone) [phone] (fax) 09/07/2023 11:07 AM

## 2023-09-07 NOTE — Telephone Encounter (Signed)
Patient successfully OnBoarded and drug education provided by pharmacist. Medication scheduled to be shipped on Thursday, 09/10/23, for delivery on Friday, 09/11/23, from [facility] to patient's address. Patient also knows to call me at [phone] with any questions or concerns regarding receiving medication or if there is any unexpected change in co-pay.    Ardeen Fillers, CPhT Oncology Pharmacy Patient Advocate  [facility]  [phone] (phone) [phone] (fax) 09/07/2023 2:38 PM

## 2023-09-07 NOTE — Progress Notes (Signed)
Specialty Pharmacy Initiation Note   Amanda Rose is a 89 y.o. female who will be followed by the specialty pharmacy service for RxSp Oncology    Review of administration, indication, effectiveness, safety, potential side effects, storage/disposable, and missed dose instructions occurred today for patient's specialty medication(s) Decitabine-Cedazuridine Amanda Rose)     Patient/Caregiver did not have any additional questions or concerns.   Patient's therapy is appropriate to: Initiate    Goals Addressed             This Visit's Progress    Achieve or maintain remission       Patient is initiating therapy. Patient will maintain adherence         Amanda Rose Specialty Pharmacist

## 2023-09-10 ENCOUNTER — Other Ambulatory Visit: Payer: Self-pay

## 2023-09-10 ENCOUNTER — Encounter: Payer: Self-pay | Admitting: Internal Medicine

## 2023-09-10 DIAGNOSIS — C9310 Chronic myelomonocytic leukemia not having achieved remission: Secondary | ICD-10-CM

## 2023-09-14 ENCOUNTER — Encounter: Payer: Self-pay | Admitting: Internal Medicine

## 2023-09-14 ENCOUNTER — Inpatient Hospital Stay (HOSPITAL_BASED_OUTPATIENT_CLINIC_OR_DEPARTMENT_OTHER): Payer: Medicare HMO | Admitting: Internal Medicine

## 2023-09-14 ENCOUNTER — Ambulatory Visit: Payer: Medicare HMO

## 2023-09-14 ENCOUNTER — Ambulatory Visit
Admission: RE | Admit: 2023-09-14 | Discharge: 2023-09-14 | Disposition: A | Discharge status: Home / Self Care | Payer: Medicare HMO | Attending: Internal Medicine | Admitting: Internal Medicine

## 2023-09-14 ENCOUNTER — Inpatient Hospital Stay: Payer: Medicare HMO | Attending: Internal Medicine

## 2023-09-14 ENCOUNTER — Inpatient Hospital Stay: Payer: Medicare HMO | Admitting: Pharmacist

## 2023-09-14 ENCOUNTER — Ambulatory Visit
Admission: RE | Admit: 2023-09-14 | Discharge: 2023-09-14 | Disposition: A | Discharge status: Home / Self Care | Payer: Medicare HMO | Source: Ambulatory Visit | Attending: Internal Medicine | Admitting: Internal Medicine

## 2023-09-14 VITALS — BP 133/73 | HR 75 | Temp 98.4°F | Resp 18 | Wt 110.0 lb

## 2023-09-14 DIAGNOSIS — C9310 Chronic myelomonocytic leukemia not having achieved remission: Secondary | ICD-10-CM | POA: Insufficient documentation

## 2023-09-14 DIAGNOSIS — F32A Depression, unspecified: Secondary | ICD-10-CM | POA: Diagnosis not present

## 2023-09-14 DIAGNOSIS — R634 Abnormal weight loss: Secondary | ICD-10-CM | POA: Diagnosis not present

## 2023-09-14 DIAGNOSIS — R059 Cough, unspecified: Secondary | ICD-10-CM

## 2023-09-14 DIAGNOSIS — N183 Chronic kidney disease, stage 3 unspecified: Secondary | ICD-10-CM | POA: Diagnosis not present

## 2023-09-14 DIAGNOSIS — Z79899 Other long term (current) drug therapy: Secondary | ICD-10-CM | POA: Diagnosis not present

## 2023-09-14 LAB — CBC WITH DIFFERENTIAL (CANCER CENTER ONLY)
Abs Immature Granulocytes: 3.22 10*3/uL — ABNORMAL HIGH (ref 0.00–0.07)
Basophils Absolute: 0.1 10*3/uL (ref 0.0–0.1)
Basophils Relative: 0 %
Eosinophils Absolute: 1.1 10*3/uL — ABNORMAL HIGH (ref 0.0–0.5)
Eosinophils Relative: 2 %
HCT: 26.9 % — ABNORMAL LOW (ref 36.0–46.0)
Hemoglobin: 8.9 g/dL — ABNORMAL LOW (ref 12.0–15.0)
Immature Granulocytes: 5 %
Lymphocytes Relative: 6 %
Lymphs Abs: 3.8 10*3/uL (ref 0.7–4.0)
MCH: 33 pg (ref 26.0–34.0)
MCHC: 33.1 g/dL (ref 30.0–36.0)
MCV: 99.6 fL (ref 80.0–100.0)
Monocytes Absolute: 30.4 10*3/uL — ABNORMAL HIGH (ref 0.1–1.0)
Monocytes Relative: 48 %
Neutro Abs: 25.1 10*3/uL — ABNORMAL HIGH (ref 1.7–7.7)
Neutrophils Relative %: 39 %
Platelet Count: 165 10*3/uL (ref 150–400)
RBC: 2.7 MIL/uL — ABNORMAL LOW (ref 3.87–5.11)
RDW: 13.8 % (ref 11.5–15.5)
Smear Review: NORMAL
WBC Count: 63.7 10*3/uL (ref 4.0–10.5)
nRBC: 0 % (ref 0.0–0.2)

## 2023-09-14 LAB — CMP (CANCER CENTER ONLY)
ALT: 10 U/L (ref 0–44)
AST: 18 U/L (ref 15–41)
Albumin: 3.8 g/dL (ref 3.5–5.0)
Alkaline Phosphatase: 80 U/L (ref 38–126)
Anion gap: 11 (ref 5–15)
BUN: 35 mg/dL — ABNORMAL HIGH (ref 8–23)
CO2: 24 mmol/L (ref 22–32)
Calcium: 8.9 mg/dL (ref 8.9–10.3)
Chloride: 102 mmol/L (ref 98–111)
Creatinine: 1.58 mg/dL — ABNORMAL HIGH (ref 0.44–1.00)
GFR, Estimated: 31 mL/min — ABNORMAL LOW (ref >60)
Glucose, Bld: 148 mg/dL — ABNORMAL HIGH (ref 70–99)
Potassium: 3.8 mmol/L (ref 3.5–5.1)
Sodium: 137 mmol/L (ref 135–145)
Total Bilirubin: 0.5 mg/dL (ref 0.0–1.2)
Total Protein: 7.3 g/dL (ref 6.5–8.1)

## 2023-09-14 LAB — IRON AND TIBC
Iron: 85 ug/dL (ref 28–170)
Saturation Ratios: 24 % (ref 10.4–31.8)
TIBC: 354 ug/dL (ref 250–450)
UIBC: 269 ug/dL

## 2023-09-14 LAB — FERRITIN: Ferritin: 201 ng/mL (ref 11–307)

## 2023-09-14 LAB — LACTATE DEHYDROGENASE: LDH: 222 U/L — ABNORMAL HIGH (ref 98–192)

## 2023-09-14 MED ORDER — OXYCODONE HCL 5 MG PO TABS: 5.0000 mg | ORAL_TABLET | Freq: Three times a day (TID) | ORAL | 0 refills | Status: DC | PRN

## 2023-09-14 NOTE — Progress Notes (Signed)
 Pt has gained weight this time. Appetite is not so great. Having "muscle pain/spasms" in Right side. Going to start new oral med for [facility] today. Critical lab received today WBC 63.7. Dr Donneta Romberg notified.

## 2023-09-14 NOTE — Assessment & Plan Note (Addendum)
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  NEAGTIVE for JAK2 mutation; MPL; CALR mutation; PDGRA-NEGATIVE.   # Reviewed that NOV white count rising 50-60 -RISING ; Hb 8.9-  platelets -167  Patient continues to be fairly asymptomatic except for ongoing fatigue.  Patient family interested in proceeding with  INQOVI  [po decitabine ]-however hold today given the acute issues see below.  Again declines venofer/Retacrit at this time.   # Right upper quadrant pain-pleuritic for 5 days/ cough- no fevers; no rash- LFTs - wnl. Hx of gallstones- no acute cholecystsitis- not s/o of PE. Check CXR.  Recommend oxycodone  as needed as needed.  # back pain/spasm [Dr.Morales]- a/p epidural- no improvement- currently on prn oxycodone - stable.   # GERD/reflux-Recommend NEXIUM over the counter- in AM prior to breakfast/Dinner. Continue TUMS [over the counter] prn.  # Depression: Celexa  x1 - noted to have dizzy spells. Declined Remeron  15 mg nightly.    # CKD stage III [43]- [place] - APRIl 2023- NO hydronephrosis.   # weight loss: Discussed regarding protein intake/muscle health.  # DISPOSITION: # CXR today STAT.  # HOLD aranesp  # follow up in 1 week- labs- cbc/cmp; LDH; MD; # cancel Feb 2025 appts. -Dr.B  Addendum chest x-ray: Negative for any acute process.  Suspect right chest wall pain musculoskeletal-recommend oxycodone  as needed.  Will reevaluate in 1 week.

## 2023-09-14 NOTE — Progress Notes (Signed)
 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, white count elevated.     RBC (Red Blood Cell Count) 4.04 - 5.48 10^6/uL 3.02 Low     Hemoglobin 12.0 - 15.0 gm/dL 89.4 Low     Hematocrit 35.0 - 47.0 % 31.9 Low     MCV (Mean Corpuscular Volume) 80.0 - 100.0 fl 105.6 High     MCH (Mean Corpuscular Hemoglobin) 27.0 - 31.2 pg 34.8 High     MCHC (Mean Corpuscular Hemoglobin Concentration) 32.0 - 36.0 gm/dL 67.0    Platelet Count 150 - 450 10^3/uL 180    RDW-CV (Red Cell Distribution Width) 11.6 - 14.8 % 14.5    MPV (Mean Platelet Volume) 9.4 - 12.4 fl 9.1 Low     Neutrophils 1.50 - 7.80 10^3/uL 16.28 High     Lymphocytes 1.00 - 3.60 10^3/uL 4.61 High     Monocytes 0.00 - 1.50 10^3/uL 16.92 High     Eosinophils 0.00 - 0.55 10^3/uL 0.48    Basophils 0.00 - 0.09 10^3/uL 0.07    Neutrophil % 32.0 - 70.0 % 41.2    Lymphocyte % 10.0 - 50.0 % 11.7    Monocyte % 4.0 - 13.0 % 42.8 High   See manual diff and path review.   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   Accompanied by  son, daughter.   Amanda Rose 89 y.o.  female pleasant chronic myelomonocytic leukemia  currently on surveillance is here for follow-up-proceed with CMML   Pt has gained weight this time. Appetite is not so great.   Patient noted to have right upper quadrant pain- for 5 days. Having muscle pain/spasms in Right side.    Back spasms have improved.  She is not on the oxycodone .   Intermittent nausea.  Not any worse.   Review of Systems  Constitutional:  Positive for malaise/fatigue. Negative for chills, [name], palpitations, orthopnea and leg  swelling.  Gastrointestinal:  Positive for abdominal pain. Negative for blood in stool, constipation, diarrhea, heartburn, melena, nausea and vomiting.  Genitourinary:  Negative for dysuria, frequency and urgency.  Musculoskeletal:  Positive for back pain. Negative for joint pain.  Skin: Negative.  Negative for itching and rash.  Neurological:  Negative for dizziness, tingling, focal weakness, weakness and headaches.  Endo/Heme/Allergies:  Does not bruise/bleed easily.  Psychiatric/Behavioral:  Negative for depression. The patient is not nervous/anxious and does not have insomnia.       PAST MEDICAL HISTORY :  Past Medical History:  Diagnosis Date   Chronic myelomonocytic leukemia not  having achieved remission (HCC)    Hypothyroidism    Lower extremity weakness    Pre-diabetes    Thyroid disease    Weakness of both legs     PAST SURGICAL HISTORY :   Past Surgical History:  Procedure Laterality Date   IR KYPHO EA ADDL LEVEL THORACIC OR LUMBAR  12/17/2021   IR KYPHO THORACIC WITH BONE BIOPSY  12/17/2021   IR RADIOLOGIST EVAL & MGMT  12/09/2021   KYPHOPLASTY N/A 04/05/2021   Procedure: T10 and L2 KYPHOPLASTY;  Surgeon: Kathlynn Sharper, MD;  [name];  Service: Orthopedics;  [name];    FAMILY HISTORY :   Family History  Problem Relation Age of Onset   Cancer Father        unknown    SOCIAL HISTORY:   Social History   Tobacco Use   Smoking status: Never   Smokeless tobacco: Never  Vaping Use   Vaping status: Never Used  Substance Use Topics   Alcohol use: Never   Drug use: Never    ALLERGIES:  is allergic to other.  MEDICATIONS:  Current Outpatient Medications  Medication Sig Dispense Refill   Cholecalciferol (VITAMIN D-3) 125 MCG (5000 UT) TABS Take 5,000 Units by mouth in the morning.     Cyanocobalamin  (VITAMIN B-12) 5000 MCG SUBL Take 5,000 mcg by mouth in the morning.     levothyroxine (SYNTHROID) 100 MCG tablet Take 100 mcg by mouth daily before breakfast.     Magnesium 100 MG CAPS Take by mouth.     mirtazapine  (REMERON ) 15 MG tablet Take 1 tablet (15 mg total) by mouth at bedtime. 30 tablet 3   oxyCODONE  (ROXICODONE ) 5 MG immediate release tablet Take 1 tablet (5 mg total) by mouth every 8 (eight) hours as needed for severe pain (pain score 7-10). 45 tablet 0   decitabine -cedazuridine  (INQOVI ) 35-100 MG oral tablet Take 1 tablet by mouth daily. Take for 5 days, hold for 23d, [name], at least 2 hr before or after food (Patient not taking: Reported on 09/14/2023) 5 tablet 0   Multiple Vitamin (MULTIVITAMIN WITH MINERALS) TABS tablet Take 1 tablet by mouth in the morning. Equate Complete Multivitamin  (Patient not taking: Reported on 09/14/2023)     ondansetron  (ZOFRAN -ODT) 4 MG disintegrating tablet Take 1 tablet (4 mg total) by mouth every 8 (eight) hours as needed. (Patient not taking: Reported on 09/14/2023) 90 tablet 1   No current facility-administered medications for this visit.    PHYSICAL EXAMINATION:  BP 133/73 (BP Location: Left Arm, Patient Position: Sitting)   Pulse 75   Temp 98.4 F (36.9 C) (Tympanic)   Resp 18   Wt 110 lb (49.9 kg)   SpO2 98%   BMI 22.22 kg/m   Filed Weights   09/14/23 0935  Weight: 110 lb (49.9  kg)    No skin rash.  No obvious right upper quadrant tenderness.   Physical Exam Vitals and nursing note reviewed.  HENT:     Head: Normocephalic and atraumatic.     Mouth/Throat:     Pharynx: Oropharynx is clear.  Eyes:     Extraocular Movements: Extraocular movements intact.     Pupils: Pupils are equal, round, and reactive to light.  Cardiovascular:     Rate and Rhythm: Normal rate and regular rhythm.  Pulmonary:     Comments: Decreased breath sounds bilaterally.  Abdominal:     Palpations: Abdomen is soft.  Musculoskeletal:        General: Normal range of motion.     Cervical back: Normal range of motion.  Skin:    General: Skin is warm.  Neurological:     General: No focal deficit present.     Mental Status: She is alert and oriented to person, place, and time.  Psychiatric:        Behavior: Behavior normal.        Judgment: Judgment normal.        LABORATORY DATA:  I have reviewed the data as listed    Component Value Date/Time   NA 137 09/14/2023 0913   K 3.8 09/14/2023 0913   CL 102 09/14/2023 0913   CO2 24 09/14/2023 0913   GLUCOSE 148 (H) 09/14/2023 0913   BUN 35 (H) 09/14/2023 0913   CREATININE 1.58 (H) 09/14/2023 0913   CALCIUM 8.9 09/14/2023 0913   PROT 7.3 09/14/2023 0913   ALBUMIN 3.8 09/14/2023 0913   AST 18 09/14/2023 0913   ALT 10 09/14/2023 0913   ALKPHOS 80 09/14/2023 0913   BILITOT 0.5 09/14/2023  0913   GFRNONAA 31 (L) 09/14/2023 0913   GFRAA >60 12/03/2017 1003    No results found for: SPEP, UPEP  Lab Results  Component Value Date   WBC 63.7 (HH) 09/14/2023   NEUTROABS 25.1 (H) 09/14/2023   HGB 8.9 (L) 09/14/2023   HCT 26.9 (L) 09/14/2023   MCV 99.6 09/14/2023   PLT 165 09/14/2023      Chemistry      Component Value Date/Time   NA 137 09/14/2023 0913   K 3.8 09/14/2023 0913   CL 102 09/14/2023 0913   CO2 24 09/14/2023 0913   BUN 35 (H) 09/14/2023 0913   CREATININE 1.58 (H) 09/14/2023 0913      Component Value Date/Time   CALCIUM 8.9 09/14/2023 0913   ALKPHOS 80 09/14/2023 0913   AST 18 09/14/2023 0913   ALT 10 09/14/2023 0913   BILITOT 0.5 09/14/2023 0913       RADIOGRAPHIC STUDIES: I have personally reviewed the radiological images as listed and agreed with the findings in the report. DG Chest 2 View Result Date: 09/14/2023 CLINICAL DATA:  Right-sided pleuritic chest pain. EXAM: CHEST - 2 VIEW COMPARISON:  Chest radiograph dated 12/03/2017. FINDINGS: Background of emphysema. No focal consolidation, pleural effusion, or pneumothorax. Stable mild cardiomegaly. Atherosclerotic calcification of the aorta. Osteopenia with degenerative changes of the spine. Multilevel thoracic compression fractures and vertebroplasty. Compression fracture of a mid to lower thoracic spine with anterior wedging and associated kyphosis. IMPRESSION: 1. No active cardiopulmonary disease. 2. Emphysema. Electronically Signed   By: Vanetta Chou M.D.   On: 09/14/2023 11:55     ASSESSMENT & PLAN:  Chronic myelomonocytic leukemia not having achieved remission [facility]) #Chronic myelomonocytic leukemia- April 2023-bone marrow biopsy: Hypercellular marrow with myeloid hyperplasia and monocytosis  worrisome for a myeloid  proliferative/myelodysplastic syndrome, specifically chronic  myelomonocytic leukemia; myeloid panel.  NEAGTIVE for JAK2 mutation; MPL; CALR mutation; PDGRA-NEGATIVE.   #  Reviewed that NOV white count rising 50-60 -RISING ; Hb 8.9-  platelets -167  Patient continues to be fairly asymptomatic except for ongoing fatigue.  Patient family interested in proceeding with  INQOVI  [po decitabine ]-however hold today given the acute issues see below.  Again declines venofer/Retacrit at this time.   # Right upper quadrant pain-pleuritic for 5 days/ cough- no fevers; no rash- LFTs - wnl. Hx of gallstones- no acute cholecystsitis- not s/o of PE. Check CXR.  Recommend oxycodone  as needed as needed.  # back pain/spasm [Dr.Morales]- a/p epidural- no improvement- currently on prn oxycodone - stable.   # GERD/reflux-Recommend NEXIUM over the counter- in AM prior to breakfast/Dinner. Continue TUMS [over the counter] prn.  # Depression: Celexa  x1 - noted to have dizzy spells. Declined Remeron  15 mg nightly.    # CKD stage III [43]- US - APRIl 2023- NO hydronephrosis.   # weight loss: Discussed regarding protein intake/muscle health.  # DISPOSITION: # CXR today STAT.  # HOLD aranesp  # follow up in 1 week- labs- cbc/cmp; LDH; MD; # cancel Feb 2025 appts. -Dr.B     Orders Placed This Encounter  Procedures   DG Chest 2 View    Standing Status:   Future    Number of Occurrences:   1    Expected Date:   09/14/2023    Expiration Date:   09/13/2024    Reason for Exam (SYMPTOM  OR DIAGNOSIS REQUIRED):   right chest pain/ pleuritic.    Preferred imaging location?:   [facility]   CBC with Differential (Cancer Center Only)    Standing Status:   Future    Expected Date:   09/21/2023    Expiration Date:   09/13/2024   CMP (Cancer Center only)    Standing Status:   Future    Expected Date:   09/21/2023    Expiration Date:   09/13/2024   Lactate dehydrogenase    Standing Status:   Future    Expected Date:   09/21/2023    Expiration Date:   09/13/2024   All questions were answered. The patient knows to call the clinic with any problems, questions or concerns.      Amanda JONELLE Joe, MD 09/14/2023 12:12 PM

## 2023-09-16 ENCOUNTER — Other Ambulatory Visit: Payer: Self-pay | Admitting: Internal Medicine

## 2023-09-21 ENCOUNTER — Other Ambulatory Visit: Payer: Medicare HMO

## 2023-09-21 ENCOUNTER — Ambulatory Visit: Payer: Medicare HMO | Admitting: Internal Medicine

## 2023-09-22 ENCOUNTER — Inpatient Hospital Stay (HOSPITAL_BASED_OUTPATIENT_CLINIC_OR_DEPARTMENT_OTHER): Payer: Medicare HMO | Admitting: Internal Medicine

## 2023-09-22 ENCOUNTER — Inpatient Hospital Stay: Payer: Medicare HMO

## 2023-09-22 ENCOUNTER — Inpatient Hospital Stay: Payer: Medicare HMO | Admitting: Pharmacist

## 2023-09-22 VITALS — BP 130/55 | HR 100 | Temp 99.0°F | Resp 14 | Wt 109.0 lb

## 2023-09-22 DIAGNOSIS — C9310 Chronic myelomonocytic leukemia not having achieved remission: Secondary | ICD-10-CM

## 2023-09-22 LAB — CBC WITH DIFFERENTIAL (CANCER CENTER ONLY)
Abs Immature Granulocytes: 3.98 10*3/uL — ABNORMAL HIGH (ref 0.00–0.07)
Basophils Absolute: 0.1 10*3/uL (ref 0.0–0.1)
Basophils Relative: 0 %
Eosinophils Absolute: 1 10*3/uL — ABNORMAL HIGH (ref 0.0–0.5)
Eosinophils Relative: 1 %
HCT: 22.8 % — ABNORMAL LOW (ref 36.0–46.0)
Hemoglobin: 7.6 g/dL — ABNORMAL LOW (ref 12.0–15.0)
Immature Granulocytes: 5 %
Lymphocytes Relative: 10 %
Lymphs Abs: 7.9 10*3/uL — ABNORMAL HIGH (ref 0.7–4.0)
MCH: 33.6 pg (ref 26.0–34.0)
MCHC: 33.3 g/dL (ref 30.0–36.0)
MCV: 100.9 fL — ABNORMAL HIGH (ref 80.0–100.0)
Monocytes Absolute: 36.2 10*3/uL — ABNORMAL HIGH (ref 0.1–1.0)
Monocytes Relative: 49 %
Neutro Abs: 26.9 10*3/uL — ABNORMAL HIGH (ref 1.7–7.7)
Neutrophils Relative %: 35 %
Platelet Count: 173 10*3/uL (ref 150–400)
RBC: 2.26 MIL/uL — ABNORMAL LOW (ref 3.87–5.11)
RDW: 13.9 % (ref 11.5–15.5)
Smear Review: NORMAL
WBC Count: 76.1 10*3/uL (ref 4.0–10.5)
nRBC: 0 % (ref 0.0–0.2)

## 2023-09-22 LAB — CMP (CANCER CENTER ONLY)
ALT: 13 U/L (ref 0–44)
AST: 20 U/L (ref 15–41)
Albumin: 3.7 g/dL (ref 3.5–5.0)
Alkaline Phosphatase: 74 U/L (ref 38–126)
Anion gap: 10 (ref 5–15)
BUN: 47 mg/dL — ABNORMAL HIGH (ref 8–23)
CO2: 23 mmol/L (ref 22–32)
Calcium: 8.6 mg/dL — ABNORMAL LOW (ref 8.9–10.3)
Chloride: 103 mmol/L (ref 98–111)
Creatinine: 1.44 mg/dL — ABNORMAL HIGH (ref 0.44–1.00)
GFR, Estimated: 35 mL/min — ABNORMAL LOW (ref >60)
Glucose, Bld: 155 mg/dL — ABNORMAL HIGH (ref 70–99)
Potassium: 3.6 mmol/L (ref 3.5–5.1)
Sodium: 136 mmol/L (ref 135–145)
Total Bilirubin: 0.4 mg/dL (ref 0.0–1.2)
Total Protein: 6.9 g/dL (ref 6.5–8.1)

## 2023-09-22 LAB — LACTATE DEHYDROGENASE: LDH: 224 U/L — ABNORMAL HIGH (ref 98–192)

## 2023-09-22 MED ORDER — ALLOPURINOL 100 MG PO TABS: 100.0000 mg | ORAL_TABLET | Freq: Two times a day (BID) | ORAL | 3 refills | Status: DC

## 2023-09-22 NOTE — Addendum Note (Signed)
 Addended by: Clydia Llano on: 09/22/2023 02:07 PM   Modules accepted: Orders

## 2023-09-22 NOTE — Assessment & Plan Note (Addendum)
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  NEAGTIVE for JAK2 mutation; MPL; CALR mutation; PDGRA-NEGATIVE.   # Reviewed that NOV white count rising 50-60 -RISING ; Hb- 7.8-  platelets -167  Patient continues to be fairly asymptomatic except for ongoing fatigue.  Patient family interested in proceeding with  INQOVI  [po decitabine ]-pt scott reluctantly agrees to  venofer/aranesp  at this time.  Also discussed regarding blood transfusion-if hemoglobin less than 8 at next visit.   # Right upper quadrant pain- chest x-ray: Negative for any acute process- resolved.   # back pain/spasm [Dr.Morales]- a/p epidural- no improvement- currently on prn oxycodone - stable.   # GERD/reflux-Recommend NEXIUM over the counter- in AM prior to breakfast/Dinner. Continue TUMS [over the counter] prn.  # Depression: Celexa  x1 - noted to have dizzy spells. Declined Remeron  15 mg nightly.    # CKD stage III [43]- US - APRIl 2023- NO hydronephrosis.  Risk of tumor lysis: Start patient on allopurinol  100 g twice a day. Will check Uric acid; mag; phos  # weight loss: Discussed regarding protein intake/muscle health.  # DISPOSITION: # follow up in 1 week-APP labs- cbc/cmp; LDH;hold tube; aranesp  Possible D-2 1 unit PRBC  # follow up in 4  week-MD  labs- cbc/cmp; LDH;hold tube; aranesp  Possible D-2 1 unit PRBC- Dr.B   Addendum

## 2023-09-22 NOTE — Progress Notes (Signed)
 Clinical Pharmacist Practitioner [facility]  [phone] [phone]  Patient Care Team: Alla Amis, MD as PCP - General (Family Medicine) Rennie Cindy SAUNDERS, MD as Consulting Physician (Oncology)   Name of the patient: Amanda Rose  [id number]  1932-02-23   Date of visit: 09/22/23  HPI: Patient is a 89 y.o. female with chronic myelomonocytic leukemia worrisome for a myeloid proliferative/myelodysplastic syndrome. Planned treatment with Inqovi  (cedazuridine /decitabine ) to start on 09/22/23  Reason for Consult: Inqovi  oral chemotherapy education.   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)    Hypothyroidism    Lower extremity weakness    Pre-diabetes    Thyroid disease    Weakness of both legs     HEMATOLOGY/ONCOLOGY HISTORY:  Oncology History Overview Note  April 2023- BONE MARROW, ASPIRATE, CLOT, CORE:  -  Hypercellular marrow with myeloid hyperplasia and monocytosis  -  See comment and microscopic description below   PERIPHERAL BLOOD:  -  Macrocytic anemia and monocytosis  -  See complete blood cell count   COMMENT:   The findings in the marrow are 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  MEDICATIONS:  Current Outpatient Medications  Medication Sig Dispense Refill   allopurinol  (ZYLOPRIM ) 100 MG tablet Take 1 tablet (100 mg total) by mouth 2 (two) times daily. 60 tablet 3   Cholecalciferol (VITAMIN D-3) 125 MCG (5000  UT) TABS Take 5,000 Units by mouth in the morning.     Cyanocobalamin  (VITAMIN B-12) 5000 MCG SUBL Take 5,000 mcg by mouth in the morning.     decitabine -cedazuridine  (INQOVI ) 35-100 MG oral tablet Take 1 tablet by mouth daily. Take for 5 days, hold for 23d, [name], at least 2 hr before or after food 5 tablet 0   levothyroxine (SYNTHROID) 100 MCG tablet Take 100 mcg by mouth daily before breakfast.     Magnesium 100 MG CAPS Take by mouth.     Multiple Vitamin (MULTIVITAMIN WITH MINERALS) TABS tablet Take 1 tablet by mouth in the morning. Equate Complete Multivitamin     ondansetron  (ZOFRAN -ODT) 4 MG disintegrating tablet Take 1 tablet (4 mg total) by mouth every 8 (eight) hours as needed. 90 tablet 1   oxyCODONE  (ROXICODONE ) 5 MG immediate release tablet Take 1 tablet (5 mg total) by mouth every 8 (eight) hours as needed for severe pain (pain score 7-10). (Patient not taking: Reported on 09/22/2023) 45 tablet 0   No current facility-administered medications for this visit.    VITAL SIGNS: There were no vitals taken for this visit. There were no vitals filed for this visit.  Estimated body mass index is 22.02 kg/m as calculated from the following:   Height as of 08/24/23: 4' 11 (1.499 m).   Weight as of an earlier encounter on 09/22/23: 49.4 kg (109 lb).  LABS: CBC:    Component Value Date/Time   WBC 76.1 (HH) 09/22/2023 1242   WBC 30.5 (H) 03/26/2023 1442   HGB 7.6 (L) 09/22/2023 1242   HCT 22.8 (  L) 09/22/2023 1242   PLT 173 09/22/2023 1242   MCV 100.9 (H) 09/22/2023 1242   NEUTROABS 26.9 (H) 09/22/2023 1242   LYMPHSABS 7.9 (H) 09/22/2023 1242   MONOABS 36.2 (H) 09/22/2023 1242   EOSABS 1.0 (H) 09/22/2023 1242   BASOSABS 0.1 09/22/2023 1242   Comprehensive Metabolic Panel:    Component Value Date/Time   NA 136 09/22/2023 1243   K 3.6 09/22/2023 1243   CL 103 09/22/2023 1243   CO2 23 09/22/2023 1243   BUN 47 (H) 09/22/2023 1243    CREATININE 1.44 (H) 09/22/2023 1243   GLUCOSE 155 (H) 09/22/2023 1243   CALCIUM 8.6 (L) 09/22/2023 1243   AST 20 09/22/2023 1243   ALT 13 09/22/2023 1243   ALKPHOS 74 09/22/2023 1243   BILITOT 0.4 09/22/2023 1243   PROT 6.9 09/22/2023 1243   ALBUMIN 3.7 09/22/2023 1243     Present during today's visit: patient and her daughter Recardo Pickerel plan: pt will start Inqovi  today 09/22/23   Patient Education I spoke with patient for overview of new oral chemotherapy medication: Inqovi    Administration: Counseled patient on administration, dosing, side effects, monitoring, drug-food interactions, safe handling, storage, and disposal. Patient will take 1 tablet by mouth daily. Take for 5 days, hold for 23d, [name], at least 2 hr before or after food .  Side Effects: Side effects include but not limited to: nausea, diarrhea or constipation, fatigue, rash/itchy skin, mouth sores, decreased wbc/hgb/plt.   Rash: patient knows to call the office if she has a rash with treatment Diarrhea: patient will use loperamide as needed and call the office if she is having 4 or more loose stools per day Nausea: patient reports having ondansetron  at home to use as needed Mouth sores: patient knows an rx for magic mouthwash can be sent in if needed  Drug-drug Interactions (DDI): No current DDIs with Inqovi   Adherence: After discussion with patient no patient barriers to medication adherence identified.  Reviewed with patient importance of keeping a medication schedule and plan for any missed doses.  Ms. Rane voiced understanding and appreciation. All questions answered. Medication handout provided.  Provided patient with Oral Chemotherapy Navigation Clinic phone number. Patient knows to call the office with questions or concerns. Oral Chemotherapy Navigation Clinic will continue to follow.  Patient expressed understanding and was in agreement with this plan. She also  understands that She can call clinic at any time with any questions, concerns, or complaints.   Medication Access Issues: No issue, pt fills at [facility] (Specialty)  Follow-up plan: RTC as scheduled  Thank you for allowing me to participate in the care of this patient.   Time Total: 20 mins  Visit consisted of counseling and education on dealing with issues of symptom management in the setting of serious and potentially life-threatening illness.Greater than 50%  of this time was spent counseling and coordinating care related to the above assessment and plan.  Signed by: Timmie Dugue N. Shirlene Andaya, PharmD, NEILA, CPP Hematology/Oncology Clinical Pharmacist Practitioner [facility] [phone]  09/22/2023 3:34 PM

## 2023-09-22 NOTE — Progress Notes (Signed)
 Received call from Kim in lab with critical WBC 76.1. Read back. Dr Donneta Romberg notified.

## 2023-09-22 NOTE — Progress Notes (Signed)
 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, white count elevated.     RBC (Red Blood Cell Count) 4.04 - 5.48 10^6/uL 3.02 Low     Hemoglobin 12.0 - 15.0 gm/dL 89.4 Low     Hematocrit 35.0 - 47.0 % 31.9 Low     MCV (Mean Corpuscular Volume) 80.0 - 100.0 fl 105.6 High     MCH (Mean Corpuscular Hemoglobin) 27.0 - 31.2 pg 34.8 High     MCHC (Mean Corpuscular Hemoglobin Concentration) 32.0 - 36.0 gm/dL 67.0    Platelet Count 150 - 450 10^3/uL 180    RDW-CV (Red Cell Distribution Width) 11.6 - 14.8 % 14.5    MPV (Mean Platelet Volume) 9.4 - 12.4 fl 9.1 Low     Neutrophils 1.50 - 7.80 10^3/uL 16.28 High     Lymphocytes 1.00 - 3.60 10^3/uL 4.61 High     Monocytes 0.00 - 1.50 10^3/uL 16.92 High     Eosinophils 0.00 - 0.55 10^3/uL 0.48    Basophils 0.00 - 0.09 10^3/uL 0.07    Neutrophil % 32.0 - 70.0 % 41.2    Lymphocyte % 10.0 - 50.0 % 11.7    Monocyte % 4.0 - 13.0 % 42.8 High   See manual diff and path review.   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   Accompanied by daughter.   Amanda Rose 89 y.o.  female pleasant chronic myelomonocytic leukemia  currently on surveillance is here for follow-up/ and proceed with chemo- INQOVI .   Last week's chemo was held sec to right upper quadrant pain. Currently resolved.   Pt has gained weight this time. Appetite is not so great.    Intermittent nausea. With vomiting x2 episodes in last 2 weeks.   Review of Systems  Constitutional:  Positive for malaise/fatigue. Negative for chills, [name], palpitations, orthopnea and leg swelling.  Gastrointestinal:  Positive for abdominal pain. Negative for blood in  stool, constipation, diarrhea, heartburn, melena, nausea and vomiting.  Genitourinary:  Negative for dysuria, frequency and urgency.  Musculoskeletal:  Positive for back pain. Negative for joint pain.  Skin: Negative.  Negative for itching and rash.  Neurological:  Negative for dizziness, tingling, focal weakness, weakness and headaches.  Endo/Heme/Allergies:  Does not bruise/bleed easily.  Psychiatric/Behavioral:  Negative for depression. The patient is not nervous/anxious and does not have insomnia.       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)    Hypothyroidism     Lower extremity weakness    Pre-diabetes    Thyroid disease    Weakness of both legs     PAST SURGICAL HISTORY :   Past Surgical History:  Procedure Laterality Date   IR KYPHO EA ADDL LEVEL THORACIC OR LUMBAR  12/17/2021   IR KYPHO THORACIC WITH BONE BIOPSY  12/17/2021   IR RADIOLOGIST EVAL & MGMT  12/09/2021   KYPHOPLASTY N/A 04/05/2021   Procedure: T10 and L2 KYPHOPLASTY;  Surgeon: Kathlynn Sharper, MD;  [name];  Service: Orthopedics;  [name];    FAMILY HISTORY :   Family History  Problem Relation Age of Onset   Cancer Father        unknown    SOCIAL HISTORY:   Social History   Tobacco Use   Smoking status: Never   Smokeless tobacco: Never  Vaping Use   Vaping status: Never Used  Substance Use Topics   Alcohol use: Never   Drug use: Never    ALLERGIES:  is allergic to other.  MEDICATIONS:  Current Outpatient Medications  Medication Sig Dispense Refill   allopurinol  (ZYLOPRIM ) 100 MG tablet Take 1 tablet (100 mg total) by mouth 2 (two) times daily. 60 tablet 3   Cholecalciferol (VITAMIN D-3) 125 MCG (5000 UT) TABS Take 5,000 Units by mouth in the morning.     Cyanocobalamin  (VITAMIN B-12) 5000 MCG SUBL Take 5,000 mcg by mouth in the morning.     decitabine -cedazuridine  (INQOVI ) 35-100 MG oral tablet Take 1 tablet by mouth daily. Take for 5 days, hold for 23d, [name], at least 2 hr before or after food 5 tablet 0   levothyroxine (SYNTHROID) 100 MCG tablet Take 100 mcg by mouth daily before breakfast.     Magnesium 100 MG CAPS Take by mouth.     Multiple Vitamin (MULTIVITAMIN WITH MINERALS) TABS tablet Take 1 tablet by mouth in the morning. Equate Complete Multivitamin     ondansetron  (ZOFRAN -ODT) 4 MG disintegrating tablet Take 1 tablet (4 mg total) by mouth every 8 (eight) hours as needed. 90 tablet 1   oxyCODONE  (ROXICODONE ) 5 MG immediate release tablet Take 1 tablet (5 mg total) by mouth every 8 (eight) hours as  needed for severe pain (pain score 7-10). (Patient not taking: Reported on 09/22/2023) 45 tablet 0   No current facility-administered medications for this visit.    PHYSICAL EXAMINATION:  BP (!) 130/55 (BP Location: Left Arm, Patient Position: Sitting, Cuff Size: Normal)   Pulse 100   Temp 99 F (37.2 C) (Tympanic)   Resp 14   Wt 109 lb (49.4 kg)   SpO2 98%   BMI 22.02 kg/m   Filed Weights   09/22/23 1302  Weight: 109 lb (49.4 kg)     No skin rash.  No obvious right upper quadrant tenderness.   Physical  Exam Vitals and nursing note reviewed.  HENT:     Head: Normocephalic and atraumatic.     Mouth/Throat:     Pharynx: Oropharynx is clear.  Eyes:     Extraocular Movements: Extraocular movements intact.     Pupils: Pupils are equal, round, and reactive to light.  Cardiovascular:     Rate and Rhythm: Normal rate and regular rhythm.  Pulmonary:     Comments: Decreased breath sounds bilaterally.  Abdominal:     Palpations: Abdomen is soft.  Musculoskeletal:        General: Normal range of motion.     Cervical back: Normal range of motion.  Skin:    General: Skin is warm.  Neurological:     General: No focal deficit present.     Mental Status: She is alert and oriented to person, place, and time.  Psychiatric:        Behavior: Behavior normal.        Judgment: Judgment normal.        LABORATORY DATA:  I have reviewed the data as listed    Component Value Date/Time   NA 136 09/22/2023 1243   K 3.6 09/22/2023 1243   CL 103 09/22/2023 1243   CO2 23 09/22/2023 1243   GLUCOSE 155 (H) 09/22/2023 1243   BUN 47 (H) 09/22/2023 1243   CREATININE 1.44 (H) 09/22/2023 1243   CALCIUM 8.6 (L) 09/22/2023 1243   PROT 6.9 09/22/2023 1243   ALBUMIN 3.7 09/22/2023 1243   AST 20 09/22/2023 1243   ALT 13 09/22/2023 1243   ALKPHOS 74 09/22/2023 1243   BILITOT 0.4 09/22/2023 1243   GFRNONAA 35 (L) 09/22/2023 1243   GFRAA >60 12/03/2017 1003    No results found for:  SPEP, UPEP  Lab Results  Component Value Date   WBC 76.1 (HH) 09/22/2023   NEUTROABS 26.9 (H) 09/22/2023   HGB 7.6 (L) 09/22/2023   HCT 22.8 (L) 09/22/2023   MCV 100.9 (H) 09/22/2023   PLT 173 09/22/2023      Chemistry      Component Value Date/Time   NA 136 09/22/2023 1243   K 3.6 09/22/2023 1243   CL 103 09/22/2023 1243   CO2 23 09/22/2023 1243   BUN 47 (H) 09/22/2023 1243   CREATININE 1.44 (H) 09/22/2023 1243      Component Value Date/Time   CALCIUM 8.6 (L) 09/22/2023 1243   ALKPHOS 74 09/22/2023 1243   AST 20 09/22/2023 1243   ALT 13 09/22/2023 1243   BILITOT 0.4 09/22/2023 1243       RADIOGRAPHIC STUDIES: I have personally reviewed the radiological images as listed and agreed with the findings in the report. No results found.    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  NEAGTIVE for JAK2 mutation; MPL; CALR mutation; PDGRA-NEGATIVE.   # Reviewed that NOV white count rising 50-60 -RISING ; Hb- 7.8-  platelets -167  Patient continues to be fairly asymptomatic except for ongoing fatigue.  Patient family interested in proceeding with  INQOVI  [po decitabine ]-pt scott reluctantly agrees to  venofer/aranesp  at this time.  Also discussed regarding blood transfusion-if hemoglobin less than 8 at next visit.   # Right upper quadrant pain- chest x-ray: Negative for any acute process- resolved.   # back pain/spasm [Dr.Morales]- a/p  epidural- no improvement- currently on prn oxycodone - stable.   # GERD/reflux-Recommend NEXIUM over the counter- in AM prior to breakfast/Dinner. Continue TUMS [over the counter] prn.  # Depression: Celexa  x1 - noted to have dizzy spells. Declined  Remeron  15 mg nightly.    # CKD stage III [43]- [place] - APRIl 2023- NO hydronephrosis.  Risk of tumor lysis: Start patient on allopurinol  100 g twice a day. Will check Uric acid; mag; phos  # weight loss: Discussed regarding protein intake/muscle health.  # DISPOSITION: # follow up in 1 week-APP labs- cbc/cmp; LDH;hold tube; aranesp  Possible D-2 1 unit PRBC  # follow up in 4  week-MD  labs- cbc/cmp; LDH;hold tube; aranesp  Possible D-2 1 unit PRBC- Dr.B   Addendum      Orders Placed This Encounter  Procedures   CBC with Differential (Cancer Center Only)    Standing Status:   Future    Expected Date:   10/20/2023    Expiration Date:   09/21/2024   CMP (Cancer Center only)    Standing Status:   Future    Expected Date:   10/20/2023    Expiration Date:   09/21/2024   Lactate dehydrogenase    Standing Status:   Future    Expected Date:   10/20/2023    Expiration Date:   09/21/2024   CBC with Differential (Cancer Center Only)    Standing Status:   Future    Expected Date:   09/29/2023    Expiration Date:   09/21/2024   CMP (Cancer Center only)    Standing Status:   Future    Expected Date:   09/29/2023    Expiration Date:   09/21/2024   Lactate dehydrogenase    Standing Status:   Future    Expected Date:   09/29/2023    Expiration Date:   09/21/2024   ABO/Rh    Standing Status:   Future    Expected Date:   09/29/2023    Expiration Date:   09/21/2024   Sample to Blood Bank    Standing Status:   Future    Expected Date:   10/20/2023    Expiration Date:   09/21/2024   Sample to Blood Bank    Standing Status:   Future    Expected Date:   09/29/2023    Expiration Date:   09/21/2024   All questions were answered. The patient knows to call the clinic with any problems, questions or concerns.      Amanda JONELLE Joe, MD 09/22/2023 2:04 PM

## 2023-09-22 NOTE — Progress Notes (Signed)
 Patient had a DG on 09/14/2023. She vomited yesterday and once today.

## 2023-09-24 ENCOUNTER — Other Ambulatory Visit: Payer: Self-pay | Admitting: Internal Medicine

## 2023-09-25 ENCOUNTER — Telehealth: Payer: Self-pay | Admitting: Pharmacist

## 2023-09-25 NOTE — Telephone Encounter (Signed)
Pt daughter is trying to reach [employer]. The phone number is correct for Amil Amen.

## 2023-09-25 NOTE — Telephone Encounter (Signed)
Return call to patient's daughter Amil Amen. She had a few additional Inqovi medication questions. All questions answered for Amil Amen.

## 2023-09-29 ENCOUNTER — Encounter: Payer: Self-pay | Admitting: Nurse Practitioner

## 2023-09-29 ENCOUNTER — Inpatient Hospital Stay (HOSPITAL_BASED_OUTPATIENT_CLINIC_OR_DEPARTMENT_OTHER): Payer: Medicare HMO | Admitting: Nurse Practitioner

## 2023-09-29 ENCOUNTER — Other Ambulatory Visit: Payer: Self-pay

## 2023-09-29 ENCOUNTER — Inpatient Hospital Stay: Payer: Medicare HMO

## 2023-09-29 ENCOUNTER — Other Ambulatory Visit: Payer: Self-pay | Admitting: *Deleted

## 2023-09-29 VITALS — BP 133/69 | HR 98 | Temp 97.8°F | Wt 103.9 lb

## 2023-09-29 DIAGNOSIS — D649 Anemia, unspecified: Secondary | ICD-10-CM

## 2023-09-29 DIAGNOSIS — C9310 Chronic myelomonocytic leukemia not having achieved remission: Secondary | ICD-10-CM

## 2023-09-29 LAB — CMP (CANCER CENTER ONLY)
ALT: 12 U/L (ref 0–44)
AST: 22 U/L (ref 15–41)
Albumin: 3.7 g/dL (ref 3.5–5.0)
Alkaline Phosphatase: 70 U/L (ref 38–126)
Anion gap: 11 (ref 5–15)
BUN: 47 mg/dL — ABNORMAL HIGH (ref 8–23)
CO2: 21 mmol/L — ABNORMAL LOW (ref 22–32)
Calcium: 8.6 mg/dL — ABNORMAL LOW (ref 8.9–10.3)
Chloride: 101 mmol/L (ref 98–111)
Creatinine: 1.68 mg/dL — ABNORMAL HIGH (ref 0.44–1.00)
GFR, Estimated: 29 mL/min — ABNORMAL LOW (ref >60)
Glucose, Bld: 179 mg/dL — ABNORMAL HIGH (ref 70–99)
Potassium: 4.2 mmol/L (ref 3.5–5.1)
Sodium: 133 mmol/L — ABNORMAL LOW (ref 135–145)
Total Bilirubin: 0.5 mg/dL (ref 0.0–1.2)
Total Protein: 7.1 g/dL (ref 6.5–8.1)

## 2023-09-29 LAB — CBC WITH DIFFERENTIAL (CANCER CENTER ONLY)
Abs Immature Granulocytes: 0.38 10*3/uL — ABNORMAL HIGH (ref 0.00–0.07)
Basophils Absolute: 0.1 10*3/uL (ref 0.0–0.1)
Basophils Relative: 0 %
Eosinophils Absolute: 0.5 10*3/uL (ref 0.0–0.5)
Eosinophils Relative: 2 %
HCT: 22.2 % — ABNORMAL LOW (ref 36.0–46.0)
Hemoglobin: 7.3 g/dL — ABNORMAL LOW (ref 12.0–15.0)
Immature Granulocytes: 2 %
Lymphocytes Relative: 8 %
Lymphs Abs: 1.4 10*3/uL (ref 0.7–4.0)
MCH: 33 pg (ref 26.0–34.0)
MCHC: 32.9 g/dL (ref 30.0–36.0)
MCV: 100.5 fL — ABNORMAL HIGH (ref 80.0–100.0)
Monocytes Absolute: 8.6 10*3/uL — ABNORMAL HIGH (ref 0.1–1.0)
Monocytes Relative: 46 %
Neutro Abs: 8 10*3/uL — ABNORMAL HIGH (ref 1.7–7.7)
Neutrophils Relative %: 42 %
Platelet Count: 131 10*3/uL — ABNORMAL LOW (ref 150–400)
RBC: 2.21 MIL/uL — ABNORMAL LOW (ref 3.87–5.11)
RDW: 13.8 % (ref 11.5–15.5)
Smear Review: NORMAL
WBC Count: 18.9 10*3/uL — ABNORMAL HIGH (ref 4.0–10.5)
nRBC: 0 % (ref 0.0–0.2)

## 2023-09-29 LAB — MAGNESIUM: Magnesium: 2.2 mg/dL (ref 1.7–2.4)

## 2023-09-29 LAB — PHOSPHORUS: Phosphorus: 4.4 mg/dL (ref 2.5–4.6)

## 2023-09-29 LAB — LACTATE DEHYDROGENASE: LDH: 217 U/L — ABNORMAL HIGH (ref 98–192)

## 2023-09-29 LAB — PREPARE RBC (CROSSMATCH)

## 2023-09-29 LAB — URIC ACID: Uric Acid, Serum: 4.8 mg/dL (ref 2.5–7.1)

## 2023-09-29 MED ORDER — DARBEPOETIN ALFA 200 MCG/0.4ML IJ SOSY
200.0000 ug | PREFILLED_SYRINGE | Freq: Once | INTRAMUSCULAR | Status: AC
  Administered 2023-09-29: 200 ug via SUBCUTANEOUS
  Filled 2023-09-29: qty 0.4

## 2023-09-29 NOTE — Progress Notes (Signed)
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, 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 Accompanied by daughter.   Amanda Rose 89 y.o. female pleasant chronic myelomonocytic leukemia, currently on surveillance is here for follow-up after starting inqovi on 09/22/23. Weight is down and appetite has reduced. She feels generally fatigued. Denies constipation, [name], [name], palpitations, orthopnea and leg swelling.  Gastrointestinal:  Positive for nausea. Negative for abdominal pain, blood in stool, constipation, diarrhea, heartburn, melena and vomiting.  Genitourinary:  Negative for dysuria, frequency and urgency.  Musculoskeletal:  Positive for back pain.  Negative for joint pain.  Skin: Negative.  Negative for itching and rash.  Neurological:  Negative for dizziness, tingling, focal weakness, weakness and headaches.  Endo/Heme/Allergies:  Does not bruise/bleed easily.  Psychiatric/Behavioral:  Negative for depression. The patient is not nervous/anxious and does not have insomnia.      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)    Hypothyroidism    Lower extremity weakness    Pre-diabetes    Thyroid disease    Weakness  of both legs     PAST SURGICAL HISTORY :   Past Surgical History:  Procedure Laterality Date   IR KYPHO EA ADDL LEVEL THORACIC OR LUMBAR  12/17/2021   IR KYPHO THORACIC WITH BONE BIOPSY  12/17/2021   IR RADIOLOGIST EVAL & MGMT  12/09/2021   KYPHOPLASTY N/A 04/05/2021   Procedure: T10 and L2 KYPHOPLASTY;  [name], MD;  [name];  Service: Orthopedics;  [name];    [name],[name],[name], hold for 23d, [name], at least 2 hr before or after food 5 tablet 0   levothyroxine (SYNTHROID) 100 MCG tablet Take 100 mcg by mouth daily before breakfast.     Magnesium 100 MG CAPS Take by mouth.     Multiple Vitamin (MULTIVITAMIN WITH MINERALS) TABS tablet Take 1 tablet by mouth in the morning. Equate Complete Multivitamin     ondansetron (ZOFRAN-ODT) 4 MG disintegrating tablet Take 1 tablet (4 mg total) by mouth every 8 (eight) hours as needed. 90 tablet 1   oxyCODONE (ROXICODONE) 5 MG immediate release tablet Take 1 tablet (5 mg total) by mouth every 8 (eight) hours as needed for severe pain (pain score 7-10). (Patient not taking: Reported on  09/22/2023) 45 tablet 0   No current facility-administered medications for this visit.    PHYSICAL EXAMINATION:  BP 133/69 (BP Location: Left Arm, Patient Position: Sitting, [name] 20.99 kg/m   Filed Weights   09/29/23 1329  Weight: 103 lb 14.4 oz (47.1 kg)   Physical Exam Vitals reviewed.  HENT:     Head: Normocephalic and atraumatic.     Mouth/Throat:     Pharynx: Oropharynx is clear.  Cardiovascular:     Rate and Rhythm: Normal rate and regular rhythm.  Pulmonary:     Comments: Decreased breath sounds bilaterally.  Abdominal:     General: There is no distension.     Palpations: Abdomen is soft.  Skin:    General: Skin is warm.     Coloration: Skin is pale.  Neurological:     Mental Status: She is alert and oriented to person, place, and time.  Psychiatric:        Mood and Affect: Mood normal.        Behavior: Behavior normal.    LABORATORY DATA:  I have reviewed the data as listed    Component Value Date/Time   NA 136 09/22/2023 1243   K 3.6 09/22/2023 1243   CL 103 09/22/2023 1243   CO2 23 09/22/2023 1243   GLUCOSE 155 (H) 09/22/2023 1243   BUN 47 (H) 09/22/2023 1243   CREATININE 1.44 (H) 09/22/2023 1243   CALCIUM 8.6 (L) 09/22/2023 1243   PROT 6.9 09/22/2023 1243   ALBUMIN 3.7 09/22/2023 1243   AST 20 09/22/2023 1243   ALT 13 09/22/2023 1243   ALKPHOS 74 09/22/2023 1243   BILITOT 0.4 09/22/2023 1243   GFRNONAA 35 (L) 09/22/2023 1243   GFRAA >60 12/03/2017 1003   Lab Results  Component Value Date   WBC 18.9 (H) 09/29/2023   NEUTROABS PENDING 09/29/2023   HGB 7.3 (L) 09/29/2023   HCT 22.2 (L) 09/29/2023   MCV 100.5 (H) 09/29/2023   PLT 131 (L) 09/29/2023    RADIOGRAPHIC STUDIES: I have personally reviewed the radiological images as listed and agreed with the findings in the report. No results found.    ASSESSMENT & PLAN:   # Chronic  myelomonocytic leukemia- April 2023-bone marrow biopsy: Hypercellular marrow with myeloid hyperplasia and monocytosis worrisome for a myeloid  proliferative/myelodysplastic syndrome, specifically chronic myelomonocytic leukemia; myeloid panel. NEGATIVE for JAK2 mutation; MPL; CALR mutation; PDGRA- NEGATIVE. Nov white count rising 50-60, Hmg 7.8. Plt 167. Fatigued but otherwise fairly asymptomatic. Started Inqovi (PO Decitabine) on 09/22/23.   # Symptomatic Anemia- Hmg 7.3. Reviewed role for aranesp vs iv iron vs transfusion. Reviewed mechanism of action of ESAs including potential risks of VTE however, these risks were primarily when administered to target a hemoglobin > 11. Therefore, we use the lowest sufficient dose to reduce need for transfusions. Reviewed that IV iron may be useful to optimize iron stores which is a building block for RBCs. Hold off on iv iron for now. Risks of transfusion were discussed with patient including, but are not limited to: fluid overload, febrile transfusion reaction, transfusion reaction, Hepatitis B, Hepatitis C, HIV, as well as other currently unknown infectious complications. She is in agreement to proceed. Will plan for aranesp 200 mcg today and 1 unit of irradiated pRBCs tomorrow. Patient will sign a written consent prior to the procedure. Consider transfusion for hemoglobin < 8.    # Right upper quadrant pain- chest x-ray: Negative for any acute process- resolved.    # back pain/spasm [Dr.Morales]- a/p epidural- no improvement- currently on prn oxycodone- stable.    # GERD/reflux-Recommend NEXIUM over the counter- in AM prior to breakfast/Dinner. Continue TUMS [over the counter] prn.   # Depression: Celexa x1 - noted to have dizzy spells. Declined Remeron 15 mg nightly.     # CKD stage III [43]- [place]- APRIl 2023- NO hydronephrosis.  Risk of tumor lysis: continue allopurinol 100 g twice a day. Uric acid normal, mag 2.2, phos 4.4.    # weight loss: Discussed  regarding protein  intake/muscle health.   # DISPOSITION: Aranesp today Blood tomorrow F/u with Dr Donneta Romberg as scheduled- la    No problem-specific Assessment & Plan notes found for this encounter.  No orders of the defined types were placed in this encounter.  All questions were answered. The patient knows to call the clinic with any problems, questions or concerns.     Alinda Dooms, NP 09/29/2023

## 2023-09-30 ENCOUNTER — Encounter: Payer: Self-pay | Admitting: Internal Medicine

## 2023-09-30 ENCOUNTER — Inpatient Hospital Stay: Payer: Medicare HMO

## 2023-09-30 DIAGNOSIS — C9310 Chronic myelomonocytic leukemia not having achieved remission: Secondary | ICD-10-CM | POA: Diagnosis not present

## 2023-09-30 DIAGNOSIS — D649 Anemia, unspecified: Secondary | ICD-10-CM

## 2023-09-30 MED ORDER — SODIUM CHLORIDE 0.9% IV SOLUTION
250.0000 mL | INTRAVENOUS | Status: DC
  Administered 2023-09-30: 100 mL via INTRAVENOUS
  Filled 2023-09-30: qty 250

## 2023-09-30 MED ORDER — SODIUM CHLORIDE 0.9% FLUSH
10.0000 mL | INTRAVENOUS | Status: DC | PRN
  Filled 2023-09-30: qty 10

## 2023-09-30 NOTE — Patient Instructions (Signed)
Blood Transfusion, Adult A blood transfusion is a procedure in which you receive blood through an IV tube. You may need this procedure because of: A bleeding disorder. An illness. An injury. A surgery. The blood may come from someone else (a donor). You may also be able to donate blood for yourself before a surgery. The blood given in a transfusion may be made up of different types of cells. You may get: Red blood cells. These carry oxygen to the cells in the body. Platelets. These help your blood to clot. Plasma. This is the liquid part of your blood. It carries proteins and other substances through the body. White blood cells. These help you fight infections. If you have a clotting disorder, you may also get other types of blood products. Depending on the type of blood product, this procedure may take 1-4 hours to complete. Tell your doctor about: Any bleeding problems you have. Any reactions you have had during a blood transfusion in the past. Any allergies you have. All medicines you are taking, including vitamins, herbs, eye drops, creams, and over-the-counter medicines. Any surgeries you have had. Any medical conditions you have. Whether you are pregnant or may be pregnant. What are the risks? Talk with your health care provider about risks. The most common problems include: A mild allergic reaction. This includes red, swollen areas of skin (hives) and itching. Fever or chills. This may be the body's response to new blood cells received. This may happen during or up to 4 hours after the transfusion. More serious problems may include: A serious allergic reaction. This includes breathing trouble or swelling around the face and lips. Too much fluid in the lungs. This may cause breathing problems. Lung injury. This causes breathing trouble and low oxygen in the blood. This can happen within hours of the transfusion or days later. Too much iron. This can happen after getting many blood  transfusions over a period of time. An infection or virus passed through the blood. This is rare. Donated blood is carefully tested before it is given. Your body's defense system (immune system) trying to attack the new blood cells. This is rare. Symptoms may include fever, chills, nausea, low blood pressure, and low back or chest pain. Donated cells attacking healthy tissues. This is rare. What happens before the procedure? You will have a blood test to find out your blood type. The test also finds out what type of blood your body will accept and matches it to the donor type. If you are going to have a planned surgery, you may be able to donate your own blood. This may be done in case you need a transfusion. You will have your temperature, blood pressure, and pulse checked. You may receive medicine to help prevent an allergic reaction. This may be done if you have had a reaction to a transfusion before. This medicine may be given to you by mouth or through an IV tube. What happens during the procedure?  An IV tube will be put into one of your veins. The bag of blood will be attached to your IV tube. Then, the blood will enter through your vein. Your temperature, blood pressure, and pulse will be checked often. This is done to find early signs of a transfusion reaction. Tell your nurse right away if you have any of these symptoms: Shortness of breath or trouble breathing. Chest or back pain. Fever or chills. Red, swollen areas of skin or itching. If you have any signs   or symptoms of a reaction, your transfusion will be stopped. You may also be given medicine. When the transfusion is finished, your IV tube will be taken out. Pressure may be put on the IV site for a few minutes. A bandage (dressing) will be put on the IV site. The procedure may vary among doctors and hospitals. What happens after the procedure? You will be monitored until you leave the hospital or clinic. This includes  checking your temperature, blood pressure, pulse, breathing rate, and blood oxygen level. Your blood may be tested to see how you have responded to the transfusion. You may be warmed with fluids or blankets. This is done to keep the temperature of your body normal. If you have your procedure in an outpatient setting, you will be told whom to contact to report any reactions. Where to find more information Visit the [employer]: redcross.org Summary A blood transfusion is a procedure in which you receive blood through an IV tube. The blood you are given may be made up of different blood cells. You may receive red blood cells, platelets, plasma, or white blood cells. Your temperature, blood pressure, and pulse will be checked often. After the procedure, your blood may be tested to see how you have responded. This information is not intended to replace advice given to you by your health care provider. Make sure you discuss any questions you have with your health care provider. Document Revised: 11/22/2021 Document Reviewed: 11/22/2021 Elsevier Patient Education  2024 Elsevier Inc.  

## 2023-10-01 ENCOUNTER — Telehealth: Payer: Self-pay | Admitting: Nurse Practitioner

## 2023-10-01 DIAGNOSIS — C9310 Chronic myelomonocytic leukemia not having achieved remission: Secondary | ICD-10-CM

## 2023-10-01 NOTE — Telephone Encounter (Signed)
Spoke to [place] by phone to follow up after patient received her first blood transfusion and aranesp injection. She tolerated them both well without side effects. Hasn't noticed improvement in energy levels but feels at baseline. Reviewed treatment schedule for follow up. She'll see Dr Donneta Romberg as scheduled with labs next month for consideration of cycle 2.

## 2023-10-03 LAB — TYPE AND SCREEN
ABO/RH(D): O POS
Antibody Screen: NEGATIVE
Unit division: 0

## 2023-10-03 LAB — BPAM RBC
Blood Product Expiration Date: 202501282359
ISSUE DATE / TIME: 202501221053
Unit Type and Rh: 9500

## 2023-10-03 LAB — ABO/RH: ABO/RH(D): O POS

## 2023-10-05 ENCOUNTER — Telehealth: Payer: Self-pay | Admitting: *Deleted

## 2023-10-05 NOTE — Telephone Encounter (Signed)
The message said that they got referral but no rx. I spoke to [facility] and the pt has getting the inqovi through [facility] waited on phone for 15 min and I had to hang up because of the calls are backing up for othe pt's.

## 2023-10-07 ENCOUNTER — Ambulatory Visit: Payer: Medicare HMO | Admitting: Dermatology

## 2023-10-09 ENCOUNTER — Other Ambulatory Visit: Payer: Self-pay

## 2023-10-09 ENCOUNTER — Other Ambulatory Visit: Payer: Self-pay | Admitting: Internal Medicine

## 2023-10-09 DIAGNOSIS — C9310 Chronic myelomonocytic leukemia not having achieved remission: Secondary | ICD-10-CM

## 2023-10-09 MED ORDER — INQOVI 35-100 MG PO TABS
1.0000 | ORAL_TABLET | Freq: Every day | ORAL | 0 refills | Status: DC
  Filled 2023-10-12: qty 5, 28d supply, fill #0

## 2023-10-09 NOTE — Progress Notes (Signed)
Specialty Pharmacy Ongoing Clinical Assessment Note  Amanda Rose is a 89 y.o. female who is being followed by the specialty pharmacy service for RxSp Oncology   Patient's specialty medication(s) reviewed today: Decitabine-Cedazuridine [name])   Missed doses in the last 4 weeks: 0   Patient/Caregiver did not have any additional questions or concerns.   Therapeutic benefit summary: Unable to assess   Adverse events/side effects summary: No adverse events/side effects   Patient's therapy is appropriate to: Continue    Goals Addressed             This Visit's Progress    Stabilization of disease       Patient is on track. Patient will be evaluated at upcoming provider appointment to assess progress         Follow up:  3 months  Bobette Mo Specialty Pharmacist

## 2023-10-09 NOTE — Progress Notes (Signed)
Specialty Pharmacy Refill Coordination Note  Amanda Rose is a 89 y.o. female who's daughter, Amanda Rose was contacted today regarding refills of specialty medication(s) Decitabine-Cedazuridine [name])   Patient requested Delivery   Delivery date: 10/15/23   Verified address: [address], [place], [place] [address]   Medication will be filled on 10/14/23. Pending Refill Request.   Next Cycle starts 10/19/23  Next follow-up appt is 10/20/23.

## 2023-10-12 ENCOUNTER — Other Ambulatory Visit: Payer: Self-pay

## 2023-10-13 ENCOUNTER — Other Ambulatory Visit: Payer: Medicare HMO

## 2023-10-13 ENCOUNTER — Other Ambulatory Visit: Payer: Self-pay

## 2023-10-14 ENCOUNTER — Other Ambulatory Visit: Payer: Self-pay

## 2023-10-16 ENCOUNTER — Other Ambulatory Visit: Payer: Self-pay

## 2023-10-20 ENCOUNTER — Encounter: Payer: Self-pay | Admitting: Internal Medicine

## 2023-10-20 ENCOUNTER — Ambulatory Visit: Payer: Medicare HMO | Admitting: Internal Medicine

## 2023-10-20 ENCOUNTER — Inpatient Hospital Stay: Payer: Medicare HMO | Attending: Internal Medicine

## 2023-10-20 ENCOUNTER — Ambulatory Visit: Payer: Medicare HMO

## 2023-10-20 ENCOUNTER — Inpatient Hospital Stay: Payer: Medicare HMO

## 2023-10-20 ENCOUNTER — Inpatient Hospital Stay (HOSPITAL_BASED_OUTPATIENT_CLINIC_OR_DEPARTMENT_OTHER): Payer: Medicare HMO | Admitting: Internal Medicine

## 2023-10-20 DIAGNOSIS — K219 Gastro-esophageal reflux disease without esophagitis: Secondary | ICD-10-CM | POA: Diagnosis not present

## 2023-10-20 DIAGNOSIS — M549 Dorsalgia, unspecified: Secondary | ICD-10-CM | POA: Insufficient documentation

## 2023-10-20 DIAGNOSIS — R42 Dizziness and giddiness: Secondary | ICD-10-CM | POA: Diagnosis not present

## 2023-10-20 DIAGNOSIS — Z79890 Hormone replacement therapy (postmenopausal): Secondary | ICD-10-CM | POA: Insufficient documentation

## 2023-10-20 DIAGNOSIS — R634 Abnormal weight loss: Secondary | ICD-10-CM | POA: Diagnosis not present

## 2023-10-20 DIAGNOSIS — R0981 Nasal congestion: Secondary | ICD-10-CM | POA: Insufficient documentation

## 2023-10-20 DIAGNOSIS — Z79899 Other long term (current) drug therapy: Secondary | ICD-10-CM | POA: Diagnosis not present

## 2023-10-20 DIAGNOSIS — E039 Hypothyroidism, unspecified: Secondary | ICD-10-CM | POA: Diagnosis not present

## 2023-10-20 DIAGNOSIS — R5383 Other fatigue: Secondary | ICD-10-CM | POA: Diagnosis not present

## 2023-10-20 DIAGNOSIS — C9310 Chronic myelomonocytic leukemia not having achieved remission: Secondary | ICD-10-CM | POA: Diagnosis not present

## 2023-10-20 DIAGNOSIS — Z809 Family history of malignant neoplasm, unspecified: Secondary | ICD-10-CM | POA: Insufficient documentation

## 2023-10-20 DIAGNOSIS — N183 Chronic kidney disease, stage 3 unspecified: Secondary | ICD-10-CM | POA: Diagnosis not present

## 2023-10-20 DIAGNOSIS — R110 Nausea: Secondary | ICD-10-CM | POA: Diagnosis not present

## 2023-10-20 DIAGNOSIS — D649 Anemia, unspecified: Secondary | ICD-10-CM

## 2023-10-20 DIAGNOSIS — R531 Weakness: Secondary | ICD-10-CM | POA: Diagnosis not present

## 2023-10-20 LAB — CBC WITH DIFFERENTIAL (CANCER CENTER ONLY)
Abs Immature Granulocytes: 7.38 10*3/uL — ABNORMAL HIGH (ref 0.00–0.07)
Basophils Absolute: 0.4 10*3/uL — ABNORMAL HIGH (ref 0.0–0.1)
Basophils Relative: 1 %
Eosinophils Absolute: 0.3 10*3/uL (ref 0.0–0.5)
Eosinophils Relative: 0 %
HCT: 26.7 % — ABNORMAL LOW (ref 36.0–46.0)
Hemoglobin: 8.5 g/dL — ABNORMAL LOW (ref 12.0–15.0)
Immature Granulocytes: 12 %
Lymphocytes Relative: 7 %
Lymphs Abs: 4.2 10*3/uL — ABNORMAL HIGH (ref 0.7–4.0)
MCH: 32.7 pg (ref 26.0–34.0)
MCHC: 31.8 g/dL (ref 30.0–36.0)
MCV: 102.7 fL — ABNORMAL HIGH (ref 80.0–100.0)
Monocytes Absolute: 28.9 10*3/uL — ABNORMAL HIGH (ref 0.1–1.0)
Monocytes Relative: 46 %
Neutro Abs: 21.3 10*3/uL — ABNORMAL HIGH (ref 1.7–7.7)
Neutrophils Relative %: 34 %
Platelet Count: 164 10*3/uL (ref 150–400)
RBC: 2.6 MIL/uL — ABNORMAL LOW (ref 3.87–5.11)
RDW: 18.4 % — ABNORMAL HIGH (ref 11.5–15.5)
Smear Review: NORMAL
WBC Count: 62.4 10*3/uL (ref 4.0–10.5)
nRBC: 0.1 % (ref 0.0–0.2)

## 2023-10-20 LAB — CMP (CANCER CENTER ONLY)
ALT: 13 U/L (ref 0–44)
AST: 29 U/L (ref 15–41)
Albumin: 3.8 g/dL (ref 3.5–5.0)
Alkaline Phosphatase: 88 U/L (ref 38–126)
Anion gap: 8 (ref 5–15)
BUN: 30 mg/dL — ABNORMAL HIGH (ref 8–23)
CO2: 22 mmol/L (ref 22–32)
Calcium: 8.8 mg/dL — ABNORMAL LOW (ref 8.9–10.3)
Chloride: 105 mmol/L (ref 98–111)
Creatinine: 1.42 mg/dL — ABNORMAL HIGH (ref 0.44–1.00)
GFR, Estimated: 35 mL/min — ABNORMAL LOW (ref >60)
Glucose, Bld: 110 mg/dL — ABNORMAL HIGH (ref 70–99)
Potassium: 4.2 mmol/L (ref 3.5–5.1)
Sodium: 135 mmol/L (ref 135–145)
Total Bilirubin: 0.8 mg/dL (ref 0.0–1.2)
Total Protein: 7.1 g/dL (ref 6.5–8.1)

## 2023-10-20 LAB — URIC ACID: Uric Acid, Serum: 4.6 mg/dL (ref 2.5–7.1)

## 2023-10-20 LAB — PHOSPHORUS: Phosphorus: 3.2 mg/dL (ref 2.5–4.6)

## 2023-10-20 LAB — LACTATE DEHYDROGENASE: LDH: 473 U/L — ABNORMAL HIGH (ref 98–192)

## 2023-10-20 LAB — SAMPLE TO BLOOD BANK

## 2023-10-20 LAB — MAGNESIUM: Magnesium: 1.9 mg/dL (ref 1.7–2.4)

## 2023-10-20 MED ORDER — DARBEPOETIN ALFA 200 MCG/0.4ML IJ SOSY
200.0000 ug | PREFILLED_SYRINGE | Freq: Once | INTRAMUSCULAR | Status: AC
  Administered 2023-10-20: 200 ug via SUBCUTANEOUS
  Filled 2023-10-20: qty 0.4

## 2023-10-20 NOTE — Progress Notes (Signed)
Received critical lab results from [facility] in lab. WBC 62.4 Read back. Dr B notified.

## 2023-10-20 NOTE — Progress Notes (Signed)
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, white count elevated.     RBC (Red Blood Cell Count) 4.04 - 5.48 10^6/uL 3.02 Low     Hemoglobin 12.0 - 15.0 gm/dL 16.1 Low     Hematocrit 35.0 - 47.0 % 31.9 Low     MCV (Mean Corpuscular Volume) 80.0 - 100.0 fl 105.6 High     MCH (Mean Corpuscular Hemoglobin) 27.0 - 31.2 pg 34.8 High     MCHC (Mean Corpuscular Hemoglobin Concentration) 32.0 - 36.0 gm/dL 09.6    Platelet Count 150 - 450 10^3/uL 180    RDW-CV (Red Cell Distribution Width) 11.6 - 14.8 % 14.5    MPV (Mean Platelet Volume) 9.4 - 12.4 fl 9.1 Low     Neutrophils 1.50 - 7.80 10^3/uL 16.28 High     Lymphocytes 1.00 - 3.60 10^3/uL 4.61 High     Monocytes 0.00 - 1.50 10^3/uL 16.92 High     Eosinophils 0.00 - 0.55 10^3/uL 0.48    Basophils 0.00 - 0.09 10^3/uL 0.07    Neutrophil % 32.0 - 70.0 % 41.2    Lymphocyte % 10.0 - 50.0 % 11.7    Monocyte % 4.0 - 13.0 % 42.8 High   See manual diff and path review.   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   Accompanied by daughter.   Amanda Rose 89 y.o.  female pleasant chronic myelomonocytic leukemia  currently on INQOVI is here to proceed with chemotherapy.   Pt has gained weight this time. Appetite is not so great.  Intermittent nausea. No diarrhea. No fever or chills.   Complains of nasal stuffiness.  No worsening cough.  No fever no chills.   Review of Systems  Constitutional:  Positive for malaise/fatigue. Negative for chills, [name], palpitations, orthopnea and leg swelling.  Gastrointestinal:  Negative for blood in stool, constipation, diarrhea, heartburn, melena, nausea and vomiting.  Genitourinary:  Negative  for dysuria, frequency and urgency.  Musculoskeletal:  Negative for joint pain.  Skin: Negative.  Negative for itching and rash.  Neurological:  Negative for dizziness, tingling, focal weakness, weakness and headaches.  Endo/Heme/Allergies:  Does not bruise/bleed easily.  Psychiatric/Behavioral:  Negative for depression. The patient is not nervous/anxious and does not have insomnia.       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)    Hypothyroidism    Lower extremity weakness    Pre-diabetes    Thyroid disease    Weakness of both legs     PAST SURGICAL  HISTORY :   Past Surgical History:  Procedure Laterality Date   IR KYPHO EA ADDL LEVEL THORACIC OR LUMBAR  12/17/2021   IR KYPHO THORACIC WITH BONE BIOPSY  12/17/2021   IR RADIOLOGIST EVAL & MGMT  12/09/2021   KYPHOPLASTY N/A 04/05/2021   Procedure: T10 and L2 KYPHOPLASTY;  [name], MD;  [name];  Service: Orthopedics;  [name];    FAMILY HISTORY :   Family History  Problem Relation Age of Onset   Cancer Father        unknown    SOCIAL HISTORY:   Social History   Tobacco Use   Smoking status: Never   Smokeless tobacco: Never  Vaping Use   Vaping status: Never Used  Substance Use Topics   Alcohol use: Never   Drug use: Never    ALLERGIES:  is allergic to other.  MEDICATIONS:  Current Outpatient Medications  Medication Sig Dispense Refill   allopurinol (ZYLOPRIM) 100 MG tablet TAKE 1 TABLET BY MOUTH TWICE A DAY 180 tablet 1   Cholecalciferol (VITAMIN D-3) 125 MCG (5000 UT) TABS Take 5,[name],[name], hold for 23d, [name], at least 2 hr before or after food 5 tablet 0   levothyroxine (SYNTHROID) 100 MCG tablet Take 100 mcg by mouth daily before breakfast.     Magnesium 100 MG CAPS Take by mouth.     Multiple Vitamin (MULTIVITAMIN WITH MINERALS) TABS tablet Take 1 tablet by mouth in the morning. Equate Complete Multivitamin     ondansetron (ZOFRAN-ODT) 4 MG disintegrating tablet Take 1 tablet (4 mg total) by mouth every 8 (eight) hours as needed. 90 tablet 1   No current facility-administered medications for this visit.    PHYSICAL EXAMINATION:  BP 136/68 (BP Location: Left Arm, Patient Position: Sitting, Cuff Size: Normal)   Pulse 88   Temp 98 F (36.7 C) (Tympanic)   Ht 4\' 11"  (1.499 m)   Wt  109 lb (49.4 kg)   SpO2 96%   BMI 22.02 kg/m   Filed Weights   10/20/23 0941  Weight: 109 lb (49.4 kg)     No skin rash.  No obvious right upper quadrant tenderness.   Physical Exam Vitals and nursing note reviewed.  HENT:     Head: Normocephalic and atraumatic.     Mouth/Throat:     Pharynx: Oropharynx is clear.  Eyes:     Extraocular Movements: Extraocular movements intact.     Pupils: Pupils are equal, round, and reactive to light.  Cardiovascular:  Rate and Rhythm: Normal rate and regular rhythm.  Pulmonary:     Comments: Decreased breath sounds bilaterally.  Abdominal:     Palpations: Abdomen is soft.  Musculoskeletal:        General: Normal range of motion.     Cervical back: Normal range of motion.  Skin:    General: Skin is warm.  Neurological:     General: No focal deficit present.     Mental Status: She is alert and oriented to person, place, and time.  Psychiatric:        Behavior: Behavior normal.        Judgment: Judgment normal.        LABORATORY DATA:  I have reviewed the data as listed    Component Value Date/Time   NA 135 10/20/2023 0927   K 4.2 10/20/2023 0927   CL 105 10/20/2023 0927   CO2 22 10/20/2023 0927   GLUCOSE 110 (H) 10/20/2023 0927   BUN 30 (H) 10/20/2023 0927   CREATININE 1.42 (H) 10/20/2023 0927   CALCIUM 8.8 (L) 10/20/2023 0927   PROT 7.1 10/20/2023 0927   ALBUMIN 3.8 10/20/2023 0927   AST 29 10/20/2023 0927   ALT 13 10/20/2023 0927   ALKPHOS 88 10/20/2023 0927   BILITOT 0.8 10/20/2023 0927   GFRNONAA 35 (L) 10/20/2023 0927   GFRAA >60 12/03/2017 1003    No results found for: "SPEP", "UPEP"  Lab Results  Component Value Date   WBC 62.4 (HH) 10/20/2023   NEUTROABS 21.3 (H) 10/20/2023   HGB 8.5 (L) 10/20/2023   HCT 26.7 (L) 10/20/2023   MCV 102.7 (H) 10/20/2023   PLT 164 10/20/2023      Chemistry      Component Value Date/Time   NA 135 10/20/2023 0927   K 4.2 10/20/2023 0927   CL 105 10/20/2023 0927    CO2 22 10/20/2023 0927   BUN 30 (H) 10/20/2023 0927   CREATININE 1.42 (H) 10/20/2023 0927      Component Value Date/Time   CALCIUM 8.8 (L) 10/20/2023 0927   ALKPHOS 88 10/20/2023 0927   AST 29 10/20/2023 0927   ALT 13 10/20/2023 0927   BILITOT 0.8 10/20/2023 0927       RADIOGRAPHIC STUDIES: I have personally reviewed the radiological images as listed and agreed with the findings in the report. No results found.    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  NEAGTIVE for JAK2 mutation; MPL; CALR mutation; PDGRA-NEGATIVE. JAN 2025-currently on INQOVI [po decitabine]- and aranesp   # Proceed with cycle #2 of  INQOVI [po decitabine]- and aranesp today as Hb 8.5;   # back pain/spasm [Dr.Morales]- a/p epidural- currently on prn oxycodone- stable.    # GERD/reflux-Recommend NEXIUM over the counter- in AM prior to breakfast/Dinner. Continue TUMS [over the counter] prn- stable.  # Depression: Celexa x1 - noted to have dizzy spells. Declined Remeron 15 mg nightly.    # CKD stage III [43]- [place]- APRIl 2023- NO hydronephrosis.  Risk of tumor lysis: Start patient on allopurinol 100 g twice a day.  GFR- 35- stable.   # weight loss: Discussed regarding protein intake/muscle health.  # Nasal stuffiness- likely allergies- recommend claritin; No concern for lower respiratory tract infection.  # DISPOSITION: # NO blood tomorrow # proceed with  Aranesp-  # follow up in 2  week-APP labs- cbc/cmp; LDH;hold tube; aranesp Possible D-2 1 unit PRBC # follow up in 4  week-MD  labs- cbc/cmp; LDH;hold tube; aranesp Possible D-2 1 unit PRBC- Dr.B       Orders Placed This Encounter  Procedures   CBC with  Differential (Cancer Center Only)    Standing Status:   Standing    Number of Occurrences:   2    Expiration Date:   10/19/2024   CMP (Cancer Center only)    Standing Status:   Standing    Number of Occurrences:   2    Expiration Date:   10/19/2024   Lactate dehydrogenase    Standing Status:   Standing    Number of Occurrences:   2    Expiration Date:   10/19/2024   Sample to Blood Bank    Standing Status:   Standing    Number of Occurrences:   2    Expiration Date:   10/19/2024   All questions were answered. The patient knows to call the clinic with any problems, questions or concerns.      Earna Coder, MD 10/20/2023 11:05 AM

## 2023-10-20 NOTE — Assessment & Plan Note (Addendum)
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  NEAGTIVE for JAK2 mutation; MPL; CALR mutation; PDGRA-NEGATIVE. JAN 2025-currently on INQOVI [po decitabine]- and aranesp   # Proceed with cycle #2 of  INQOVI [po decitabine]- and aranesp today as Hb 8.5;   # back pain/spasm [Dr.Morales]- a/p epidural- currently on prn oxycodone- stable.    # GERD/reflux-Recommend NEXIUM over the counter- in AM prior to breakfast/Dinner. Continue TUMS [over the counter] prn- stable.  # Depression: Celexa x1 - noted to have dizzy spells. Declined Remeron 15 mg nightly.    # CKD stage III [43]- [place]- APRIl 2023- NO hydronephrosis.  Risk of tumor lysis: Start patient on allopurinol 100 g twice a day.  GFR- 35- stable.   # weight loss: Discussed regarding protein intake/muscle health.  # Nasal stuffiness- likely allergies- recommend claritin; No concern for lower respiratory tract infection.  # DISPOSITION: # NO blood tomorrow # proceed with Aranesp-  # follow up in 2  week-APP labs- cbc/cmp; LDH;hold tube; aranesp Possible D-2 1 unit PRBC # follow up in 4  week-MD  labs- cbc/cmp; LDH;hold tube; aranesp Possible D-2 1 unit PRBC- Dr.B

## 2023-10-20 NOTE — Progress Notes (Signed)
Pt not feeling well today. Runny nose, headache, not sleeping well x2d days.  No appetite, 1 ensure per day.   2 times in the last week her ankles were swollen.  She is asking if she should start the next round of Inqovi since she is not feeling well? She is suppose to start today.

## 2023-10-21 ENCOUNTER — Inpatient Hospital Stay: Payer: Medicare HMO

## 2023-11-03 ENCOUNTER — Inpatient Hospital Stay (HOSPITAL_BASED_OUTPATIENT_CLINIC_OR_DEPARTMENT_OTHER): Payer: Medicare HMO | Admitting: Internal Medicine

## 2023-11-03 ENCOUNTER — Encounter: Payer: Self-pay | Admitting: Internal Medicine

## 2023-11-03 ENCOUNTER — Inpatient Hospital Stay: Payer: Medicare HMO

## 2023-11-03 VITALS — BP 122/54 | HR 94 | Temp 97.7°F | Ht 59.0 in | Wt 101.9 lb

## 2023-11-03 DIAGNOSIS — C9310 Chronic myelomonocytic leukemia not having achieved remission: Secondary | ICD-10-CM | POA: Diagnosis not present

## 2023-11-03 DIAGNOSIS — D649 Anemia, unspecified: Secondary | ICD-10-CM

## 2023-11-03 LAB — CBC WITH DIFFERENTIAL (CANCER CENTER ONLY)
Abs Immature Granulocytes: 0.24 10*3/uL — ABNORMAL HIGH (ref 0.00–0.07)
Basophils Absolute: 0 10*3/uL (ref 0.0–0.1)
Basophils Relative: 1 %
Eosinophils Absolute: 0.3 10*3/uL (ref 0.0–0.5)
Eosinophils Relative: 5 %
HCT: 25.8 % — ABNORMAL LOW (ref 36.0–46.0)
Hemoglobin: 8.3 g/dL — ABNORMAL LOW (ref 12.0–15.0)
Immature Granulocytes: 4 %
Lymphocytes Relative: 21 %
Lymphs Abs: 1.2 10*3/uL (ref 0.7–4.0)
MCH: 33.2 pg (ref 26.0–34.0)
MCHC: 32.2 g/dL (ref 30.0–36.0)
MCV: 103.2 fL — ABNORMAL HIGH (ref 80.0–100.0)
Monocytes Absolute: 1.5 10*3/uL — ABNORMAL HIGH (ref 0.1–1.0)
Monocytes Relative: 27 %
Neutro Abs: 2.3 10*3/uL (ref 1.7–7.7)
Neutrophils Relative %: 42 %
Platelet Count: 59 10*3/uL — ABNORMAL LOW (ref 150–400)
RBC: 2.5 MIL/uL — ABNORMAL LOW (ref 3.87–5.11)
RDW: 18.8 % — ABNORMAL HIGH (ref 11.5–15.5)
WBC Count: 5.6 10*3/uL (ref 4.0–10.5)
nRBC: 0 % (ref 0.0–0.2)

## 2023-11-03 LAB — CMP (CANCER CENTER ONLY)
ALT: 12 U/L (ref 0–44)
AST: 20 U/L (ref 15–41)
Albumin: 3.9 g/dL (ref 3.5–5.0)
Alkaline Phosphatase: 73 U/L (ref 38–126)
Anion gap: 7 (ref 5–15)
BUN: 48 mg/dL — ABNORMAL HIGH (ref 8–23)
CO2: 25 mmol/L (ref 22–32)
Calcium: 8.9 mg/dL (ref 8.9–10.3)
Chloride: 105 mmol/L (ref 98–111)
Creatinine: 1.57 mg/dL — ABNORMAL HIGH (ref 0.44–1.00)
GFR, Estimated: 31 mL/min — ABNORMAL LOW (ref >60)
Glucose, Bld: 107 mg/dL — ABNORMAL HIGH (ref 70–99)
Potassium: 4.1 mmol/L (ref 3.5–5.1)
Sodium: 137 mmol/L (ref 135–145)
Total Bilirubin: 0.9 mg/dL (ref 0.0–1.2)
Total Protein: 7.3 g/dL (ref 6.5–8.1)

## 2023-11-03 LAB — SAMPLE TO BLOOD BANK

## 2023-11-03 LAB — LACTATE DEHYDROGENASE: LDH: 238 U/L — ABNORMAL HIGH (ref 98–192)

## 2023-11-03 MED ORDER — DARBEPOETIN ALFA 200 MCG/0.4ML IJ SOSY
200.0000 ug | PREFILLED_SYRINGE | Freq: Once | INTRAMUSCULAR | Status: AC
  Administered 2023-11-03: 200 ug via SUBCUTANEOUS

## 2023-11-03 NOTE — Progress Notes (Signed)
 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, white count elevated.     RBC (Red Blood Cell Count) 4.04 - 5.48 10^6/uL 3.02 Low     Hemoglobin 12.0 - 15.0 gm/dL 16.1 Low     Hematocrit 35.0 - 47.0 % 31.9 Low     MCV (Mean Corpuscular Volume) 80.0 - 100.0 fl 105.6 High     MCH (Mean Corpuscular Hemoglobin) 27.0 - 31.2 pg 34.8 High     MCHC (Mean Corpuscular Hemoglobin Concentration) 32.0 - 36.0 gm/dL 09.6    Platelet Count 150 - 450 10^3/uL 180    RDW-CV (Red Cell Distribution Width) 11.6 - 14.8 % 14.5    MPV (Mean Platelet Volume) 9.4 - 12.4 fl 9.1 Low     Neutrophils 1.50 - 7.80 10^3/uL 16.28 High     Lymphocytes 1.00 - 3.60 10^3/uL 4.61 High     Monocytes 0.00 - 1.50 10^3/uL 16.92 High     Eosinophils 0.00 - 0.55 10^3/uL 0.48    Basophils 0.00 - 0.09 10^3/uL 0.07    Neutrophil % 32.0 - 70.0 % 41.2    Lymphocyte % 10.0 - 50.0 % 11.7    Monocyte % 4.0 - 13.0 % 42.8 High   See manual diff and path review.   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   Accompanied by daughter.   Amanda Rose 89 y.o.  female pleasant chronic myelomonocytic leukemia  currently on INQOVI is here for follow-up.   Appetite 50% normal, has 1 ensure per day.  Daughter states pt has been very fatigued and weak. Has some dizziness occasionally.  No falls.   No diarrhea. No fever or chills.  No worsening cough.  No fever no chills.   Review of Systems  Constitutional:  Positive for malaise/fatigue. Negative for chills, [name], palpitations, orthopnea and leg swelling.  Gastrointestinal:  Negative for blood in stool, constipation, diarrhea, heartburn, melena, nausea and  vomiting.  Genitourinary:  Negative for dysuria, frequency and urgency.  Musculoskeletal:  Negative for joint pain.  Skin: Negative.  Negative for itching and rash.  Neurological:  Negative for dizziness, tingling, focal weakness, weakness and headaches.  Endo/Heme/Allergies:  Does not bruise/bleed easily.  Psychiatric/Behavioral:  Negative for depression. The patient is not nervous/anxious and does not have insomnia.       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)    Hypothyroidism    Lower extremity weakness    Pre-diabetes    Thyroid disease    Weakness of both legs      PAST SURGICAL HISTORY :   Past Surgical History:  Procedure Laterality Date   IR KYPHO EA ADDL LEVEL THORACIC OR LUMBAR  12/17/2021   IR KYPHO THORACIC WITH BONE BIOPSY  12/17/2021   IR RADIOLOGIST EVAL & MGMT  12/09/2021   KYPHOPLASTY N/A 04/05/2021   Procedure: T10 and L2 KYPHOPLASTY;  [name], MD;  [name];  Service: Orthopedics;  [name];    FAMILY HISTORY :   Family History  Problem Relation Age of Onset   Cancer Father        unknown    SOCIAL HISTORY:   Social History   Tobacco Use   Smoking status: Never   Smokeless tobacco: Never  Vaping Use   Vaping status: Never Used  Substance Use Topics   Alcohol use: Never   Drug use: Never    ALLERGIES:  is allergic to other.  MEDICATIONS:  Current Outpatient Medications  Medication Sig Dispense Refill   allopurinol (ZYLOPRIM) 100 MG tablet TAKE 1 TABLET BY MOUTH TWICE A DAY 180 tablet 1   Cholecalciferol (VITAMIN D-3) 125 MCG (5000 UT) TABS Take 5,[name],[name], hold for 23d, [name], at least 2 hr before or after food 5 tablet 0   levothyroxine (SYNTHROID) 100 MCG tablet Take 100 mcg by mouth daily before breakfast.     Magnesium 100 MG CAPS Take by mouth.     Multiple Vitamin (MULTIVITAMIN WITH MINERALS) TABS tablet Take 1 tablet by mouth in the morning. Equate Complete Multivitamin     ondansetron (ZOFRAN-ODT) 4 MG disintegrating tablet Take 1 tablet (4 mg total) by mouth every 8 (eight) hours as needed. 90 tablet 1   No current facility-administered medications for this visit.    PHYSICAL EXAMINATION:  BP (!) 122/54 (BP Location: Left Arm, Patient Position: Sitting, Cuff Size: Normal)   Pulse 94   Temp 97.7 F (36.5 C) (Tympanic)    Ht 4\' 11"  (1.499 m)   Wt 101 lb 14.4 oz (46.2 kg)   SpO2 98%   BMI 20.58 kg/m   Filed Weights   11/03/23 1036  Weight: 101 lb 14.4 oz (46.2 kg)    Physical Exam Vitals and nursing note reviewed.  HENT:     Head: Normocephalic and atraumatic.     Mouth/Throat:     Pharynx: Oropharynx is clear.  Eyes:     Extraocular Movements: Extraocular movements intact.     Pupils: Pupils are equal, round, and reactive to light.  Cardiovascular:  Rate and Rhythm: Normal rate and regular rhythm.  Pulmonary:     Comments: Decreased breath sounds bilaterally.  Abdominal:     Palpations: Abdomen is soft.  Musculoskeletal:        General: Normal range of motion.     Cervical back: Normal range of motion.  Skin:    General: Skin is warm.  Neurological:     General: No focal deficit present.     Mental Status: She is alert and oriented to person, place, and time.  Psychiatric:        Behavior: Behavior normal.        Judgment: Judgment normal.        LABORATORY DATA:  I have reviewed the data as listed    Component Value Date/Time   NA 137 11/03/2023 1016   K 4.1 11/03/2023 1016   CL 105 11/03/2023 1016   CO2 25 11/03/2023 1016   GLUCOSE 107 (H) 11/03/2023 1016   BUN 48 (H) 11/03/2023 1016   CREATININE 1.57 (H) 11/03/2023 1016   CALCIUM 8.9 11/03/2023 1016   PROT 7.3 11/03/2023 1016   ALBUMIN 3.9 11/03/2023 1016   AST 20 11/03/2023 1016   ALT 12 11/03/2023 1016   ALKPHOS 73 11/03/2023 1016   BILITOT 0.9 11/03/2023 1016   GFRNONAA 31 (L) 11/03/2023 1016   GFRAA >60 12/03/2017 1003    No results found for: "SPEP", "UPEP"  Lab Results  Component Value Date   WBC 5.6 11/03/2023   NEUTROABS 2.3 11/03/2023   HGB 8.3 (L) 11/03/2023   HCT 25.8 (L) 11/03/2023   MCV 103.2 (H) 11/03/2023   PLT 59 (L) 11/03/2023      Chemistry      Component Value Date/Time   NA 137 11/03/2023 1016   K 4.1 11/03/2023 1016   CL 105 11/03/2023 1016   CO2 25 11/03/2023 1016   BUN 48  (H) 11/03/2023 1016   CREATININE 1.57 (H) 11/03/2023 1016      Component Value Date/Time   CALCIUM 8.9 11/03/2023 1016   ALKPHOS 73 11/03/2023 1016   AST 20 11/03/2023 1016   ALT 12 11/03/2023 1016   BILITOT 0.9 11/03/2023 1016       RADIOGRAPHIC STUDIES: I have personally reviewed the radiological images as listed and agreed with the findings in the report. No results found.    ASSESSMENT & PLAN:  Chronic myelomonocytic leukemia not having achieved remission [facility]) #Chronic myelomonocytic leukemia- April 2023-bone marrow biopsy: Hypercellular marrow with myeloid hyperplasia and monocytosis worrisome for a myeloid  proliferative/myelodysplastic syndrome, specifically chronic  myelomonocytic leukemia; myeloid panel.  NEAGTIVE for JAK2 mutation; MPL; CALR mutation; PDGRA-NEGATIVE. JAN 2025-currently on INQOVI [po decitabine]- and aranesp   #  today day cycle #2 #14  INQOVI [po decitabine]- and aranesp today as Hb 8.4.Platelets- 59; WBC- 5.6. proceed with aranesp  # back pain/spasm [Dr.Morales]- a/p epidural- currently on prn oxycodone- stable.    # GERD/reflux-Recommend NEXIUM over the counter prn- stable.   # Depression: Celexa x1 - noted to have dizzy spells. Declined Remeron 15 mg nightly.    # CKD stage III [43]- [place]- APRIl 2023- NO hydronephrosis.  Continue on allopurinol 100 g twice a day.  GFR- 35- stable.   # weight loss: Discussed regarding protein intake/muscle health.  # DISPOSITION: # NO blood tomorrow # proceed with Aranesp-  # as planned-  follow up in 2  week-MD labs- cbc/cmp; LDH;hold tube; aranesp Possible D-2 1 unit PRBC- Dr.B  No orders of the defined types were placed in this encounter.  All questions were answered. The patient knows to call the clinic with any problems, questions or concerns.      Earna Coder, MD 11/03/2023 12:09 PM

## 2023-11-03 NOTE — Progress Notes (Signed)
 Pt states she is due  a dental cleaning, is this ok?  Appetite 50% normal, has 1 ensure per day.  Daughter states pt has been very fatigued and weak. Has some dizziness occasionally.

## 2023-11-03 NOTE — Assessment & Plan Note (Addendum)
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  NEAGTIVE for JAK2 mutation; MPL; CALR mutation; PDGRA-NEGATIVE. JAN 2025-currently on INQOVI [po decitabine]- and aranesp   #  today day cycle #2 #14  INQOVI [po decitabine]- and aranesp today as Hb 8.4.Platelets- 59; WBC- 5.6. proceed with aranesp  # back pain/spasm [Dr.Morales]- a/p epidural- currently on prn oxycodone- stable.    # GERD/reflux-Recommend NEXIUM over the counter prn- stable.   # Depression: Celexa x1 - noted to have dizzy spells. Declined Remeron 15 mg nightly.    # CKD stage III [43]- [place]- APRIl 2023- NO hydronephrosis.  Continue on allopurinol 100 g twice a day.  GFR- 35- stable.   # weight loss: Discussed regarding protein intake/muscle health.  # DISPOSITION: # NO blood tomorrow # proceed with Aranesp-  # as planned-  follow up in 2  week-MD labs- cbc/cmp; LDH;hold tube; aranesp Possible D-2 1 unit PRBC- Dr.B

## 2023-11-04 ENCOUNTER — Inpatient Hospital Stay: Payer: Medicare HMO

## 2023-11-05 ENCOUNTER — Other Ambulatory Visit (HOSPITAL_COMMUNITY): Payer: Self-pay

## 2023-11-05 ENCOUNTER — Other Ambulatory Visit: Payer: Self-pay

## 2023-11-05 ENCOUNTER — Other Ambulatory Visit: Payer: Self-pay | Admitting: Internal Medicine

## 2023-11-05 DIAGNOSIS — C9310 Chronic myelomonocytic leukemia not having achieved remission: Secondary | ICD-10-CM

## 2023-11-05 MED ORDER — INQOVI 35-100 MG PO TABS
1.0000 | ORAL_TABLET | Freq: Every day | ORAL | 0 refills | Status: DC
  Filled 2023-11-05: qty 5, 28d supply, fill #0

## 2023-11-05 NOTE — Progress Notes (Signed)
 Specialty Pharmacy Refill Coordination Note  Amanda Rose is a 89 y.o. female patients daughter was contacted today regarding refills of specialty medication(s) Decitabine-Cedazuridine [name])   Patient requested Delivery   Delivery date: 11/11/23   Verified address: [address]  [place] [address]   Medication will be filled on 11/10/23.   This fill date is pending response to refill request from provider. Patient is aware and if they have not received fill by intended date they must follow up with pharmacy.

## 2023-11-10 ENCOUNTER — Other Ambulatory Visit: Payer: Self-pay

## 2023-11-17 ENCOUNTER — Other Ambulatory Visit (HOSPITAL_COMMUNITY): Payer: Self-pay

## 2023-11-17 ENCOUNTER — Other Ambulatory Visit: Payer: Self-pay

## 2023-11-17 ENCOUNTER — Encounter: Payer: Self-pay | Admitting: Internal Medicine

## 2023-11-17 ENCOUNTER — Inpatient Hospital Stay: Payer: Medicare HMO | Attending: Internal Medicine

## 2023-11-17 ENCOUNTER — Inpatient Hospital Stay: Payer: Medicare HMO

## 2023-11-17 ENCOUNTER — Inpatient Hospital Stay (HOSPITAL_BASED_OUTPATIENT_CLINIC_OR_DEPARTMENT_OTHER): Payer: Medicare HMO | Admitting: Internal Medicine

## 2023-11-17 VITALS — BP 123/64 | HR 81 | Wt 107.9 lb

## 2023-11-17 DIAGNOSIS — J40 Bronchitis, not specified as acute or chronic: Secondary | ICD-10-CM | POA: Insufficient documentation

## 2023-11-17 DIAGNOSIS — Z79899 Other long term (current) drug therapy: Secondary | ICD-10-CM | POA: Insufficient documentation

## 2023-11-17 DIAGNOSIS — C9310 Chronic myelomonocytic leukemia not having achieved remission: Secondary | ICD-10-CM | POA: Diagnosis not present

## 2023-11-17 DIAGNOSIS — D649 Anemia, unspecified: Secondary | ICD-10-CM

## 2023-11-17 LAB — CMP (CANCER CENTER ONLY)
ALT: 12 U/L (ref 0–44)
AST: 25 U/L (ref 15–41)
Albumin: 3.8 g/dL (ref 3.5–5.0)
Alkaline Phosphatase: 82 U/L (ref 38–126)
Anion gap: 9 (ref 5–15)
BUN: 29 mg/dL — ABNORMAL HIGH (ref 8–23)
CO2: 24 mmol/L (ref 22–32)
Calcium: 8.9 mg/dL (ref 8.9–10.3)
Chloride: 101 mmol/L (ref 98–111)
Creatinine: 1.18 mg/dL — ABNORMAL HIGH (ref 0.44–1.00)
GFR, Estimated: 44 mL/min — ABNORMAL LOW (ref >60)
Glucose, Bld: 110 mg/dL — ABNORMAL HIGH (ref 70–99)
Potassium: 4 mmol/L (ref 3.5–5.1)
Sodium: 134 mmol/L — ABNORMAL LOW (ref 135–145)
Total Bilirubin: 0.7 mg/dL (ref 0.0–1.2)
Total Protein: 7 g/dL (ref 6.5–8.1)

## 2023-11-17 LAB — CBC WITH DIFFERENTIAL (CANCER CENTER ONLY)
Abs Immature Granulocytes: 1.29 10*3/uL — ABNORMAL HIGH (ref 0.00–0.07)
Basophils Absolute: 0.2 10*3/uL — ABNORMAL HIGH (ref 0.0–0.1)
Basophils Relative: 1 %
Eosinophils Absolute: 0.2 10*3/uL (ref 0.0–0.5)
Eosinophils Relative: 1 %
HCT: 31.1 % — ABNORMAL LOW (ref 36.0–46.0)
Hemoglobin: 9.9 g/dL — ABNORMAL LOW (ref 12.0–15.0)
Immature Granulocytes: 7 %
Lymphocytes Relative: 12 %
Lymphs Abs: 2.3 10*3/uL (ref 0.7–4.0)
MCH: 32.1 pg (ref 26.0–34.0)
MCHC: 31.8 g/dL (ref 30.0–36.0)
MCV: 101 fL — ABNORMAL HIGH (ref 80.0–100.0)
Monocytes Absolute: 7.1 10*3/uL — ABNORMAL HIGH (ref 0.1–1.0)
Monocytes Relative: 40 %
Neutro Abs: 7.2 10*3/uL (ref 1.7–7.7)
Neutrophils Relative %: 39 %
Platelet Count: 278 10*3/uL (ref 150–400)
RBC: 3.08 MIL/uL — ABNORMAL LOW (ref 3.87–5.11)
RDW: 18.4 % — ABNORMAL HIGH (ref 11.5–15.5)
Smear Review: NORMAL
WBC Count: 18.3 10*3/uL — ABNORMAL HIGH (ref 4.0–10.5)
nRBC: 0.2 % (ref 0.0–0.2)

## 2023-11-17 LAB — LACTATE DEHYDROGENASE: LDH: 267 U/L — ABNORMAL HIGH (ref 98–192)

## 2023-11-17 LAB — SAMPLE TO BLOOD BANK

## 2023-11-17 MED ORDER — INQOVI 35-100 MG PO TABS
1.0000 | ORAL_TABLET | Freq: Every day | ORAL | 3 refills | Status: DC
  Filled 2023-11-17: qty 5, 5d supply, fill #0
  Filled 2023-11-30: qty 5, 28d supply, fill #0
  Filled 2023-12-31: qty 5, 28d supply, fill #1
  Filled 2024-01-27: qty 5, 28d supply, fill #2
  Filled 2024-03-07: qty 5, 28d supply, fill #3

## 2023-11-17 MED ORDER — DARBEPOETIN ALFA 200 MCG/0.4ML IJ SOSY
200.0000 ug | PREFILLED_SYRINGE | Freq: Once | INTRAMUSCULAR | Status: AC
  Administered 2023-11-17: 200 ug via SUBCUTANEOUS

## 2023-11-17 NOTE — Assessment & Plan Note (Addendum)
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  NEAGTIVE for JAK2 mutation; MPL; CALR mutation; PDGRA-NEGATIVE. JAN 2025-currently on INQOVI [po decitabine]- and aranesp   #  today day cycle #3 #1  INQOVI [po decitabine]- and aranesp today as Hb 9.9 Platelets- 150 ; WBC- 18. proceed with aranesp. Discussed that in general would recommend a bone marrow biopsy after 3 or 4 treatments.  However hold bone marrow given her age/ fraility, since tolerating well we will plan at least 6 cycles.   # back pain/spasm [Dr.Morales]- a/p epidural- currently on prn oxycodone- stable.   # GERD/reflux-Recommend NEXIUM over the counter prn- stable.   # Depression: Celexa x1 - noted to have dizzy spells. Declined Remeron 15 mg nightly.  stable.   # CKD stage III [43]- [place]- APRIl 2023- NO hydronephrosis.  Continue on allopurinol 100 g twice a day.  GFR- 43- stable.   # Chronic mild cough-recommend Mucinex/Robitussin as needed.  # weight loss: Discussed regarding protein intake/muscle health.  # DISPOSITION: # NO blood tomorrow # proceed with Aranesp- today # as planned- follow up in 2  week-MD labs- cbc/cmp; LDH;hold tube; aranesp Possible D-2 1 unit PRBC # as planned- follow up in 4  week-MD labs- cbc/cmp; LDH;hold tube; aranesp Possible D-2 1 unit PRBC-- Dr.B

## 2023-11-17 NOTE — Progress Notes (Signed)
 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, white count elevated.     RBC (Red Blood Cell Count) 4.04 - 5.48 10^6/uL 3.02 Low     Hemoglobin 12.0 - 15.0 gm/dL 16.1 Low     Hematocrit 35.0 - 47.0 % 31.9 Low     MCV (Mean Corpuscular Volume) 80.0 - 100.0 fl 105.6 High     MCH (Mean Corpuscular Hemoglobin) 27.0 - 31.2 pg 34.8 High     MCHC (Mean Corpuscular Hemoglobin Concentration) 32.0 - 36.0 gm/dL 09.6    Platelet Count 150 - 450 10^3/uL 180    RDW-CV (Red Cell Distribution Width) 11.6 - 14.8 % 14.5    MPV (Mean Platelet Volume) 9.4 - 12.4 fl 9.1 Low     Neutrophils 1.50 - 7.80 10^3/uL 16.28 High     Lymphocytes 1.00 - 3.60 10^3/uL 4.61 High     Monocytes 0.00 - 1.50 10^3/uL 16.92 High     Eosinophils 0.00 - 0.55 10^3/uL 0.48    Basophils 0.00 - 0.09 10^3/uL 0.07    Neutrophil % 32.0 - 70.0 % 41.2    Lymphocyte % 10.0 - 50.0 % 11.7    Monocyte % 4.0 - 13.0 % 42.8 High   See manual diff and path review.   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   Accompanied by daughter.   Amanda Rose 89 y.o.  female pleasant chronic myelomonocytic leukemia  currently on INQOVI; and aranesp is here for follow-up.  Patient denies any nausea vomiting or diarrhea.  Appetite is fair.  Denies any worsening pain cramping. Complains of  chronic mild fatigue not any worse. No falls.  No fever or chills.  Chronic mild cough no shortness of breath.   Review of Systems  Constitutional:  Positive for malaise/fatigue. Negative for chills, [name], palpitations, orthopnea and leg swelling.  Gastrointestinal:  Negative for blood in stool, constipation, diarrhea, heartburn, melena, nausea and  vomiting.  Genitourinary:  Negative for dysuria, frequency and urgency.  Musculoskeletal:  Negative for joint pain.  Skin: Negative.  Negative for itching and rash.  Neurological:  Negative for dizziness, tingling, focal weakness, weakness and headaches.  Endo/Heme/Allergies:  Does not bruise/bleed easily.  Psychiatric/Behavioral:  Negative for depression. The patient is not nervous/anxious and does not have insomnia.       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)    Hypothyroidism    Lower extremity weakness    Pre-diabetes    Thyroid disease     Weakness of both legs     PAST SURGICAL HISTORY :   Past Surgical History:  Procedure Laterality Date   IR KYPHO EA ADDL LEVEL THORACIC OR LUMBAR  12/17/2021   IR KYPHO THORACIC WITH BONE BIOPSY  12/17/2021   IR RADIOLOGIST EVAL & MGMT  12/09/2021   KYPHOPLASTY N/A 04/05/2021   Procedure: T10 and L2 KYPHOPLASTY;  [name], MD;  [name];  Service: Orthopedics;  [name];    FAMILY HISTORY :   Family History  Problem Relation Age of Onset   Cancer Father        unknown    SOCIAL HISTORY:   Social History   Tobacco Use   Smoking status: Never   Smokeless tobacco: Never  Vaping Use   Vaping status: Never Used  Substance Use Topics   Alcohol use: Never   Drug use: Never    ALLERGIES:  is allergic to other.  MEDICATIONS:  Current Outpatient Medications  Medication Sig Dispense Refill   allopurinol (ZYLOPRIM) 100 MG tablet TAKE 1 TABLET BY MOUTH TWICE A DAY 180 tablet 1   levothyroxine (SYNTHROID) 100 MCG tablet Take 100 mcg by mouth daily before breakfast.     Magnesium 100 MG CAPS Take by mouth.     Cholecalciferol (VITAMIN D-3) 125 MCG (5000 UT) TABS Take 5,000 Units by mouth in the morning. (Patient not taking: Reported on 11/17/2023)     Cyanocobalamin (VITAMIN B-12) 5000 MCG SUBL Take 5,000 mcg by mouth in the morning. (Patient not taking: Reported on 11/17/2023)     decitabine-cedazuridine (INQOVI) 35-100 MG oral tablet Take 1 tablet by mouth daily. Take for 5 days, hold for 23d, [name], at least 2 hr before or after food 5 tablet 3   Multiple Vitamin (MULTIVITAMIN WITH MINERALS) TABS tablet Take 1 tablet by mouth in the morning. Equate Complete Multivitamin (Patient not taking: Reported on 11/17/2023)     ondansetron (ZOFRAN-ODT) 4 MG disintegrating tablet Take 1 tablet (4 mg total) by mouth every 8 (eight) hours as needed. (Patient not taking: Reported on 11/17/2023) 90 tablet 1   No current  facility-administered medications for this visit.    PHYSICAL EXAMINATION:  BP 123/64   Pulse 81   Wt 107 lb 14.4 oz (48.9 kg)   SpO2 96%   BMI 21.79 kg/m   Filed Weights   11/17/23 0943  Weight: 107 lb 14.4 oz (48.9 kg)    Physical Exam Vitals and nursing note reviewed.  HENT:     Head: Normocephalic and atraumatic.     Mouth/Throat:     Pharynx: Oropharynx is clear.  Eyes:     Extraocular Movements: Extraocular movements intact.     Pupils: Pupils are equal, round, and reactive to light.  Cardiovascular:     Rate and  Rhythm: Normal rate and regular rhythm.  Pulmonary:     Comments: Decreased breath sounds bilaterally.  Abdominal:     Palpations: Abdomen is soft.  Musculoskeletal:        General: Normal range of motion.     Cervical back: Normal range of motion.  Skin:    General: Skin is warm.  Neurological:     General: No focal deficit present.     Mental Status: She is alert and oriented to person, place, and time.  Psychiatric:        Behavior: Behavior normal.        Judgment: Judgment normal.        LABORATORY DATA:  I have reviewed the data as listed    Component Value Date/Time   NA 134 (L) 11/17/2023 0919   K 4.0 11/17/2023 0919   CL 101 11/17/2023 0919   CO2 24 11/17/2023 0919   GLUCOSE 110 (H) 11/17/2023 0919   BUN 29 (H) 11/17/2023 0919   CREATININE 1.18 (H) 11/17/2023 0919   CALCIUM 8.9 11/17/2023 0919   PROT 7.0 11/17/2023 0919   ALBUMIN 3.8 11/17/2023 0919   AST 25 11/17/2023 0919   ALT 12 11/17/2023 0919   ALKPHOS 82 11/17/2023 0919   BILITOT 0.7 11/17/2023 0919   GFRNONAA 44 (L) 11/17/2023 0919   GFRAA >60 12/03/2017 1003    No results found for: "SPEP", "UPEP"  Lab Results  Component Value Date   WBC 18.3 (H) 11/17/2023   NEUTROABS 7.2 11/17/2023   HGB 9.9 (L) 11/17/2023   HCT 31.1 (L) 11/17/2023   MCV 101.0 (H) 11/17/2023   PLT 278 11/17/2023      Chemistry      Component Value Date/Time   NA 134 (L) 11/17/2023  0919   K 4.0 11/17/2023 0919   CL 101 11/17/2023 0919   CO2 24 11/17/2023 0919   BUN 29 (H) 11/17/2023 0919   CREATININE 1.18 (H) 11/17/2023 0919      Component Value Date/Time   CALCIUM 8.9 11/17/2023 0919   ALKPHOS 82 11/17/2023 0919   AST 25 11/17/2023 0919   ALT 12 11/17/2023 0919   BILITOT 0.7 11/17/2023 0919       RADIOGRAPHIC STUDIES: I have personally reviewed the radiological images as listed and agreed with the findings in the report. No results found.    ASSESSMENT & PLAN:  Chronic myelomonocytic leukemia not having achieved remission [facility]) #Chronic myelomonocytic leukemia- April 2023-bone marrow biopsy: Hypercellular marrow with myeloid hyperplasia and monocytosis worrisome for a myeloid  proliferative/myelodysplastic syndrome, specifically chronic  myelomonocytic leukemia; myeloid panel.  NEAGTIVE for JAK2 mutation; MPL; CALR mutation; PDGRA-NEGATIVE. JAN 2025-currently on INQOVI [po decitabine]- and aranesp   #  today day cycle #3 #1  INQOVI [po decitabine]- and aranesp today as Hb 9.9 Platelets- 150 ; WBC- 18. proceed with aranesp. Discussed that in general would recommend a bone marrow biopsy after 3 or 4 treatments.  However hold bone marrow given her age/ fraility, since tolerating well we will plan at least 6 cycles.   # back pain/spasm [Dr.Morales]- a/p epidural- currently on prn oxycodone- stable.   # GERD/reflux-Recommend NEXIUM over the counter prn- stable.   # Depression: Celexa x1 - noted to have dizzy spells. Declined Remeron 15 mg nightly.  stable.   # CKD stage III [43]- [place]- APRIl 2023- NO hydronephrosis.  Continue on allopurinol 100 g twice a day.  GFR- 43- stable.   # Chronic mild cough-recommend Mucinex/Robitussin as needed.  #  weight loss: Discussed regarding protein intake/muscle health.  # DISPOSITION: # NO blood tomorrow # proceed with Aranesp- today # as planned- follow up in 2  week-MD labs- cbc/cmp; LDH;hold tube; aranesp Possible D-2 1  unit PRBC # as planned- follow up in 4  week-MD labs- cbc/cmp; LDH;hold tube; aranesp Possible D-2 1 unit PRBC-- Dr.B       Orders Placed This Encounter  Procedures   CBC with Differential (Cancer Center Only)    Standing Status:   Standing    Number of Occurrences:   2    Expiration Date:   11/16/2024   CMP (Cancer Center only)    Standing Status:   Standing    Number of Occurrences:   2    Expiration Date:   11/16/2024   Lactate dehydrogenase    Standing Status:   Standing    Number of Occurrences:   2    Expiration Date:   11/16/2024   Sample to Blood Bank    Standing Status:   Standing    Number of Occurrences:   2    Expiration Date:   11/16/2024   All questions were answered. The patient knows to call the clinic with any problems, questions or concerns.      Earna Coder, MD 11/17/2023 10:33 AM

## 2023-11-17 NOTE — Progress Notes (Signed)
 Patient has no concerns

## 2023-11-18 ENCOUNTER — Inpatient Hospital Stay: Payer: Medicare HMO

## 2023-11-24 ENCOUNTER — Other Ambulatory Visit: Payer: Self-pay

## 2023-11-27 ENCOUNTER — Other Ambulatory Visit: Payer: Self-pay

## 2023-11-30 ENCOUNTER — Other Ambulatory Visit: Payer: Self-pay | Admitting: Pharmacy Technician

## 2023-11-30 ENCOUNTER — Other Ambulatory Visit: Payer: Self-pay

## 2023-11-30 NOTE — Progress Notes (Signed)
 Specialty Pharmacy Refill Coordination Note  Amanda Rose is a 89 y.o. female contacted today regarding refills of specialty medication(s) Decitabine-Cedazuridine [name])  Spoke with Daughter   Patient requested Delivery   Delivery date: 12/09/23   Verified address: [address] [place]   Medication will be filled on 12/08/23.

## 2023-12-01 ENCOUNTER — Inpatient Hospital Stay

## 2023-12-01 ENCOUNTER — Encounter: Payer: Self-pay | Admitting: Internal Medicine

## 2023-12-01 ENCOUNTER — Inpatient Hospital Stay (HOSPITAL_BASED_OUTPATIENT_CLINIC_OR_DEPARTMENT_OTHER): Admitting: Internal Medicine

## 2023-12-01 VITALS — BP 120/55 | HR 94 | Temp 100.0°F | Resp 24 | Ht 59.0 in | Wt 105.0 lb

## 2023-12-01 DIAGNOSIS — C9310 Chronic myelomonocytic leukemia not having achieved remission: Secondary | ICD-10-CM | POA: Diagnosis not present

## 2023-12-01 DIAGNOSIS — D649 Anemia, unspecified: Secondary | ICD-10-CM

## 2023-12-01 LAB — CBC WITH DIFFERENTIAL (CANCER CENTER ONLY)
Abs Immature Granulocytes: 0.78 10*3/uL — ABNORMAL HIGH (ref 0.00–0.07)
Basophils Absolute: 0 10*3/uL (ref 0.0–0.1)
Basophils Relative: 1 %
Eosinophils Absolute: 0.1 10*3/uL (ref 0.0–0.5)
Eosinophils Relative: 2 %
HCT: 28.1 % — ABNORMAL LOW (ref 36.0–46.0)
Hemoglobin: 9.3 g/dL — ABNORMAL LOW (ref 12.0–15.0)
Immature Granulocytes: 11 %
Lymphocytes Relative: 7 %
Lymphs Abs: 0.5 10*3/uL — ABNORMAL LOW (ref 0.7–4.0)
MCH: 32.4 pg (ref 26.0–34.0)
MCHC: 33.1 g/dL (ref 30.0–36.0)
MCV: 97.9 fL (ref 80.0–100.0)
Monocytes Absolute: 1.1 10*3/uL — ABNORMAL HIGH (ref 0.1–1.0)
Monocytes Relative: 15 %
Neutro Abs: 4.9 10*3/uL (ref 1.7–7.7)
Neutrophils Relative %: 64 %
Platelet Count: 55 10*3/uL — ABNORMAL LOW (ref 150–400)
RBC: 2.87 MIL/uL — ABNORMAL LOW (ref 3.87–5.11)
RDW: 19 % — ABNORMAL HIGH (ref 11.5–15.5)
Smear Review: NORMAL
WBC Count: 7.4 10*3/uL (ref 4.0–10.5)
nRBC: 0 % (ref 0.0–0.2)

## 2023-12-01 LAB — CMP (CANCER CENTER ONLY)
ALT: 11 U/L (ref 0–44)
AST: 20 U/L (ref 15–41)
Albumin: 3.4 g/dL — ABNORMAL LOW (ref 3.5–5.0)
Alkaline Phosphatase: 69 U/L (ref 38–126)
Anion gap: 11 (ref 5–15)
BUN: 37 mg/dL — ABNORMAL HIGH (ref 8–23)
CO2: 21 mmol/L — ABNORMAL LOW (ref 22–32)
Calcium: 8.4 mg/dL — ABNORMAL LOW (ref 8.9–10.3)
Chloride: 97 mmol/L — ABNORMAL LOW (ref 98–111)
Creatinine: 1.38 mg/dL — ABNORMAL HIGH (ref 0.44–1.00)
GFR, Estimated: 36 mL/min — ABNORMAL LOW (ref >60)
Glucose, Bld: 160 mg/dL — ABNORMAL HIGH (ref 70–99)
Potassium: 3.8 mmol/L (ref 3.5–5.1)
Sodium: 129 mmol/L — ABNORMAL LOW (ref 135–145)
Total Bilirubin: 0.9 mg/dL (ref 0.0–1.2)
Total Protein: 7.1 g/dL (ref 6.5–8.1)

## 2023-12-01 LAB — SAMPLE TO BLOOD BANK

## 2023-12-01 LAB — LACTATE DEHYDROGENASE: LDH: 207 U/L — ABNORMAL HIGH (ref 98–192)

## 2023-12-01 MED ORDER — AZITHROMYCIN 250 MG PO TABS: 250.0000 mg | ORAL_TABLET | Freq: Every day | ORAL | 0 refills | Status: DC

## 2023-12-01 MED ORDER — DARBEPOETIN ALFA 200 MCG/0.4ML IJ SOSY
200.0000 ug | PREFILLED_SYRINGE | Freq: Once | INTRAMUSCULAR | Status: AC
  Administered 2023-12-01: 200 ug via SUBCUTANEOUS

## 2023-12-01 NOTE — Progress Notes (Signed)
 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, white count elevated.     RBC (Red Blood Cell Count) 4.04 - 5.48 10^6/uL 3.02 Low     Hemoglobin 12.0 - 15.0 gm/dL 16.1 Low     Hematocrit 35.0 - 47.0 % 31.9 Low     MCV (Mean Corpuscular Volume) 80.0 - 100.0 fl 105.6 High     MCH (Mean Corpuscular Hemoglobin) 27.0 - 31.2 pg 34.8 High     MCHC (Mean Corpuscular Hemoglobin Concentration) 32.0 - 36.0 gm/dL 09.6    Platelet Count 150 - 450 10^3/uL 180    RDW-CV (Red Cell Distribution Width) 11.6 - 14.8 % 14.5    MPV (Mean Platelet Volume) 9.4 - 12.4 fl 9.1 Low     Neutrophils 1.50 - 7.80 10^3/uL 16.28 High     Lymphocytes 1.00 - 3.60 10^3/uL 4.61 High     Monocytes 0.00 - 1.50 10^3/uL 16.92 High     Eosinophils 0.00 - 0.55 10^3/uL 0.48    Basophils 0.00 - 0.09 10^3/uL 0.07    Neutrophil % 32.0 - 70.0 % 41.2    Lymphocyte % 10.0 - 50.0 % 11.7    Monocyte % 4.0 - 13.0 % 42.8 High   See manual diff and path review.   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   Accompanied by daughter.   Amanda Rose 89 y.o.  female pleasant chronic myelomonocytic leukemia  currently on INQOVI; and aranesp is here for follow-up.  C/o cough, congestion, mucus, weakness and vomiting(twice) x 2 weeks.  Temp today 100. Cough keeping her from sleeping. Tried mucinex. Patient had sick contacts.    No appetite, 1 boost per day. She states solids are to hard to chew and swallow.   Patient denies any nausea vomiting or diarrhea. Complains of  chronic mild fatigue not any worse. No falls. No fever or chills.  Review of Systems  Constitutional:  Positive for chills and malaise/fatigue. Negative for diaphoresis and fever.  HENT:  Negative for nosebleeds and sore throat.   Eyes:  Negative for double vision.  Respiratory:  Positive for cough and shortness  of breath. Negative for hemoptysis and wheezing.   Cardiovascular:  Negative for chest pain, palpitations, orthopnea and leg swelling.  Gastrointestinal:  Negative for blood in stool, constipation, diarrhea, heartburn, melena, nausea and vomiting.  Genitourinary:  Negative for dysuria, frequency and urgency.  Musculoskeletal:  Negative for joint pain.  Skin: Negative.  Negative for itching and rash.  Neurological:  Negative for dizziness, tingling, focal weakness, weakness and headaches.  Endo/Heme/Allergies:  Does not bruise/bleed easily.  Psychiatric/Behavioral:  Negative for depression. The patient is not nervous/anxious and does not have insomnia.       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)    Hypothyroidism    Lower extremity weakness    Pre-diabetes    Thyroid disease    Weakness of both legs     PAST SURGICAL HISTORY :   Past Surgical History:  Procedure Laterality Date   IR KYPHO EA ADDL LEVEL THORACIC OR LUMBAR  12/17/2021   IR KYPHO THORACIC WITH BONE BIOPSY  12/17/2021   IR RADIOLOGIST EVAL & MGMT  12/09/2021   KYPHOPLASTY N/A 04/05/2021   Procedure: T10 and L2 KYPHOPLASTY;  [name], MD;  [name];  Service: Orthopedics;  [name];    FAMILY HISTORY :   Family History  Problem Relation Age of Onset   Cancer Father        unknown    SOCIAL HISTORY:   Social History   Tobacco Use   Smoking status: Never   Smokeless tobacco: Never  Vaping Use   Vaping status: Never Used  Substance Use Topics   Alcohol use: Never   Drug use: Never    ALLERGIES:  is allergic to other.  MEDICATIONS:  Current Outpatient Medications  Medication Sig Dispense Refill   allopurinol (ZYLOPRIM) 100 MG tablet TAKE 1 TABLET BY MOUTH TWICE A DAY 180 tablet 1   azithromycin (ZITHROMAX) 250 MG tablet Take 1 tablet (250 mg total) by mouth daily. 6 each 0   Cholecalciferol (VITAMIN D-3) 125 MCG (5000 UT) TABS Take 5,[name],[name], hold for 23d, [name], at least 2 hr before or after food 5 tablet 3   levothyroxine (SYNTHROID) 100 MCG tablet Take 100 mcg by mouth daily before breakfast.     Magnesium 100 MG CAPS Take by mouth.     Multiple Vitamin (MULTIVITAMIN WITH MINERALS) TABS tablet Take 1 tablet by mouth in the morning. Equate Complete Multivitamin     ondansetron (ZOFRAN-ODT) 4 MG disintegrating tablet  Take 1 tablet (4 mg total) by mouth every 8 (eight) hours as needed. 90 tablet 1   No current facility-administered medications for this visit.    PHYSICAL EXAMINATION:  BP (!) 120/55 (BP Location: Left Arm, Patient Position: Sitting, Cuff Size: Normal)   Pulse 94   Temp 100 F (37.8 C) (Tympanic)   Resp (!) 24   Ht 4\' 11"  (1.499 m)   Wt 105 lb (47.6 kg)   SpO2 96%   BMI 21.21 kg/m   Filed Weights   12/01/23 1050  Weight: 105 lb (47.6 kg)     Physical Exam Vitals and nursing  note reviewed.  HENT:     Head: Normocephalic and atraumatic.     Mouth/Throat:     Pharynx: Oropharynx is clear.  Eyes:     Extraocular Movements: Extraocular movements intact.     Pupils: Pupils are equal, round, and reactive to light.  Cardiovascular:     Rate and Rhythm: Normal rate and regular rhythm.  Pulmonary:     Comments: Decreased breath sounds bilaterally.  Abdominal:     Palpations: Abdomen is soft.  Musculoskeletal:        General: Normal range of motion.     Cervical back: Normal range of motion.  Skin:    General: Skin is warm.  Neurological:     General: No focal deficit present.     Mental Status: She is alert and oriented to person, place, and time.  Psychiatric:        Behavior: Behavior normal.        Judgment: Judgment normal.        LABORATORY DATA:  I have reviewed the data as listed    Component Value Date/Time   NA 129 (L) 12/01/2023 1036   K 3.8 12/01/2023 1036   CL 97 (L) 12/01/2023 1036   CO2 21 (L) 12/01/2023 1036   GLUCOSE 160 (H) 12/01/2023 1036   BUN 37 (H) 12/01/2023 1036   CREATININE 1.38 (H) 12/01/2023 1036   CALCIUM 8.4 (L) 12/01/2023 1036   PROT 7.1 12/01/2023 1036   ALBUMIN 3.4 (L) 12/01/2023 1036   AST 20 12/01/2023 1036   ALT 11 12/01/2023 1036   ALKPHOS 69 12/01/2023 1036   BILITOT 0.9 12/01/2023 1036   GFRNONAA 36 (L) 12/01/2023 1036   GFRAA >60 12/03/2017 1003    No results found for: "SPEP", "UPEP"  Lab Results  Component  Value Date   WBC 7.4 12/01/2023   NEUTROABS 4.9 12/01/2023   HGB 9.3 (L) 12/01/2023   HCT 28.1 (L) 12/01/2023   MCV 97.9 12/01/2023   PLT 55 (L) 12/01/2023      Chemistry      Component Value Date/Time   NA 129 (L) 12/01/2023 1036   K 3.8 12/01/2023 1036   CL 97 (L) 12/01/2023 1036   CO2 21 (L) 12/01/2023 1036   BUN 37 (H) 12/01/2023 1036   CREATININE 1.38 (H) 12/01/2023 1036      Component Value Date/Time   CALCIUM 8.4 (L) 12/01/2023 1036   ALKPHOS 69 12/01/2023 1036   AST 20 12/01/2023 1036   ALT 11 12/01/2023 1036   BILITOT 0.9 12/01/2023 1036       RADIOGRAPHIC STUDIES: I have personally reviewed the radiological images as listed and agreed with the findings in the report. No results found.    ASSESSMENT & PLAN:  Chronic myelomonocytic leukemia not having achieved remission [facility]) #Chronic myelomonocytic leukemia- April 2023-bone marrow biopsy: Hypercellular marrow with myeloid hyperplasia and monocytosis worrisome for a myeloid  proliferative/myelodysplastic syndrome, specifically chronic  myelomonocytic leukemia; myeloid panel.  NEAGTIVE for JAK2 mutation; MPL; CALR mutation; PDGRA-NEGATIVE. JAN 2025-currently on INQOVI [po decitabine]- and aranesp   #  currently cycle #3 #15  INQOVI [po decitabine]- and aranesp today as Hb 9.9 Platelets- 155 ; WBC- 7 proceed with aranesp.   # Discussed that in general would recommend a bone marrow biopsy after 3 or 4 treatments.  However hold bone marrow given her age/ fraility, since tolerating well we will plan at least 6 cycles.    # Acute- Bronchitis-.C/o cough, congestion, mucus, weakness and  vomiting(twice) x 2 weeks- Azithromycin- 250 mg day. Continie robitussin.mucinex.   # back pain/spasm [Dr.Morales]- a/p epidural- currently on prn oxycodone- stable.   # GERD/reflux-Recommend NEXIUM over the counter prn- stable. .   # Depression: Celexa x1 - noted to have dizzy spells. Declined Remeron 15 mg nightly.  stable.   # CKD  stage III [43]- [place]- APRIl 2023- NO hydronephrosis.  Continue on allopurinol 100 g twice a day.  GFR- 33 stable.   # Chronic mild cough-recommend Mucinex/Robitussin as needed.  # DISPOSITION: # NO blood tomorrow # proceed with Aranesp- today # as planned- follow up in 2  week-MD labs- cbc/cmp; LDH;hold tube; aranesp Possible D-2 1 unit PRBC # as planned- follow up in 4  week-MD labs- cbc/cmp; LDH;hold tube; aranesp Possible D-2 1 unit PRBC-- Dr.B   No orders of the defined types were placed in this encounter.  All questions were answered. The patient knows to call the clinic with any problems, questions or concerns.      Earna Coder, MD 12/01/2023 12:20 PM

## 2023-12-01 NOTE — Assessment & Plan Note (Addendum)
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  NEAGTIVE for JAK2 mutation; MPL; CALR mutation; PDGRA-NEGATIVE. JAN 2025-currently on INQOVI [po decitabine]- and aranesp   #  currently cycle #3 #15  INQOVI [po decitabine]- and aranesp today as Hb 9.9 Platelets- 155 ; WBC- 7 proceed with aranesp.   # Discussed that in general would recommend a bone marrow biopsy after 3 or 4 treatments.  However hold bone marrow given her age/ fraility, since tolerating well we will plan at least 6 cycles.    # Acute- Bronchitis-.C/o cough, congestion, mucus, weakness and vomiting(twice) x 2 weeks- Azithromycin- 250 mg day. Continie robitussin.mucinex.   # back pain/spasm [Dr.Morales]- a/p epidural- currently on prn oxycodone- stable.   # GERD/reflux-Recommend NEXIUM over the counter prn- stable. .   # Depression: Celexa x1 - noted to have dizzy spells. Declined Remeron 15 mg nightly.  stable.   # CKD stage III [43]- [place]- APRIl 2023- NO hydronephrosis.  Continue on allopurinol 100 g twice a day.  GFR- 33 stable.   # Chronic mild cough-recommend Mucinex/Robitussin as needed.  # DISPOSITION: # NO blood tomorrow # proceed with Aranesp- today # as planned- follow up in 2  week-MD labs- cbc/cmp; LDH;hold tube; aranesp Possible D-2 1 unit PRBC # as planned- follow up in 4  week-MD labs- cbc/cmp; LDH;hold tube; aranesp Possible D-2 1 unit PRBC-- Dr.B

## 2023-12-01 NOTE — Progress Notes (Signed)
 C/o cough, congestion, mucus, weakness and vomiting(twice) x2weeks. Asking for rx. Temp today 100. Cough keeping her from sleeping. Tried mucinex.  No appetite, 1 boost per day. She states solids are to hard to chew and swallow.

## 2023-12-02 ENCOUNTER — Inpatient Hospital Stay

## 2023-12-03 ENCOUNTER — Encounter: Payer: Self-pay | Admitting: Internal Medicine

## 2023-12-03 ENCOUNTER — Other Ambulatory Visit: Payer: Self-pay

## 2023-12-08 ENCOUNTER — Other Ambulatory Visit: Payer: Self-pay

## 2023-12-15 ENCOUNTER — Inpatient Hospital Stay: Attending: Internal Medicine

## 2023-12-15 ENCOUNTER — Inpatient Hospital Stay (HOSPITAL_BASED_OUTPATIENT_CLINIC_OR_DEPARTMENT_OTHER): Admitting: Internal Medicine

## 2023-12-15 ENCOUNTER — Encounter: Payer: Self-pay | Admitting: Internal Medicine

## 2023-12-15 ENCOUNTER — Inpatient Hospital Stay

## 2023-12-15 VITALS — BP 131/66 | HR 86 | Temp 98.6°F | Resp 20 | Wt 105.0 lb

## 2023-12-15 DIAGNOSIS — C9310 Chronic myelomonocytic leukemia not having achieved remission: Secondary | ICD-10-CM | POA: Diagnosis not present

## 2023-12-15 DIAGNOSIS — R112 Nausea with vomiting, unspecified: Secondary | ICD-10-CM | POA: Insufficient documentation

## 2023-12-15 DIAGNOSIS — Z79899 Other long term (current) drug therapy: Secondary | ICD-10-CM | POA: Diagnosis not present

## 2023-12-15 DIAGNOSIS — J40 Bronchitis, not specified as acute or chronic: Secondary | ICD-10-CM | POA: Diagnosis not present

## 2023-12-15 DIAGNOSIS — D649 Anemia, unspecified: Secondary | ICD-10-CM

## 2023-12-15 DIAGNOSIS — N183 Chronic kidney disease, stage 3 unspecified: Secondary | ICD-10-CM | POA: Insufficient documentation

## 2023-12-15 LAB — CMP (CANCER CENTER ONLY)
ALT: 11 U/L (ref 0–44)
AST: 23 U/L (ref 15–41)
Albumin: 3.5 g/dL (ref 3.5–5.0)
Alkaline Phosphatase: 82 U/L (ref 38–126)
Anion gap: 9 (ref 5–15)
BUN: 34 mg/dL — ABNORMAL HIGH (ref 8–23)
CO2: 22 mmol/L (ref 22–32)
Calcium: 8.4 mg/dL — ABNORMAL LOW (ref 8.9–10.3)
Chloride: 103 mmol/L (ref 98–111)
Creatinine: 1.23 mg/dL — ABNORMAL HIGH (ref 0.44–1.00)
GFR, Estimated: 42 mL/min — ABNORMAL LOW (ref >60)
Glucose, Bld: 95 mg/dL (ref 70–99)
Potassium: 4 mmol/L (ref 3.5–5.1)
Sodium: 134 mmol/L — ABNORMAL LOW (ref 135–145)
Total Bilirubin: 0.5 mg/dL (ref 0.0–1.2)
Total Protein: 7.2 g/dL (ref 6.5–8.1)

## 2023-12-15 LAB — CBC WITH DIFFERENTIAL (CANCER CENTER ONLY)
Abs Immature Granulocytes: 1.75 10*3/uL — ABNORMAL HIGH (ref 0.00–0.07)
Basophils Absolute: 0.4 10*3/uL — ABNORMAL HIGH (ref 0.0–0.1)
Basophils Relative: 2 %
Eosinophils Absolute: 0.1 10*3/uL (ref 0.0–0.5)
Eosinophils Relative: 1 %
HCT: 31.4 % — ABNORMAL LOW (ref 36.0–46.0)
Hemoglobin: 9.9 g/dL — ABNORMAL LOW (ref 12.0–15.0)
Immature Granulocytes: 9 %
Lymphocytes Relative: 15 %
Lymphs Abs: 2.8 10*3/uL (ref 0.7–4.0)
MCH: 30.8 pg (ref 26.0–34.0)
MCHC: 31.5 g/dL (ref 30.0–36.0)
MCV: 97.8 fL (ref 80.0–100.0)
Monocytes Absolute: 5.7 10*3/uL — ABNORMAL HIGH (ref 0.1–1.0)
Monocytes Relative: 30 %
Neutro Abs: 8.2 10*3/uL — ABNORMAL HIGH (ref 1.7–7.7)
Neutrophils Relative %: 43 %
Platelet Count: 388 10*3/uL (ref 150–400)
RBC: 3.21 MIL/uL — ABNORMAL LOW (ref 3.87–5.11)
RDW: 18.6 % — ABNORMAL HIGH (ref 11.5–15.5)
WBC Count: 19 10*3/uL — ABNORMAL HIGH (ref 4.0–10.5)
nRBC: 0.3 % — ABNORMAL HIGH (ref 0.0–0.2)

## 2023-12-15 LAB — LACTATE DEHYDROGENASE: LDH: 313 U/L — ABNORMAL HIGH (ref 98–192)

## 2023-12-15 LAB — SAMPLE TO BLOOD BANK

## 2023-12-15 MED ORDER — DARBEPOETIN ALFA 200 MCG/0.4ML IJ SOSY
200.0000 ug | PREFILLED_SYRINGE | Freq: Once | INTRAMUSCULAR | Status: AC
  Administered 2023-12-15: 200 ug via SUBCUTANEOUS

## 2023-12-15 MED ORDER — BENZONATATE 100 MG PO CAPS: 100.0000 mg | ORAL_CAPSULE | Freq: Three times a day (TID) | ORAL | 1 refills | Status: DC | PRN

## 2023-12-15 NOTE — Progress Notes (Signed)
 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, 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   Accompanied by daughter.   Amanda Rose 89 y.o.  female pleasant chronic myelomonocytic leukemia  currently on INQOVI; and aranesp is here for follow-up.  Patient was diagnosed with Bronchitis recetnly and she has been taking robitussin over the counter for a solid two weeks, and she wants to know how much longer would it be ok for her to take at night.   Patient noted to have nausea and vomiting x 2-3 episodes in last 2 months or so.    No appetite, 1 boost per day. Patient denies any diarrhea. Complains of  chronic mild fatigue not any worse. No falls. No fever or chills.  Review of Systems  Constitutional:  Positive for chills and malaise/fatigue. Negative for diaphoresis and fever.  HENT:  Negative for nosebleeds and sore throat.  Eyes:  Negative for double vision.  Respiratory:  Positive for cough and shortness of breath. Negative for hemoptysis and wheezing.   Cardiovascular:  Negative for chest pain, palpitations, orthopnea and leg swelling.  Gastrointestinal:  Negative for blood in stool, constipation, diarrhea, heartburn, melena, nausea and vomiting.  Genitourinary:  Negative for dysuria, frequency and urgency.  Musculoskeletal:  Negative for joint pain.  Skin: Negative.  Negative for itching and rash.  Neurological:  Negative for dizziness, tingling, focal weakness, weakness and headaches.  Endo/Heme/Allergies:  Does not bruise/bleed easily.  Psychiatric/Behavioral:  Negative for depression. The patient is not nervous/anxious and does not have  insomnia.       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)    Hypothyroidism    Lower extremity weakness    Pre-diabetes    Thyroid disease    Weakness of both legs     PAST SURGICAL HISTORY :   Past Surgical History:  Procedure Laterality Date   IR KYPHO EA ADDL LEVEL THORACIC OR LUMBAR  12/17/2021   IR KYPHO THORACIC WITH BONE BIOPSY  12/17/2021   IR RADIOLOGIST EVAL & MGMT  12/09/2021   KYPHOPLASTY N/A 04/05/2021   Procedure: T10 and L2 KYPHOPLASTY;  [name], MD;  [name];  Service: Orthopedics;  [name];    FAMILY HISTORY :   Family History  Problem Relation Age of Onset   Cancer Father        unknown    SOCIAL HISTORY:   Social History   Tobacco Use   Smoking status: Never   Smokeless tobacco: Never  Vaping Use   Vaping status: Never Used  Substance Use Topics   Alcohol use: Never   Drug use: Never    ALLERGIES:  is allergic to other.  MEDICATIONS:  Current Outpatient Medications  Medication Sig Dispense Refill   benzonatate (TESSALON) 100 MG capsule Take 1 capsule (100 mg total) by mouth 3 (three) times daily as needed for cough. 60 capsule 1   allopurinol (ZYLOPRIM) 100 MG tablet TAKE 1 TABLET BY MOUTH TWICE A DAY 180 tablet 1   azithromycin (ZITHROMAX) 250 MG tablet Take 1 tablet (250 mg total) by mouth daily. 6 each 0   Cholecalciferol (VITAMIN D-3) 125 MCG (5000 UT) TABS Take 5,[name],[name], hold for 23d, [name], at least 2 hr before or after food 5 tablet 3   levothyroxine (SYNTHROID) 100 MCG tablet Take 100 mcg by mouth daily before breakfast.     Magnesium 100 MG CAPS Take by mouth.      Multiple Vitamin (MULTIVITAMIN WITH MINERALS) TABS tablet Take 1 tablet by mouth in the morning. Equate Complete Multivitamin     ondansetron (ZOFRAN-ODT) 4 MG disintegrating tablet Take 1 tablet (4 mg total) by mouth every 8 (eight) hours as needed. 90 tablet 1   No current facility-administered medications for this visit.    PHYSICAL EXAMINATION:  BP 131/66 (BP Location: Left Arm, Patient Position: Sitting, Cuff Size: Normal)   Pulse 86   Temp 98.6 F (37 C) (Tympanic)   Resp 20   Wt 105 lb (47.6 kg)  SpO2 96%   BMI 21.21 kg/m   Filed Weights   12/15/23 1433  Weight: 105 lb (47.6 kg)     Physical Exam Vitals and nursing note reviewed.  HENT:     Head: Normocephalic and atraumatic.     Mouth/Throat:     Pharynx: Oropharynx is clear.  Eyes:     Extraocular Movements: Extraocular movements intact.     Pupils: Pupils are equal, round, and reactive to light.  Cardiovascular:     Rate and Rhythm: Normal rate and regular rhythm.  Pulmonary:     Comments: Decreased breath sounds bilaterally.  Abdominal:     Palpations: Abdomen is soft.  Musculoskeletal:        General: Normal range of motion.     Cervical back: Normal range of motion.  Skin:    General: Skin is warm.  Neurological:     General: No focal deficit present.     Mental Status: She is alert and oriented to person, place, and time.  Psychiatric:        Behavior: Behavior normal.        Judgment: Judgment normal.        LABORATORY DATA:  I have reviewed the data as listed    Component Value Date/Time   NA 134 (L) 12/15/2023 1402   K 4.0 12/15/2023 1402   CL 103 12/15/2023 1402   CO2 22 12/15/2023 1402   GLUCOSE 95 12/15/2023 1402   BUN 34 (H) 12/15/2023 1402   CREATININE 1.23 (H) 12/15/2023 1402   CALCIUM 8.4 (L) 12/15/2023 1402   PROT 7.2 12/15/2023 1402   ALBUMIN 3.5 12/15/2023 1402   AST 23 12/15/2023 1402   ALT 11 12/15/2023 1402   ALKPHOS 82 12/15/2023 1402   BILITOT 0.5 12/15/2023  1402   GFRNONAA 42 (L) 12/15/2023 1402   GFRAA >60 12/03/2017 1003    No results found for: "SPEP", "UPEP"  Lab Results  Component Value Date   WBC 19.0 (H) 12/15/2023   NEUTROABS 8.2 (H) 12/15/2023   HGB 9.9 (L) 12/15/2023   HCT 31.4 (L) 12/15/2023   MCV 97.8 12/15/2023   PLT 388 12/15/2023      Chemistry      Component Value Date/Time   NA 134 (L) 12/15/2023 1402   K 4.0 12/15/2023 1402   CL 103 12/15/2023 1402   CO2 22 12/15/2023 1402   BUN 34 (H) 12/15/2023 1402   CREATININE 1.23 (H) 12/15/2023 1402      Component Value Date/Time   CALCIUM 8.4 (L) 12/15/2023 1402   ALKPHOS 82 12/15/2023 1402   AST 23 12/15/2023 1402   ALT 11 12/15/2023 1402   BILITOT 0.5 12/15/2023 1402       RADIOGRAPHIC STUDIES: I have personally reviewed the radiological images as listed and agreed with the findings in the report. No results found.    ASSESSMENT & PLAN:  Chronic myelomonocytic leukemia not having achieved remission [facility]) #Chronic myelomonocytic leukemia- April 2023-bone marrow biopsy: Hypercellular marrow with myeloid hyperplasia and monocytosis worrisome for a myeloid  proliferative/myelodysplastic syndrome, specifically chronic  myelomonocytic leukemia; myeloid panel.  NEAGTIVE for JAK2 mutation; MPL; CALR mutation; PDGRA-NEGATIVE. JAN 2025-currently on INQOVI [po decitabine]- and aranesp   #  currently cycle # 4 #1  INQOVI [po decitabine]- and aranesp today as Hb 9.9 Platelets- 155; However hold bone marrow given her age/ fraility- and the fact that there does not seems to have any hematologic response noted.    # Nausea/vomiting- ?  Related to INQOVI-recommend antiemetics as needed.  # Acute- Bronchitis- s/p  Azithromycin- 250 mg day. Continie robitussin.mucinex; add tessalon pearls.   # back pain/spasm [Dr.Morales]- a/p epidural- currently on prn oxycodone- stable.   # GERD/reflux-Recommend NEXIUM over the counter prn- stable.   # Depression: Celexa x1 - noted to  have dizzy spells. Declined Remeron 15 mg nightly.  stable.   # CKD stage III [43]- [place]- APRIl 2023- NO hydronephrosis.  Continue on allopurinol 100 g twice a day.  GFR- 43 stable.   # DISPOSITION: # NO blood tomorrow # proceed with Aranesp- today # as planned- follow up in 4  week-MD labs- cbc/cmp; LDH;hold tube; aranesp Possible D-2 1 unit PRBC-- Dr.B   Orders Placed This Encounter  Procedures   CMP (Cancer Center only)    Standing Status:   Future    Expected Date:   01/12/2024    Expiration Date:   12/14/2024   CBC with Differential (Cancer Center Only)    Standing Status:   Future    Expected Date:   01/12/2024    Expiration Date:   12/14/2024   Lactate dehydrogenase    Standing Status:   Future    Expected Date:   01/12/2024    Expiration Date:   12/14/2024   Sample to Blood Bank    Standing Status:   Future    Expected Date:   01/12/2024    Expiration Date:   12/14/2024   All questions were answered. The patient knows to call the clinic with any problems, questions or concerns.      Earna Coder, MD 12/15/2023 3:09 PM

## 2023-12-15 NOTE — Assessment & Plan Note (Addendum)
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  NEAGTIVE for JAK2 mutation; MPL; CALR mutation; PDGRA-NEGATIVE. JAN 2025-currently on INQOVI [po decitabine]- and aranesp   #  currently cycle # 4 #1  INQOVI [po decitabine]- and aranesp today as Hb 9.9 Platelets- 155; However hold bone marrow given her age/ fraility- and the fact that there does not seems to have any hematologic response noted.    # Nausea/vomiting- ? Related to INQOVI-recommend antiemetics as needed.  # Acute- Bronchitis- s/p  Azithromycin- 250 mg day. Continie robitussin.mucinex; add tessalon pearls.   # back pain/spasm [Dr.Morales]- a/p epidural- currently on prn oxycodone- stable.   # GERD/reflux-Recommend NEXIUM over the counter prn- stable.   # Depression: Celexa x1 - noted to have dizzy spells. Declined Remeron 15 mg nightly.  stable.   # CKD stage III [43]- [place]- APRIl 2023- NO hydronephrosis.  Continue on allopurinol 100 g twice a day.  GFR- 43 stable.   # DISPOSITION: # NO blood tomorrow # proceed with Aranesp- today # as planned- follow up in 4  week-MD labs- cbc/cmp; LDH;hold tube; aranesp Possible D-2 1 unit PRBC-- Dr.B

## 2023-12-15 NOTE — Progress Notes (Signed)
 Patient was diagnosed with Bronchitis recetnly and she has been taking robitussin over the counter for a solid two weeks, and she wants to know  how much longer would it be ok for her to take at night.

## 2023-12-16 ENCOUNTER — Inpatient Hospital Stay

## 2023-12-16 ENCOUNTER — Inpatient Hospital Stay: Admitting: Dietician

## 2023-12-16 NOTE — Progress Notes (Signed)
 Nutrition Follow UP:  Reached out to patient at home telephone# daughter with her during call.  Patient relays weight loss r/t to lack of appetite Anorexia a concern for past year.  Patient has also been recovering from bronchitis with Abx therapy which may contribute to anorexia.  She lives with daughter and is eating  3 meals a day but portions are smaller. Drinking Boost plus QD. Between breakfast and lunch. Tried to suggest taking at [facility] so she wouldn't be filled up earlier. She states sleep is interrupted with sugary foods.  Saltine help her sleep and settle her stomach at night. Daughter relayed that patient doesn't like plain water, patient says it taste "tinny." Patient does enjoy mil and is willing to add that at [facility] with peanut butter on crackers.     Medications: Magnesium, Vit D, Vit B 12, MVI     Labs: 12/15/23  Na 134 BUN 34, creatinine 1.23, Ca 8.4, Hgb 9.9     Anthropometrics: weight loss gradual 10#  (8.6%) past  6 months  Height: 4 ft 11 inches Weight:  12/15/23  105# 12/01/23  105# 10/20/23  109# BMI: 21.21     Estimated Energy Needs   Kcals: 1250-1500 Protein: 62-75 g Fluid: 1250-1500 ml     NUTRITION DIAGNOSIS: Inadequate oral intake related to chronic illness as evidenced by  8.6% weight loss in the last 6 months and decreased intake. Continues     INTERVENTION:  Encouraged low sugar HS snack with milk as fluid. Reviewed low/zero sugar alternatives to increase hydration as water substitute Encourage weight maintenance.   Next Visit: no follow-up scheduled Patient will contact RD if needed  Gennaro Africa, RDN, LDN Registered Dietitian, [facility] Part Time Remote (Usual office hours: Tuesday-Thursday) Mobile: [phone] Remote Office: [phone]

## 2023-12-28 ENCOUNTER — Encounter: Payer: Self-pay | Admitting: Internal Medicine

## 2023-12-28 ENCOUNTER — Ambulatory Visit: Admitting: Internal Medicine

## 2023-12-28 ENCOUNTER — Other Ambulatory Visit

## 2023-12-28 ENCOUNTER — Ambulatory Visit

## 2023-12-28 NOTE — Progress Notes (Signed)
 Spoke to the patient's daughter regarding headache.  Also watch out for the shingles rash for now recommend conservative measures if not improved to call us .  The daughter is in the agreement.

## 2023-12-29 ENCOUNTER — Ambulatory Visit: Admitting: Internal Medicine

## 2023-12-29 ENCOUNTER — Ambulatory Visit

## 2023-12-29 ENCOUNTER — Other Ambulatory Visit

## 2023-12-30 ENCOUNTER — Ambulatory Visit

## 2023-12-31 ENCOUNTER — Other Ambulatory Visit (HOSPITAL_COMMUNITY): Payer: Self-pay

## 2023-12-31 ENCOUNTER — Other Ambulatory Visit: Payer: Self-pay

## 2023-12-31 NOTE — Progress Notes (Signed)
 Specialty Pharmacy Ongoing Clinical Assessment Note  Amanda Rose is a 89 y.o. female who is being followed by the specialty pharmacy service for RxSp Oncology   Patient's specialty medication(s) reviewed today: Decitabine -Cedazuridine  (Inqovi )   Missed doses in the last 4 weeks: 0   Patient/Caregiver did not have any additional questions or concerns.   Therapeutic benefit summary: Unable to assess   Adverse events/side effects summary: Experienced adverse events/side effects (Family member reports intermittent headache, continued worsening fatigue, and intermittent episodes of vomiting. Provider aware of all of these.)   Patient's therapy is appropriate to: Continue    Goals Addressed             This Visit's Progress    Achieve or maintain remission       Patient is unable to be assessed as therapy was recently initiated. Patient will maintain adherence          Follow up:  3 months  [name] M Jessey Stehlin Specialty Pharmacist

## 2023-12-31 NOTE — Progress Notes (Signed)
 Specialty Pharmacy Refill Coordination Note  Amanda Rose is a 89 y.o. female contacted today regarding refills of specialty medication(s) Decitabine -Cedazuridine  (Inqovi )   Patient requested Delivery   Delivery date: 01/07/24   Verified address: [address] [place], [place] [address]   Medication will be filled on 01/06/24.

## 2024-01-06 ENCOUNTER — Other Ambulatory Visit: Payer: Self-pay

## 2024-01-12 ENCOUNTER — Inpatient Hospital Stay

## 2024-01-12 ENCOUNTER — Inpatient Hospital Stay (HOSPITAL_BASED_OUTPATIENT_CLINIC_OR_DEPARTMENT_OTHER): Admitting: Internal Medicine

## 2024-01-12 ENCOUNTER — Encounter: Payer: Self-pay | Admitting: Internal Medicine

## 2024-01-12 ENCOUNTER — Inpatient Hospital Stay: Attending: Internal Medicine

## 2024-01-12 VITALS — BP 134/58 | HR 82 | Temp 98.6°F | Resp 20 | Ht 59.0 in | Wt 108.0 lb

## 2024-01-12 DIAGNOSIS — C9310 Chronic myelomonocytic leukemia not having achieved remission: Secondary | ICD-10-CM | POA: Insufficient documentation

## 2024-01-12 DIAGNOSIS — D631 Anemia in chronic kidney disease: Secondary | ICD-10-CM | POA: Diagnosis present

## 2024-01-12 DIAGNOSIS — N183 Chronic kidney disease, stage 3 unspecified: Secondary | ICD-10-CM | POA: Insufficient documentation

## 2024-01-12 DIAGNOSIS — D649 Anemia, unspecified: Secondary | ICD-10-CM

## 2024-01-12 LAB — CMP (CANCER CENTER ONLY)
ALT: 11 U/L (ref 0–44)
AST: 21 U/L (ref 15–41)
Albumin: 3.4 g/dL — ABNORMAL LOW (ref 3.5–5.0)
Alkaline Phosphatase: 126 U/L (ref 38–126)
Anion gap: 8 (ref 5–15)
BUN: 33 mg/dL — ABNORMAL HIGH (ref 8–23)
CO2: 25 mmol/L (ref 22–32)
Calcium: 8.4 mg/dL — ABNORMAL LOW (ref 8.9–10.3)
Chloride: 103 mmol/L (ref 98–111)
Creatinine: 1.12 mg/dL — ABNORMAL HIGH (ref 0.44–1.00)
GFR, Estimated: 47 mL/min — ABNORMAL LOW (ref >60)
Glucose, Bld: 127 mg/dL — ABNORMAL HIGH (ref 70–99)
Potassium: 3.9 mmol/L (ref 3.5–5.1)
Sodium: 136 mmol/L (ref 135–145)
Total Bilirubin: 0.4 mg/dL (ref 0.0–1.2)
Total Protein: 6.9 g/dL (ref 6.5–8.1)

## 2024-01-12 LAB — CBC WITH DIFFERENTIAL (CANCER CENTER ONLY)
Abs Immature Granulocytes: 0.13 10*3/uL — ABNORMAL HIGH (ref 0.00–0.07)
Basophils Absolute: 0.2 10*3/uL — ABNORMAL HIGH (ref 0.0–0.1)
Basophils Relative: 3 %
Eosinophils Absolute: 0.1 10*3/uL (ref 0.0–0.5)
Eosinophils Relative: 1 %
HCT: 26.2 % — ABNORMAL LOW (ref 36.0–46.0)
Hemoglobin: 8.4 g/dL — ABNORMAL LOW (ref 12.0–15.0)
Immature Granulocytes: 2 %
Lymphocytes Relative: 27 %
Lymphs Abs: 1.9 10*3/uL (ref 0.7–4.0)
MCH: 30.9 pg (ref 26.0–34.0)
MCHC: 32.1 g/dL (ref 30.0–36.0)
MCV: 96.3 fL (ref 80.0–100.0)
Monocytes Absolute: 1.2 10*3/uL — ABNORMAL HIGH (ref 0.1–1.0)
Monocytes Relative: 17 %
Neutro Abs: 3.6 10*3/uL (ref 1.7–7.7)
Neutrophils Relative %: 50 %
Platelet Count: 297 10*3/uL (ref 150–400)
RBC: 2.72 MIL/uL — ABNORMAL LOW (ref 3.87–5.11)
RDW: 19.3 % — ABNORMAL HIGH (ref 11.5–15.5)
WBC Count: 7.2 10*3/uL (ref 4.0–10.5)
nRBC: 0 % (ref 0.0–0.2)

## 2024-01-12 LAB — LACTATE DEHYDROGENASE: LDH: 187 U/L (ref 98–192)

## 2024-01-12 LAB — SAMPLE TO BLOOD BANK

## 2024-01-12 MED ORDER — DARBEPOETIN ALFA 200 MCG/0.4ML IJ SOSY
200.0000 ug | PREFILLED_SYRINGE | Freq: Once | INTRAMUSCULAR | Status: AC
  Administered 2024-01-12: 200 ug via SUBCUTANEOUS
  Filled 2024-01-12: qty 0.4

## 2024-01-12 NOTE — Assessment & Plan Note (Addendum)
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  NEAGTIVE for JAK2 mutation; MPL; CALR mutation; PDGRA-NEGATIVE. JAN 2025-currently on INQOVI  [po decitabine ]- and aranesp    # currently cycle # 5  INQOVI  [po decitabine ]- and aranesp  today as Hb 8.4 Platelets- 155; However hold bone marrow given her age/ fraility- and the fact that there does not seems to have any hematologic response noted.    # Nausea/vomiting- ? Related to INQOVI -recommend antiemetics as needed- stable.   # back pain/spasm [Dr.Morales]- a/p epidural- currently on prn oxycodone - stable.   # GERD/reflux-Recommend NEXIUM over the counter prn- stable.   # Depression: Celexa  x1 - noted to have dizzy spells. Declined Remeron  15 mg nightly.  stable.   # CKD stage III [43]- [place] - APRIl 2023- NO hydronephrosis.  Continue on allopurinol  100 g twice a day.  GFR- 43 stable.   # DISPOSITION: # NO blood tomorrow # proceed with Aranesp - today # as planned- follow up in 5 week-MD labs- cbc/cmp; LDH;hold tube; aranesp  Possible D-2 1 unit PRBC-- Dr.B

## 2024-01-12 NOTE — Progress Notes (Signed)
 No concerns today

## 2024-01-12 NOTE — Progress Notes (Signed)
 [facility] OFFICE PROGRESS NOTE  Patient Care Team: Monique Ano, MD as PCP - General (Family Medicine) Gwyn Leos, MD as Consulting Physician (Oncology)   # HEMATOLOGY HISTORY:  # LEUCOCYTOSIS- [FEB 2023- PCP] WBC 35 [predominant monocytosis; mild lymphocytosis; moderate neutrophilia]; [name]; platelets- 180; [name], white count elevated.     RBC (Red Blood Cell Count) 4.04 - 5.48 10^6/uL 3.02 Low     Hemoglobin 12.0 - 15.0 gm/dL 16.1 Low     Hematocrit 35.0 - 47.0 % 31.9 Low     MCV (Mean Corpuscular Volume) 80.0 - 100.0 fl 105.6 High     MCH (Mean Corpuscular Hemoglobin) 27.0 - 31.2 pg 34.8 High     MCHC (Mean Corpuscular Hemoglobin Concentration) 32.0 - 36.0 gm/dL 09.6    Platelet Count 150 - 450 10^3/uL 180    RDW-CV (Red Cell Distribution Width) 11.6 - 14.8 % 14.5    MPV (Mean Platelet Volume) 9.4 - 12.4 fl 9.1 Low     Neutrophils 1.50 - 7.80 10^3/uL 16.28 High     Lymphocytes 1.00 - 3.60 10^3/uL 4.61 High     Monocytes 0.00 - 1.50 10^3/uL 16.92 High     Eosinophils 0.00 - 0.55 10^3/uL 0.48    Basophils 0.00 - 0.09 10^3/uL 0.07    Neutrophil % 32.0 - 70.0 % 41.2    Lymphocyte % 10.0 - 50.0 % 11.7    Monocyte % 4.0 - 13.0 % 42.8 High   See manual diff and path review.   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   Accompanied by daughter.   Amanda Rose 89 y.o.  female pleasant chronic myelomonocytic leukemia  currently on INQOVI ; and aranesp  is here for follow-up.   Good appetite, 1 boost per day. Patient denies any diarrhea. Complains of  chronic mild fatigue not any worse. No falls. No fever or chills.  Review of Systems  Constitutional:  Positive for malaise/fatigue. Negative for [name], palpitations, orthopnea and leg swelling.  Gastrointestinal:  Negative for blood in stool, constipation, diarrhea, heartburn, melena, nausea and vomiting.  Genitourinary:  Negative for dysuria, frequency and urgency.  Musculoskeletal:  Positive for joint pain.  Skin: Negative.  Negative for itching and rash.  Neurological:  Negative for dizziness, tingling, focal weakness, weakness and headaches.  Endo/Heme/Allergies:  Does not bruise/bleed easily.  Psychiatric/Behavioral:  Negative for depression. The patient is not nervous/anxious and does not have insomnia.       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)    Hypothyroidism    Lower extremity weakness    Pre-diabetes    Thyroid disease    Weakness of both legs     PAST SURGICAL HISTORY :   Past Surgical History:  Procedure Laterality  Date   IR KYPHO EA ADDL LEVEL THORACIC OR LUMBAR  12/17/2021   IR KYPHO THORACIC WITH BONE BIOPSY  12/17/2021   IR RADIOLOGIST EVAL & MGMT  12/09/2021   KYPHOPLASTY N/A 04/05/2021   Procedure: T10 and L2 KYPHOPLASTY;  Surgeon: Molli Angelucci, MD;  [name];  Service: Orthopedics;  [name];    FAMILY HISTORY :   Family History  Problem Relation Age of Onset   Cancer Father        unknown    SOCIAL HISTORY:   Social History   Tobacco Use   Smoking status: Never   Smokeless tobacco: Never  Vaping Use   Vaping status: Never Used  Substance Use Topics   Alcohol use: Never   Drug use: Never    ALLERGIES:  is allergic to other.  MEDICATIONS:  Current Outpatient Medications  Medication Sig Dispense Refill   allopurinol  (ZYLOPRIM ) 100 MG tablet TAKE 1 TABLET BY MOUTH TWICE A DAY 180 tablet 1   Cholecalciferol (VITAMIN D-3) 125 MCG (5000 UT) TABS Take 5,[name],000 mcg by mouth in the morning.     decitabine -cedazuridine  (INQOVI ) 35-100 MG oral tablet Take 1 tablet by mouth daily. Take for 5 days, hold for 23d, [name], at least 2 hr before or after food 5 tablet 3   levothyroxine (SYNTHROID) 100 MCG tablet Take 100 mcg by mouth daily before breakfast.     Magnesium 100 MG CAPS Take by mouth.     Multiple Vitamin (MULTIVITAMIN WITH MINERALS) TABS tablet Take 1 tablet by mouth in the morning. Equate Complete Multivitamin     ondansetron  (ZOFRAN -ODT) 4 MG disintegrating tablet Take 1 tablet (4 mg total) by mouth every 8 (eight) hours as needed. 90 tablet 1   No current facility-administered medications for this visit.    PHYSICAL EXAMINATION:  BP (!) 134/58 (BP Location: Left Arm, Patient Position: Sitting, Cuff Size: Normal)   Pulse 82   Temp 98.6 F (37 C) (Tympanic)   Resp 20   Ht 4\' 11"  (1.499 m)   Wt 108 lb (49 kg)   SpO2 97%   BMI 21.81  kg/m   Filed Weights   01/12/24 1500  Weight: 108 lb (49 kg)     Physical Exam Vitals and nursing note reviewed.  HENT:     Head: Normocephalic and atraumatic.     Mouth/Throat:     Pharynx: Oropharynx is clear.  Eyes:     Extraocular Movements: Extraocular movements intact.     Pupils: Pupils are equal, round, and reactive to light.  Cardiovascular:     Rate and Rhythm: Normal rate and regular rhythm.  Pulmonary:     Comments: Decreased breath  sounds bilaterally.  Abdominal:     Palpations: Abdomen is soft.  Musculoskeletal:        General: Normal range of motion.     Cervical back: Normal range of motion.  Skin:    General: Skin is warm.  Neurological:     General: No focal deficit present.     Mental Status: She is alert and oriented to person, place, and time.  Psychiatric:        Behavior: Behavior normal.        Judgment: Judgment normal.        LABORATORY DATA:  I have reviewed the data as listed    Component Value Date/Time   NA 136 01/12/2024 1446   K 3.9 01/12/2024 1446   CL 103 01/12/2024 1446   CO2 25 01/12/2024 1446   GLUCOSE 127 (H) 01/12/2024 1446   BUN 33 (H) 01/12/2024 1446   CREATININE 1.12 (H) 01/12/2024 1446   CALCIUM 8.4 (L) 01/12/2024 1446   PROT 6.9 01/12/2024 1446   ALBUMIN 3.4 (L) 01/12/2024 1446   AST 21 01/12/2024 1446   ALT 11 01/12/2024 1446   ALKPHOS 126 01/12/2024 1446   BILITOT 0.4 01/12/2024 1446   GFRNONAA 47 (L) 01/12/2024 1446   GFRAA >60 12/03/2017 1003    No results found for: "SPEP", "UPEP"  Lab Results  Component Value Date   WBC 7.2 01/12/2024   NEUTROABS 3.6 01/12/2024   HGB 8.4 (L) 01/12/2024   HCT 26.2 (L) 01/12/2024   MCV 96.3 01/12/2024   PLT 297 01/12/2024      Chemistry      Component Value Date/Time   NA 136 01/12/2024 1446   K 3.9 01/12/2024 1446   CL 103 01/12/2024 1446   CO2 25 01/12/2024 1446   BUN 33 (H) 01/12/2024 1446   CREATININE 1.12 (H) 01/12/2024 1446      Component Value  Date/Time   CALCIUM 8.4 (L) 01/12/2024 1446   ALKPHOS 126 01/12/2024 1446   AST 21 01/12/2024 1446   ALT 11 01/12/2024 1446   BILITOT 0.4 01/12/2024 1446       RADIOGRAPHIC STUDIES: I have personally reviewed the radiological images as listed and agreed with the findings in the report. No results found.    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  NEAGTIVE for JAK2 mutation; MPL; CALR mutation; PDGRA-NEGATIVE. JAN 2025-currently on INQOVI  [po decitabine ]- and aranesp    # currently cycle # 5  INQOVI  [po decitabine ]- and aranesp  today as Hb 8.4 Platelets- 155; However hold bone marrow given her age/ fraility- and the fact that there does not seems to have any hematologic response noted.    # Nausea/vomiting- ? Related to INQOVI -recommend antiemetics as needed- stable.   # back pain/spasm [Dr.Morales]- a/p epidural- currently on prn oxycodone - stable.   # GERD/reflux-Recommend NEXIUM over the counter prn- stable.   # Depression: Celexa  x1 - noted to have dizzy spells. Declined Remeron  15 mg nightly.  stable.   # CKD stage III [43]- [place] - APRIl 2023- NO hydronephrosis.  Continue on allopurinol  100 g twice a day.  GFR- 43 stable.   # DISPOSITION: # NO blood tomorrow # proceed with Aranesp - today # as planned- follow up in 5 week-MD labs- cbc/cmp; LDH;hold tube; aranesp  Possible D-2 1 unit PRBC-- Dr.B  Orders Placed This Encounter  Procedures   CMP (Cancer Center only)    Standing Status:   Future    Expected Date:   02/16/2024    Expiration Date:   01/11/2025   Lactate dehydrogenase    Standing Status:   Future    Expected Date:   02/16/2024    Expiration Date:   01/11/2025   Sample to  Blood Bank    Standing Status:   Future    Expected Date:   02/16/2024    Expiration Date:   01/11/2025   All questions were answered. The patient knows to call the clinic with any problems, questions or concerns.      Gwyn Leos, MD 01/12/2024 3:57 PM

## 2024-01-13 ENCOUNTER — Inpatient Hospital Stay

## 2024-01-27 ENCOUNTER — Other Ambulatory Visit: Payer: Self-pay

## 2024-01-27 NOTE — Progress Notes (Signed)
 Specialty Pharmacy Refill Coordination Note  Amanda Rose is a 89 y.o. female contacted today regarding refills of specialty medication(s) Decitabine -Cedazuridine  (Inqovi )   Patient requested (Proxy-Rptd) Delivery   Delivery date: (Proxy-Rptd) 02/11/24   Verified address: (Proxy-Rptd) [address] [place],  [address]   Medication will be filled on 02/10/24.

## 2024-02-15 ENCOUNTER — Other Ambulatory Visit: Payer: Self-pay | Admitting: *Deleted

## 2024-02-15 DIAGNOSIS — C9310 Chronic myelomonocytic leukemia not having achieved remission: Secondary | ICD-10-CM

## 2024-02-16 ENCOUNTER — Inpatient Hospital Stay (HOSPITAL_BASED_OUTPATIENT_CLINIC_OR_DEPARTMENT_OTHER): Admitting: Internal Medicine

## 2024-02-16 ENCOUNTER — Inpatient Hospital Stay

## 2024-02-16 ENCOUNTER — Inpatient Hospital Stay: Attending: Internal Medicine

## 2024-02-16 ENCOUNTER — Encounter: Payer: Self-pay | Admitting: Internal Medicine

## 2024-02-16 VITALS — BP 122/65 | HR 84 | Temp 97.4°F | Resp 16 | Ht 59.0 in | Wt 107.9 lb

## 2024-02-16 DIAGNOSIS — N1831 Chronic kidney disease, stage 3a: Secondary | ICD-10-CM | POA: Diagnosis present

## 2024-02-16 DIAGNOSIS — C9310 Chronic myelomonocytic leukemia not having achieved remission: Secondary | ICD-10-CM

## 2024-02-16 DIAGNOSIS — D631 Anemia in chronic kidney disease: Secondary | ICD-10-CM | POA: Diagnosis present

## 2024-02-16 DIAGNOSIS — D649 Anemia, unspecified: Secondary | ICD-10-CM

## 2024-02-16 LAB — CBC WITH DIFFERENTIAL (CANCER CENTER ONLY)
Abs Immature Granulocytes: 0.01 10*3/uL (ref 0.00–0.07)
Basophils Absolute: 0.2 10*3/uL — ABNORMAL HIGH (ref 0.0–0.1)
Basophils Relative: 6 %
Eosinophils Absolute: 0.1 10*3/uL (ref 0.0–0.5)
Eosinophils Relative: 3 %
HCT: 24.4 % — ABNORMAL LOW (ref 36.0–46.0)
Hemoglobin: 8 g/dL — ABNORMAL LOW (ref 12.0–15.0)
Immature Granulocytes: 0 %
Lymphocytes Relative: 44 %
Lymphs Abs: 1.4 10*3/uL (ref 0.7–4.0)
MCH: 31.5 pg (ref 26.0–34.0)
MCHC: 32.8 g/dL (ref 30.0–36.0)
MCV: 96.1 fL (ref 80.0–100.0)
Monocytes Absolute: 0.6 10*3/uL (ref 0.1–1.0)
Monocytes Relative: 16 %
Neutro Abs: 1.1 10*3/uL — ABNORMAL LOW (ref 1.7–7.7)
Neutrophils Relative %: 31 %
Platelet Count: 181 10*3/uL (ref 150–400)
RBC: 2.54 MIL/uL — ABNORMAL LOW (ref 3.87–5.11)
RDW: 18.6 % — ABNORMAL HIGH (ref 11.5–15.5)
WBC Count: 3.4 10*3/uL — ABNORMAL LOW (ref 4.0–10.5)
nRBC: 0 % (ref 0.0–0.2)

## 2024-02-16 LAB — CMP (CANCER CENTER ONLY)
ALT: 10 U/L (ref 0–44)
AST: 18 U/L (ref 15–41)
Albumin: 3.5 g/dL (ref 3.5–5.0)
Alkaline Phosphatase: 128 U/L — ABNORMAL HIGH (ref 38–126)
Anion gap: 6 (ref 5–15)
BUN: 30 mg/dL — ABNORMAL HIGH (ref 8–23)
CO2: 23 mmol/L (ref 22–32)
Calcium: 8.3 mg/dL — ABNORMAL LOW (ref 8.9–10.3)
Chloride: 105 mmol/L (ref 98–111)
Creatinine: 1.1 mg/dL — ABNORMAL HIGH (ref 0.44–1.00)
GFR, Estimated: 48 mL/min — ABNORMAL LOW
Glucose, Bld: 111 mg/dL — ABNORMAL HIGH (ref 70–99)
Potassium: 4.3 mmol/L (ref 3.5–5.1)
Sodium: 134 mmol/L — ABNORMAL LOW (ref 135–145)
Total Bilirubin: 0.9 mg/dL (ref 0.0–1.2)
Total Protein: 7 g/dL (ref 6.5–8.1)

## 2024-02-16 LAB — SAMPLE TO BLOOD BANK

## 2024-02-16 LAB — LACTATE DEHYDROGENASE: LDH: 172 U/L (ref 98–192)

## 2024-02-16 MED ORDER — DARBEPOETIN ALFA 200 MCG/0.4ML IJ SOSY
200.0000 ug | PREFILLED_SYRINGE | Freq: Once | INTRAMUSCULAR | Status: AC
  Administered 2024-02-16: 200 ug via SUBCUTANEOUS
  Filled 2024-02-16: qty 0.4

## 2024-02-16 NOTE — Progress Notes (Unsigned)
 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, 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   Accompanied by daughter.   Amanda Rose 89 y.o.  female pleasant chronic myelomonocytic leukemia  currently on INQOVI ; and aranesp  is here for follow-up.  Pt in for follow up, reports doing well, denies any difficulties or concern. Complains of  chronic mild fatigue not any worse.    Good appetite, 1 boost per day. Patient denies any diarrhea. No falls. No fever or chills.  Review of Systems  Constitutional:  Positive for malaise/fatigue. Negative for [name], palpitations, orthopnea and leg swelling.  Gastrointestinal:  Negative for blood in stool, constipation, diarrhea, heartburn, melena, nausea and vomiting.  Genitourinary:  Negative for dysuria, frequency and urgency.  Musculoskeletal:  Positive for joint pain.  Skin: Negative.  Negative for itching and rash.  Neurological:  Negative for dizziness, tingling, focal weakness, weakness and headaches.  Endo/Heme/Allergies:  Does not bruise/bleed easily.  Psychiatric/Behavioral:  Negative for depression. The patient is not nervous/anxious and does not have insomnia.       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)    Hypothyroidism    Lower extremity weakness    Pre-diabetes    Thyroid disease    Weakness of both legs      PAST SURGICAL HISTORY :   Past Surgical History:  Procedure Laterality Date   IR KYPHO EA ADDL LEVEL THORACIC OR LUMBAR  12/17/2021   IR KYPHO THORACIC WITH BONE BIOPSY  12/17/2021   IR RADIOLOGIST EVAL & MGMT  12/09/2021   KYPHOPLASTY N/A 04/05/2021   Procedure: T10 and L2 KYPHOPLASTY;  Surgeon: Molli Angelucci, MD;  [name];  Service: Orthopedics;  [name];    FAMILY HISTORY :   Family History  Problem Relation Age of Onset   Cancer Father        unknown    SOCIAL HISTORY:   Social History   Tobacco Use   Smoking status: Never   Smokeless tobacco: Never  Vaping Use   Vaping status: Never Used  Substance Use Topics   Alcohol use: Never   Drug use: Never    ALLERGIES:  is allergic to other.  MEDICATIONS:  Current Outpatient Medications  Medication Sig Dispense Refill   allopurinol  (ZYLOPRIM ) 100 MG tablet TAKE 1 TABLET BY MOUTH TWICE A DAY 180 tablet 1   Cholecalciferol (VITAMIN D-3) 125 MCG (5000 UT) TABS Take 5,[name],000 mcg by mouth in the morning.     decitabine -cedazuridine  (INQOVI ) 35-100 MG oral tablet Take 1 tablet by mouth daily. Take for 5 days, hold for 23d, [name], at least 2 hr before or after food 5 tablet 3   levothyroxine (SYNTHROID) 100 MCG tablet Take 100 mcg by mouth daily before breakfast.     Magnesium 100 MG CAPS Take 100 mg by mouth 2 times daily at 12 noon and 4 pm.     Multiple Vitamin (MULTIVITAMIN WITH MINERALS) TABS tablet Take 1 tablet by mouth in the morning. Equate Complete Multivitamin     ondansetron  (ZOFRAN -ODT) 4 MG disintegrating tablet Take 1 tablet (4 mg total) by mouth every 8 (eight) hours as needed. (Patient not taking: Reported on 02/16/2024) 90 tablet 1   No current facility-administered medications for this visit.   Facility-Administered Medications Ordered in Other Visits  Medication Dose  Route Frequency Provider Last Rate Last Admin   Darbepoetin Alfa  (ARANESP ) injection 200 mcg  200 mcg Subcutaneous Once Perri Aragones R, MD        PHYSICAL EXAMINATION:  BP 122/65 (BP Location: Left Arm, Patient Position: Sitting)   Pulse 84   Temp (!) 97.4 F (36.3 C) (Tympanic)   Resp 16   Ht 4\' 11"  (1.499 m)   Wt 107 lb 14.4 oz (48.9 kg)   SpO2 96%   BMI 21.79 kg/m   Filed Weights   02/16/24 1516  Weight: 107 lb 14.4 oz (48.9 kg)     Physical Exam Vitals and  nursing note reviewed.  HENT:     Head: Normocephalic and atraumatic.     Mouth/Throat:     Pharynx: Oropharynx is clear.  Eyes:     Extraocular Movements: Extraocular movements intact.     Pupils: Pupils are equal, round, and reactive to light.  Cardiovascular:     Rate and Rhythm: Normal rate and regular rhythm.  Pulmonary:     Comments: Decreased breath sounds bilaterally.  Abdominal:     Palpations: Abdomen is soft.  Musculoskeletal:        General: Normal range of motion.     Cervical back: Normal range of motion.  Skin:    General: Skin is warm.  Neurological:     General: No focal deficit present.     Mental Status: She is alert and oriented to person, place, and time.  Psychiatric:        Behavior: Behavior normal.        Judgment: Judgment normal.        LABORATORY DATA:  I have reviewed the data as listed    Component Value Date/Time   NA 134 (L) 02/16/2024 1437   K 4.3 02/16/2024 1437   CL 105 02/16/2024 1437   CO2 23 02/16/2024 1437   GLUCOSE 111 (H) 02/16/2024 1437   BUN 30 (H) 02/16/2024 1437   CREATININE 1.10 (H) 02/16/2024 1437   CALCIUM 8.3 (L) 02/16/2024 1437   PROT 7.0 02/16/2024 1437   ALBUMIN 3.5 02/16/2024 1437   AST 18 02/16/2024 1437   ALT 10 02/16/2024 1437   ALKPHOS 128 (H) 02/16/2024 1437   BILITOT 0.9 02/16/2024 1437   GFRNONAA 48 (L) 02/16/2024 1437   GFRAA >60 12/03/2017 1003    No results found for: "SPEP", "UPEP"  Lab Results  Component Value  Date   WBC 3.4 (L) 02/16/2024   NEUTROABS 1.1 (L) 02/16/2024   HGB 8.0 (L) 02/16/2024   HCT 24.4 (L) 02/16/2024   MCV 96.1 02/16/2024   PLT 181 02/16/2024      Chemistry      Component Value Date/Time   NA 134 (L) 02/16/2024 1437   K 4.3 02/16/2024 1437   CL 105 02/16/2024 1437   CO2 23 02/16/2024 1437   BUN 30 (H) 02/16/2024 1437   CREATININE 1.10 (H) 02/16/2024 1437      Component Value Date/Time   CALCIUM 8.3 (L) 02/16/2024 1437   ALKPHOS 128 (H) 02/16/2024 1437   AST 18 02/16/2024 1437   ALT 10 02/16/2024 1437   BILITOT 0.9 02/16/2024 1437       RADIOGRAPHIC STUDIES: I have personally reviewed the radiological images as listed and agreed with the findings in the report. No results found.    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  NEAGTIVE for JAK2 mutation; MPL; CALR mutation; PDGRA-NEGATIVE. [14th JAN 2025- INQOVI  [po decitabine ]- and aranesp    # Proceed with #6  INQOVI  [po decitabine ]- and aranesp  today as Hb 8.0 Platelets- 181;ANC 1.0-  mild hematolgic response is noted- HOLD bone marrow given her age/ fraility/pt preference-  # Nausea/vomiting- ? Related to INQOVI -recommend antiemetics as needed- stable.   # back pain/spasm [Dr.Morales]- a/p epidural- currently on prn oxycodone - stable.   # GERD/reflux-Recommend NEXIUM over the counter prn- stable.   # Depression: Celexa  x1 - noted  to have dizzy spells. Declined Remeron  15 mg nightly.  stable.   # CKD stage III [43]- US - APRIl 2023- NO hydronephrosis.  Continue on allopurinol  100 g twice a day.  GFR- 43 -  stable.   # DISPOSITION: # NO blood tomorrow # proceed with Aranesp - today # as planned- follow up in 4   week-MD labs- cbc/cmp; LDH;hold tube; aranesp  Possible D-2 1 unit PRBC-- Dr.B   No orders of the defined types were placed in this encounter.  All questions were answered. The patient knows to call the clinic with any problems, questions or concerns.      Gwyn Leos, MD 02/16/2024 3:30 PM

## 2024-02-16 NOTE — Assessment & Plan Note (Addendum)
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  NEAGTIVE for JAK2 mutation; MPL; CALR mutation; PDGRA-NEGATIVE. [14th JAN 2025- INQOVI  [po decitabine ]- and aranesp    # Proceed with #6  INQOVI  [po decitabine ]- and aranesp  today as Hb 8.0 Platelets- 181;ANC 1.0-  mild hematolgic response is noted- HOLD bone marrow given her age/ fraility/pt preference-  # Nausea/vomiting- ? Related to INQOVI -recommend antiemetics as needed- stable.   # back pain/spasm [Dr.Morales]- a/p epidural- currently on prn oxycodone - stable.   # GERD/reflux-Recommend NEXIUM over the counter prn- stable.   # Depression: Celexa  x1 - noted to have dizzy spells. Declined Remeron  15 mg nightly.  stable.   # CKD stage III [43]- US - APRIl 2023- NO hydronephrosis.  Continue on allopurinol  100 g twice a day.  GFR- 43 -  stable.   # DISPOSITION: # NO blood tomorrow # proceed with Aranesp - today # as planned- follow up in 4  week-MD labs- cbc/cmp; LDH;hold tube; aranesp  Possible D-2 1 unit PRBC-- Dr.B

## 2024-02-16 NOTE — Progress Notes (Unsigned)
 Pt in for follow up, reports doing well, denies any difficulties or concerns.

## 2024-02-17 ENCOUNTER — Inpatient Hospital Stay

## 2024-02-17 ENCOUNTER — Encounter: Payer: Self-pay | Admitting: Internal Medicine

## 2024-03-07 ENCOUNTER — Other Ambulatory Visit: Payer: Self-pay

## 2024-03-07 NOTE — Progress Notes (Signed)
 Specialty Pharmacy Refill Coordination Note  Amanda Rose is a 89 y.o. female contacted today regarding refills of specialty medication(s) Decitabine -Cedazuridine  (Inqovi )   Patient requested Delivery   Delivery date: 03/09/24   Verified address: [address] [place], [place] [address]   Medication will be filled on 03/08/24.

## 2024-03-08 ENCOUNTER — Other Ambulatory Visit: Payer: Self-pay

## 2024-03-16 ENCOUNTER — Inpatient Hospital Stay

## 2024-03-16 ENCOUNTER — Inpatient Hospital Stay: Attending: Internal Medicine

## 2024-03-16 ENCOUNTER — Emergency Department
Admission: EM | Admit: 2024-03-16 | Discharge: 2024-03-17 | Disposition: A | Discharge status: Home / Self Care | Attending: Emergency Medicine | Admitting: Emergency Medicine

## 2024-03-16 ENCOUNTER — Telehealth: Payer: Self-pay | Admitting: *Deleted

## 2024-03-16 ENCOUNTER — Inpatient Hospital Stay (HOSPITAL_BASED_OUTPATIENT_CLINIC_OR_DEPARTMENT_OTHER): Admitting: Internal Medicine

## 2024-03-16 ENCOUNTER — Other Ambulatory Visit: Payer: Self-pay

## 2024-03-16 ENCOUNTER — Encounter: Payer: Self-pay | Admitting: Internal Medicine

## 2024-03-16 VITALS — BP 120/53 | HR 82 | Temp 98.6°F | Resp 16 | Ht 59.0 in | Wt 111.5 lb

## 2024-03-16 DIAGNOSIS — Z79899 Other long term (current) drug therapy: Secondary | ICD-10-CM | POA: Insufficient documentation

## 2024-03-16 DIAGNOSIS — R531 Weakness: Secondary | ICD-10-CM | POA: Diagnosis present

## 2024-03-16 DIAGNOSIS — D631 Anemia in chronic kidney disease: Secondary | ICD-10-CM | POA: Diagnosis present

## 2024-03-16 DIAGNOSIS — N1831 Chronic kidney disease, stage 3a: Secondary | ICD-10-CM | POA: Insufficient documentation

## 2024-03-16 DIAGNOSIS — Z856 Personal history of leukemia: Secondary | ICD-10-CM | POA: Insufficient documentation

## 2024-03-16 DIAGNOSIS — D649 Anemia, unspecified: Secondary | ICD-10-CM | POA: Insufficient documentation

## 2024-03-16 DIAGNOSIS — R5383 Other fatigue: Secondary | ICD-10-CM | POA: Diagnosis present

## 2024-03-16 DIAGNOSIS — R600 Localized edema: Secondary | ICD-10-CM | POA: Insufficient documentation

## 2024-03-16 DIAGNOSIS — C9310 Chronic myelomonocytic leukemia not having achieved remission: Secondary | ICD-10-CM | POA: Diagnosis present

## 2024-03-16 LAB — PROTIME-INR
INR: 1.1 (ref 0.8–1.2)
Prothrombin Time: 14.6 s (ref 11.4–15.2)

## 2024-03-16 LAB — CBC
HCT: 16.8 % — ABNORMAL LOW (ref 36.0–46.0)
Hemoglobin: 5.5 g/dL — ABNORMAL LOW (ref 12.0–15.0)
MCH: 32.2 pg (ref 26.0–34.0)
MCHC: 32.7 g/dL (ref 30.0–36.0)
MCV: 98.2 fL (ref 80.0–100.0)
Platelets: 244 K/uL (ref 150–400)
RBC: 1.71 MIL/uL — ABNORMAL LOW (ref 3.87–5.11)
RDW: 18 % — ABNORMAL HIGH (ref 11.5–15.5)
WBC: 2.5 K/uL — ABNORMAL LOW (ref 4.0–10.5)
nRBC: 0 % (ref 0.0–0.2)

## 2024-03-16 LAB — SAMPLE TO BLOOD BANK

## 2024-03-16 LAB — COMPREHENSIVE METABOLIC PANEL WITH GFR
ALT: 10 U/L (ref 0–44)
AST: 15 U/L (ref 15–41)
Albumin: 3.5 g/dL (ref 3.5–5.0)
Alkaline Phosphatase: 146 U/L — ABNORMAL HIGH (ref 38–126)
Anion gap: 9 (ref 5–15)
BUN: 28 mg/dL — ABNORMAL HIGH (ref 8–23)
CO2: 24 mmol/L (ref 22–32)
Calcium: 8.8 mg/dL — ABNORMAL LOW (ref 8.9–10.3)
Chloride: 105 mmol/L (ref 98–111)
Creatinine, Ser: 0.95 mg/dL (ref 0.44–1.00)
GFR, Estimated: 57 mL/min — ABNORMAL LOW (ref >60)
Glucose, Bld: 93 mg/dL (ref 70–99)
Potassium: 4.3 mmol/L (ref 3.5–5.1)
Sodium: 138 mmol/L (ref 135–145)
Total Bilirubin: 0.6 mg/dL (ref 0.0–1.2)
Total Protein: 6.8 g/dL (ref 6.5–8.1)

## 2024-03-16 LAB — CMP (CANCER CENTER ONLY)
ALT: 11 U/L (ref 0–44)
AST: 17 U/L (ref 15–41)
Albumin: 3.4 g/dL — ABNORMAL LOW (ref 3.5–5.0)
Alkaline Phosphatase: 145 U/L — ABNORMAL HIGH (ref 38–126)
Anion gap: 6 (ref 5–15)
BUN: 29 mg/dL — ABNORMAL HIGH (ref 8–23)
CO2: 24 mmol/L (ref 22–32)
Calcium: 8.4 mg/dL — ABNORMAL LOW (ref 8.9–10.3)
Chloride: 105 mmol/L (ref 98–111)
Creatinine: 1.08 mg/dL — ABNORMAL HIGH (ref 0.44–1.00)
GFR, Estimated: 49 mL/min — ABNORMAL LOW (ref >60)
Glucose, Bld: 108 mg/dL — ABNORMAL HIGH (ref 70–99)
Potassium: 4.2 mmol/L (ref 3.5–5.1)
Sodium: 135 mmol/L (ref 135–145)
Total Bilirubin: 0.6 mg/dL (ref 0.0–1.2)
Total Protein: 6.6 g/dL (ref 6.5–8.1)

## 2024-03-16 LAB — CBC WITH DIFFERENTIAL (CANCER CENTER ONLY)
Abs Immature Granulocytes: 0.02 K/uL (ref 0.00–0.07)
Basophils Absolute: 0 K/uL (ref 0.0–0.1)
Basophils Relative: 0 %
Eosinophils Absolute: 0 K/uL (ref 0.0–0.5)
Eosinophils Relative: 0 %
HCT: 15.2 % — ABNORMAL LOW (ref 36.0–46.0)
Hemoglobin: 5.1 g/dL — CL (ref 12.0–15.0)
Immature Granulocytes: 1 %
Lymphocytes Relative: 52 %
Lymphs Abs: 1.2 K/uL (ref 0.7–4.0)
MCH: 32.5 pg (ref 26.0–34.0)
MCHC: 33.6 g/dL (ref 30.0–36.0)
MCV: 96.8 fL (ref 80.0–100.0)
Monocytes Absolute: 0.4 K/uL (ref 0.1–1.0)
Monocytes Relative: 17 %
Neutro Abs: 0.7 K/uL — ABNORMAL LOW (ref 1.7–7.7)
Neutrophils Relative %: 30 %
Platelet Count: 239 K/uL (ref 150–400)
RBC: 1.57 MIL/uL — ABNORMAL LOW (ref 3.87–5.11)
RDW: 18.1 % — ABNORMAL HIGH (ref 11.5–15.5)
WBC Count: 2.3 K/uL — ABNORMAL LOW (ref 4.0–10.5)
nRBC: 0 % (ref 0.0–0.2)

## 2024-03-16 LAB — PREPARE RBC (CROSSMATCH)

## 2024-03-16 LAB — LACTATE DEHYDROGENASE: LDH: 145 U/L (ref 98–192)

## 2024-03-16 MED ORDER — SODIUM CHLORIDE 0.9 % IV SOLN
10.0000 mL/h | Freq: Once | INTRAVENOUS | Status: AC
  Administered 2024-03-16: 10 mL/h via INTRAVENOUS

## 2024-03-16 MED ORDER — FUROSEMIDE 10 MG/ML IJ SOLN
40.0000 mg | Freq: Once | INTRAMUSCULAR | Status: AC
  Administered 2024-03-16: 40 mg via INTRAVENOUS
  Filled 2024-03-16: qty 4

## 2024-03-16 NOTE — Progress Notes (Signed)
 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, white count elevated.     RBC (Red Blood Cell Count) 4.04 - 5.48 10^6/uL 3.02 Low     Hemoglobin 12.0 - 15.0 gm/dL 89.4 Low     Hematocrit 35.0 - 47.0 % 31.9 Low     MCV (Mean Corpuscular Volume) 80.0 - 100.0 fl 105.6 High     MCH (Mean Corpuscular Hemoglobin) 27.0 - 31.2 pg 34.8 High     MCHC (Mean Corpuscular Hemoglobin Concentration) 32.0 - 36.0 gm/dL 67.0    Platelet Count 150 - 450 10^3/uL 180    RDW-CV (Red Cell Distribution Width) 11.6 - 14.8 % 14.5    MPV (Mean Platelet Volume) 9.4 - 12.4 fl 9.1 Low     Neutrophils 1.50 - 7.80 10^3/uL 16.28 High     Lymphocytes 1.00 - 3.60 10^3/uL 4.61 High     Monocytes 0.00 - 1.50 10^3/uL 16.92 High     Eosinophils 0.00 - 0.55 10^3/uL 0.48    Basophils 0.00 - 0.09 10^3/uL 0.07    Neutrophil % 32.0 - 70.0 % 41.2    Lymphocyte % 10.0 - 50.0 % 11.7    Monocyte % 4.0 - 13.0 % 42.8 High   See manual diff and path review.   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   Accompanied by daughter.   Amanda Rose 89 y.o.  female pleasant chronic myelomonocytic leukemia  currently on INQOVI ; and aranesp  is here for follow-up.  Patient noted to have worsening swelling of the legs mostly ankles for the last 1 week or so.  Patient started to have worsening fatigue/shortness of breath especially with exertion in the last 1 to 2 weeks.      Good appetite, 1 boost per day. Patient denies any diarrhea. No fever or chills.  Review of Systems  Constitutional:  Positive for malaise/fatigue. Negative for [name], palpitations, orthopnea and leg swelling.  Gastrointestinal:  Negative for blood in stool, constipation, diarrhea, heartburn, melena, nausea and vomiting.  Genitourinary:  Negative for dysuria, frequency and urgency.  Musculoskeletal:  Positive for joint pain.  Skin: Negative.  Negative for itching and rash.  Neurological:  Negative for dizziness, tingling, focal weakness, weakness and headaches.  Endo/Heme/Allergies:  Does not bruise/bleed easily.  Psychiatric/Behavioral:  Negative for depression. The patient is not nervous/anxious and does not have insomnia.       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)    Hypothyroidism    Lower  extremity weakness    Pre-diabetes    Thyroid disease    Weakness of both legs     PAST SURGICAL HISTORY :   Past Surgical History:  Procedure Laterality Date   IR KYPHO EA ADDL LEVEL THORACIC OR LUMBAR  12/17/2021   IR KYPHO THORACIC WITH BONE BIOPSY  12/17/2021   IR RADIOLOGIST EVAL & MGMT  12/09/2021   KYPHOPLASTY N/A 04/05/2021   Procedure: T10 and L2 KYPHOPLASTY;  Surgeon: Kathlynn Sharper, MD;  [name];  Service: Orthopedics;  [name];    FAMILY HISTORY :   Family History  Problem Relation Age of Onset   Cancer Father        unknown    SOCIAL HISTORY:   Social History   Tobacco Use   Smoking status: Never   Smokeless tobacco: Never  Vaping Use   Vaping status: Never Used  Substance Use Topics   Alcohol use: Never   Drug use: Never    ALLERGIES:  is allergic to other.  MEDICATIONS:  Current Outpatient Medications  Medication Sig Dispense Refill   allopurinol  (ZYLOPRIM ) 100 MG tablet TAKE 1 TABLET BY MOUTH TWICE A DAY 180 tablet 1   Cholecalciferol (VITAMIN D-3) 125 MCG (5000 UT) TABS Take 5,[name],000 mcg by mouth in the morning.     decitabine -cedazuridine  (INQOVI ) 35-100 MG oral tablet Take 1 tablet by mouth daily. Take for 5 days, hold for 23d, [name], at least 2 hr before or after food 5 tablet 3   levothyroxine (SYNTHROID) 100 MCG tablet Take 100 mcg by mouth daily before breakfast.     Magnesium 100 MG CAPS Take 100 mg by mouth 2 times daily at 12 noon and 4 pm.     Multiple Vitamin (MULTIVITAMIN WITH MINERALS) TABS tablet Take 1 tablet by mouth in the morning. Equate Complete Multivitamin     ondansetron  (ZOFRAN -ODT) 4 MG disintegrating tablet Take 1 tablet (4 mg total) by mouth every 8 (eight) hours as needed. (Patient not taking: Reported on 03/16/2024) 90 tablet 1   No current facility-administered medications for this visit.     PHYSICAL EXAMINATION:  BP (!) 120/53 (BP Location: Left Arm, Patient Position: Sitting, Cuff Size: Normal)   Pulse 82   Temp 98.6 F (37 C) (Tympanic)   Resp 16   Ht 4' 11 (1.499 m)   Wt 111 lb 8 oz (50.6 kg)   SpO2 97%   BMI 22.52 kg/m   Filed Weights   03/16/24 1418  Weight: 111 lb 8 oz (50.6 kg)   Mild bilateral ankle edema.  Mild crackles noted bilateral lower lung fields.  Physical Exam Vitals and nursing note reviewed.  HENT:     Head:  Normocephalic and atraumatic.     Mouth/Throat:     Pharynx: Oropharynx is clear.  Eyes:     Extraocular Movements: Extraocular movements intact.     Pupils: Pupils are equal, round, and reactive to light.  Cardiovascular:     Rate and Rhythm: Normal rate and regular rhythm.  Pulmonary:     Comments: Decreased breath sounds bilaterally.  Abdominal:     Palpations: Abdomen is soft.  Musculoskeletal:        General: Normal range of motion.     Cervical back: Normal range of motion.  Skin:    General: Skin is warm.  Neurological:     General: No focal deficit present.     Mental Status: She is alert and oriented to person, place, and time.  Psychiatric:        Behavior: Behavior normal.        Judgment: Judgment normal.       LABORATORY DATA:  I have reviewed the data as listed    Component Value Date/Time   NA 135 03/16/2024 1400   K 4.2 03/16/2024 1400   CL 105 03/16/2024 1400   CO2 24 03/16/2024 1400   GLUCOSE 108 (H) 03/16/2024 1400   BUN 29 (H) 03/16/2024 1400   CREATININE 1.08 (H) 03/16/2024 1400   CALCIUM 8.4 (L) 03/16/2024 1400   PROT 6.6 03/16/2024 1400   ALBUMIN 3.4 (L) 03/16/2024 1400   AST 17 03/16/2024 1400   ALT 11 03/16/2024 1400   ALKPHOS 145 (H) 03/16/2024 1400   BILITOT 0.6 03/16/2024 1400   GFRNONAA 49 (L) 03/16/2024 1400   GFRAA >60 12/03/2017 1003    No results found for: SPEP, UPEP  Lab Results  Component Value Date   WBC 2.3 (L) 03/16/2024   NEUTROABS 0.7 (L) 03/16/2024    HGB 5.1 (LL) 03/16/2024   HCT 15.2 (L) 03/16/2024   MCV 96.8 03/16/2024   PLT 239 03/16/2024      Chemistry      Component Value Date/Time   NA 135 03/16/2024 1400   K 4.2 03/16/2024 1400   CL 105 03/16/2024 1400   CO2 24 03/16/2024 1400   BUN 29 (H) 03/16/2024 1400   CREATININE 1.08 (H) 03/16/2024 1400      Component Value Date/Time   CALCIUM 8.4 (L) 03/16/2024 1400   ALKPHOS 145 (H) 03/16/2024 1400   AST 17 03/16/2024 1400   ALT 11 03/16/2024 1400   BILITOT 0.6 03/16/2024 1400       RADIOGRAPHIC STUDIES: I have personally reviewed the radiological images as listed and agreed with the findings in the report. No results found.    ASSESSMENT & PLAN:  Chronic myelomonocytic leukemia not having achieved remission [facility]) #Chronic myelomonocytic leukemia- April 2023-bone marrow biopsy: Hypercellular marrow with myeloid hyperplasia and monocytosis worrisome for a myeloid  proliferative/myelodysplastic syndrome, specifically chronic  myelomonocytic leukemia; myeloid panel.  NEAGTIVE for JAK2 mutation; MPL; CALR mutation; PDGRA-NEGATIVE. [14th JAN 2025- INQOVI  [po decitabine ]- and aranesp    # s/p  #6 INQOVI  [po decitabine ]- finished 3 weeks ago.  Please hemoglobin is 5.1; white count 2.3 ANC 0.6; platelets normal.  I suspect/clinically suggestive of bone marrow suppression from Inqovi  rather than progressive leukemia. HOLD aranesp  200mcg [July 2025-increase to 300].  For now given low clinical concerns of progressive leukemia HOLD bone marrow given her age/ fraility/pt preference-recommend to insert PRBC blood transfusion in the emergency room.  # Leg swelling/in the context of worsening anemia-recommend Lasix  with blood transfusion.  #  Nausea/vomiting- ? Related to INQOVI -recommend antiemetics as needed- stable.   # back pain/spasm [Dr.Morales]- a/p epidural- currently on prn oxycodone - stable.   # GERD/reflux-Recommend NEXIUM over the counter prn- stable.   # Depression: Celexa   x1 - noted to have dizzy spells. Declined Remeron  15 mg nightly.   stable.   # CKD stage III [43]- US - APRIl 2023- NO hydronephrosis.  Continue on allopurinol  100 g twice a day.  GFR- 43 -  stable.   # DISPOSITION: # ADD LDH to labs today # please take her to ER today- needs 2 units of PRBC/lasix   # HOLD  Aranesp - today # follow up in 3 week-MD labs- cbc/cmp; LDH; iron studies; ferritin;;hold tube; aranesp  Possible D-2 1 unit PRBC-- Dr.B   Orders Placed This Encounter  Procedures   Lactate dehydrogenase    Standing Status:   Future    Expected Date:   03/16/2024    Expiration Date:   03/16/2025   CBC with Differential (Cancer Center Only)    Standing Status:   Future    Expected Date:   04/06/2024    Expiration Date:   03/16/2025   CMP (Cancer Center only)    Standing Status:   Future    Expected Date:   04/06/2024    Expiration Date:   03/16/2025   Lactate dehydrogenase    Standing Status:   Future    Expected Date:   04/06/2024    Expiration Date:   03/16/2025   Iron and TIBC    Standing Status:   Future    Expected Date:   04/06/2024    Expiration Date:   03/16/2025   Ferritin    Standing Status:   Future    Expected Date:   04/06/2024    Expiration Date:   03/16/2025   Sample to Blood Bank    Standing Status:   Future    Expected Date:   04/06/2024    Expiration Date:   03/16/2025   All questions were answered. The patient knows to call the clinic with any problems, questions or concerns.      Cindy JONELLE Joe, MD 03/16/2024 3:38 PM

## 2024-03-16 NOTE — Progress Notes (Signed)
 Ankles swelling x1 week. Trying to walk more.  Had a gentle fall 03/01/24, no injuries.  Son would like her to start taking K2 and calcium, is this ok?

## 2024-03-16 NOTE — ED Provider Notes (Signed)
 [facility] Provider Note    Event Date/Time   First MD Initiated Contact with Patient 03/16/24 1820     (approximate)   History   Abnormal Labs   HPI  Amanda Rose is a 89 y.o. female with a history of chronic myelomonocytic leukemia who presents with anemia.  She was sent in from the cancer center for transfusion as well as for diuresis with IV diuretics.  The patient reports generalized fatigue and weakness.  She states she feels somewhat lightheaded in the morning, but does not feel lightheaded currently.  She denies any chest pain or difficulty breathing.  She reports some swelling to her legs as well.  I reviewed the past medical records including a clinic note from Dr. Rennie from earlier today.  The patient was recommended to go to the ED for transfusion of 2 units of PRBCs as well as IV Lasix  for diuresis.   Physical Exam   Triage Vital Signs: ED Triage Vitals  Encounter Vitals Group     BP 03/16/24 1538 (!) 140/64     Girls Systolic BP Percentile --      Girls Diastolic BP Percentile --      Boys Systolic BP Percentile --      Boys Diastolic BP Percentile --      Pulse Rate 03/16/24 1538 81     Resp 03/16/24 1538 17     Temp 03/16/24 1538 97.7 F (36.5 C)     Temp Source 03/16/24 1538 Oral     SpO2 03/16/24 1538 97 %     Weight 03/16/24 1536 111 lb 8 oz (50.6 kg)     Height 03/16/24 1536 4' 11 (1.499 m)     Head Circumference --      Peak Flow --      Pain Score 03/16/24 1536 0     Pain Loc --      Pain Education --      Exclude from Growth Chart --     Most recent vital signs: Vitals:   03/16/24 2130 03/16/24 2319  BP: (!) 129/58 132/68  Pulse: 92 77  Resp: 19 14  Temp:  98 F (36.7 C)  SpO2: 100% 100%     General: Awake, no distress.  CV:  Good peripheral perfusion.  Resp:  Normal effort.  Abd:  No distention.  Other:  Conjunctival pallor.   ED Results / Procedures / Treatments   Labs (all labs ordered  are listed, but only abnormal results are displayed) Labs Reviewed  CBC - Abnormal; Notable for the following components:      Result Value   WBC 2.5 (*)    RBC 1.71 (*)    Hemoglobin 5.5 (*)    HCT 16.8 (*)    RDW 18.0 (*)    All other components within normal limits  COMPREHENSIVE METABOLIC PANEL WITH GFR - Abnormal; Notable for the following components:   BUN 28 (*)    Calcium 8.8 (*)    Alkaline Phosphatase 146 (*)    GFR, Estimated 57 (*)    All other components within normal limits  PROTIME-INR  CBC WITH DIFFERENTIAL/PLATELET  TYPE AND SCREEN  PREPARE RBC (CROSSMATCH)     EKG    RADIOLOGY    PROCEDURES:  Critical Care performed: No  Procedures   MEDICATIONS ORDERED IN ED: Medications  0.9 %  sodium chloride  infusion (10 mL/hr Intravenous New Bag/Given 03/16/24 2050)  furosemide  (LASIX ) injection 40 mg (  40 mg Intravenous Given 03/16/24 2100)     IMPRESSION / MDM / ASSESSMENT AND PLAN / ED COURSE  I reviewed the triage vital signs and the nursing notes.  89 year old female with PMH as noted above presents with generalized weakness, found to be anemic with a hemoglobin of 5, and sent in from the cancer center for transfusion of 2 units of PRBCs and for diuresis.  On exam she has mild lower extremity edema.  Differential diagnosis includes, but is not limited to, leukemia, other chronic hematologic process.  There is no evidence of acute blood loss.  CBC confirms hemoglobin of 5.5.  CMP shows no acute abnormalities.  We will transfuse 2 units of PRBCs, give IV Lasix , and reassess.  Patient's presentation is most consistent with acute presentation with potential threat to life or bodily function.  The patient is on the cardiac monitor to evaluate for evidence of arrhythmia and/or significant heart rate changes.  ----------------------------------------- 11:40 PM on 03/16/2024 -----------------------------------------  The patient has received a unit of blood  and is awaiting the second unit and a CBC afterwards.  I have signed her out to the oncoming ED physician Amanda Rose.  FINAL CLINICAL IMPRESSION(S) / ED DIAGNOSES   Final diagnoses:  Anemia, unspecified type     Rx / DC Orders   ED Discharge Orders     None        Note:  This document was prepared using Dragon voice recognition software and may include unintentional dictation errors.    Jacolyn Pae, MD 03/16/24 2340

## 2024-03-16 NOTE — Telephone Encounter (Signed)
 Called report to the ED. Spoke with [facility]. Pt has CML. Critical hemoglobin of 5.1 this afternoon. Oncologist wants pt to be eval and to receive blood transfusions.Pt transporting to the ED via wheelchair by staff. Accompanied by her daughter for support.

## 2024-03-16 NOTE — Patient Instructions (Signed)
#   HOLD INQOVI  until further directions.

## 2024-03-16 NOTE — ED Triage Notes (Signed)
 Pt arrives from the cancer center due to low hgb.

## 2024-03-16 NOTE — Progress Notes (Signed)
 Critical lab called by [facility] in the lab; hgb 5.1. Readback. MD notified.

## 2024-03-16 NOTE — Assessment & Plan Note (Addendum)
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  NEAGTIVE for JAK2 mutation; MPL; CALR mutation; PDGRA-NEGATIVE. [14th JAN 2025- INQOVI  [po decitabine ]- and aranesp    # s/p  #6 INQOVI  [po decitabine ]- finished 3 weeks ago.  Please hemoglobin is 5.1; white count 2.3 ANC 0.6; platelets normal.  I suspect/clinically suggestive of bone marrow suppression from Inqovi  rather than progressive leukemia. HOLD aranesp  200mcg [July 2025-increase to 300].  For now given low clinical concerns of progressive leukemia HOLD bone marrow given her age/ fraility/pt preference-recommend to insert PRBC blood transfusion in the emergency room.  # Leg swelling/in the context of worsening anemia-recommend Lasix  with blood transfusion.  # Nausea/vomiting- ? Related to INQOVI -recommend antiemetics as needed- stable.   # back pain/spasm [Dr.Morales]- a/p epidural- currently on prn oxycodone - stable.   # GERD/reflux-Recommend NEXIUM over the counter prn- stable.   # Depression: Celexa  x1 - noted to have dizzy spells. Declined Remeron  15 mg nightly.   stable.   # CKD stage III [43]- US - APRIl 2023- NO hydronephrosis.  Continue on allopurinol  100 g twice a day.  GFR- 43 -  stable.   # DISPOSITION: # ADD LDH to labs today # please take her to ER today- needs 2 units of PRBC/lasix   # HOLD  Aranesp - today # follow up in 3 week-MD labs- cbc/cmp; LDH; iron studies; ferritin;;hold tube; aranesp  Possible D-2 1 unit PRBC-- Dr.B

## 2024-03-17 ENCOUNTER — Other Ambulatory Visit: Payer: Self-pay

## 2024-03-17 ENCOUNTER — Inpatient Hospital Stay

## 2024-03-17 LAB — CBC WITH DIFFERENTIAL/PLATELET
Abs Immature Granulocytes: 0.01 K/uL (ref 0.00–0.07)
Basophils Absolute: 0 K/uL (ref 0.0–0.1)
Basophils Relative: 2 %
Eosinophils Absolute: 0 K/uL (ref 0.0–0.5)
Eosinophils Relative: 0 %
HCT: 23.7 % — ABNORMAL LOW (ref 36.0–46.0)
Hemoglobin: 8.3 g/dL — ABNORMAL LOW (ref 12.0–15.0)
Immature Granulocytes: 0 %
Lymphocytes Relative: 51 %
Lymphs Abs: 1.2 K/uL (ref 0.7–4.0)
MCH: 32.2 pg (ref 26.0–34.0)
MCHC: 35 g/dL (ref 30.0–36.0)
MCV: 91.9 fL (ref 80.0–100.0)
Monocytes Absolute: 0.4 K/uL (ref 0.1–1.0)
Monocytes Relative: 15 %
Neutro Abs: 0.8 K/uL — ABNORMAL LOW (ref 1.7–7.7)
Neutrophils Relative %: 32 %
Platelets: 232 K/uL (ref 150–400)
RBC: 2.58 MIL/uL — ABNORMAL LOW (ref 3.87–5.11)
RDW: 16.1 % — ABNORMAL HIGH (ref 11.5–15.5)
Smear Review: NORMAL
WBC: 2.4 K/uL — ABNORMAL LOW (ref 4.0–10.5)
nRBC: 0 % (ref 0.0–0.2)

## 2024-03-17 LAB — TYPE AND SCREEN
ABO/RH(D): O POS
Antibody Screen: NEGATIVE
Unit division: 0
Unit division: 0

## 2024-03-17 LAB — BPAM RBC
Blood Product Expiration Date: 202507142359
Blood Product Expiration Date: 202507312359
ISSUE DATE / TIME: 202507092010
ISSUE DATE / TIME: 202507092353
Unit Type and Rh: 9500
Unit Type and Rh: 9500

## 2024-03-17 NOTE — ED Notes (Signed)
 ERROR:  Departure Condition posted on wrong patient.

## 2024-03-17 NOTE — Discharge Instructions (Addendum)
 Hemoglobin has improved to 8.3.  Please follow-up with your outpatient doctors.

## 2024-03-17 NOTE — ED Provider Notes (Signed)
 Assumed care at shift change.  Patient here for symptomatic anemia.  History of the same secondary to leukemia.  Getting 2 units of packed red blood cells and then will need repeat CBC per previous provider.   4:39 AM  Pt's repeat hemoglobin now 8.3.  This was drawn 1 hour after blood products had completed.  Hemodynamically stable.  Will discharge with follow-up with her hematologist/oncologist.   [name], Amanda SAILOR, DO 03/17/24 [phone]

## 2024-03-23 ENCOUNTER — Other Ambulatory Visit: Payer: Self-pay | Admitting: Internal Medicine

## 2024-03-23 DIAGNOSIS — C9310 Chronic myelomonocytic leukemia not having achieved remission: Secondary | ICD-10-CM

## 2024-03-27 ENCOUNTER — Encounter: Payer: Self-pay | Admitting: Internal Medicine

## 2024-03-27 NOTE — Progress Notes (Signed)
 Patient daughter called regarding patient feeling dizzy/leg swelling.  Recommend checking pulse ox meter/and blood pressure-daughter will call us  back once she has the readings.   GB

## 2024-03-29 ENCOUNTER — Other Ambulatory Visit (HOSPITAL_COMMUNITY): Payer: Self-pay

## 2024-04-07 ENCOUNTER — Inpatient Hospital Stay (HOSPITAL_BASED_OUTPATIENT_CLINIC_OR_DEPARTMENT_OTHER): Admitting: Internal Medicine

## 2024-04-07 ENCOUNTER — Other Ambulatory Visit: Payer: Self-pay

## 2024-04-07 ENCOUNTER — Other Ambulatory Visit (HOSPITAL_COMMUNITY): Payer: Self-pay

## 2024-04-07 ENCOUNTER — Encounter: Payer: Self-pay | Admitting: Internal Medicine

## 2024-04-07 ENCOUNTER — Inpatient Hospital Stay

## 2024-04-07 ENCOUNTER — Other Ambulatory Visit: Payer: Self-pay | Admitting: *Deleted

## 2024-04-07 VITALS — BP 133/62 | HR 87 | Temp 97.1°F | Resp 18 | Ht 59.0 in | Wt 109.9 lb

## 2024-04-07 DIAGNOSIS — D649 Anemia, unspecified: Secondary | ICD-10-CM

## 2024-04-07 DIAGNOSIS — C9310 Chronic myelomonocytic leukemia not having achieved remission: Secondary | ICD-10-CM

## 2024-04-07 LAB — BRAIN NATRIURETIC PEPTIDE: B Natriuretic Peptide: 253.9 pg/mL — ABNORMAL HIGH (ref 0.0–100.0)

## 2024-04-07 LAB — CBC WITH DIFFERENTIAL (CANCER CENTER ONLY)
Abs Immature Granulocytes: 0.01 K/uL (ref 0.00–0.07)
Basophils Absolute: 0 K/uL (ref 0.0–0.1)
Basophils Relative: 0 %
Eosinophils Absolute: 0 K/uL (ref 0.0–0.5)
Eosinophils Relative: 2 %
HCT: 17.9 % — ABNORMAL LOW (ref 36.0–46.0)
Hemoglobin: 5.9 g/dL — CL (ref 12.0–15.0)
Immature Granulocytes: 1 %
Lymphocytes Relative: 62 %
Lymphs Abs: 1 K/uL (ref 0.7–4.0)
MCH: 32.4 pg (ref 26.0–34.0)
MCHC: 33 g/dL (ref 30.0–36.0)
MCV: 98.4 fL (ref 80.0–100.0)
Monocytes Absolute: 0.1 K/uL (ref 0.1–1.0)
Monocytes Relative: 7 %
Neutro Abs: 0.5 K/uL — ABNORMAL LOW (ref 1.7–7.7)
Neutrophils Relative %: 28 %
Platelet Count: 138 K/uL — ABNORMAL LOW (ref 150–400)
RBC: 1.82 MIL/uL — ABNORMAL LOW (ref 3.87–5.11)
RDW: 16.1 % — ABNORMAL HIGH (ref 11.5–15.5)
WBC Count: 1.7 K/uL — ABNORMAL LOW (ref 4.0–10.5)
nRBC: 0 % (ref 0.0–0.2)

## 2024-04-07 LAB — PREPARE RBC (CROSSMATCH)

## 2024-04-07 LAB — CMP (CANCER CENTER ONLY)
ALT: 9 U/L (ref 0–44)
AST: 21 U/L (ref 15–41)
Albumin: 3.5 g/dL (ref 3.5–5.0)
Alkaline Phosphatase: 157 U/L — ABNORMAL HIGH (ref 38–126)
Anion gap: 8 (ref 5–15)
BUN: 26 mg/dL — ABNORMAL HIGH (ref 8–23)
CO2: 23 mmol/L (ref 22–32)
Calcium: 8.6 mg/dL — ABNORMAL LOW (ref 8.9–10.3)
Chloride: 102 mmol/L (ref 98–111)
Creatinine: 1.28 mg/dL — ABNORMAL HIGH (ref 0.44–1.00)
GFR, Estimated: 40 mL/min — ABNORMAL LOW (ref >60)
Glucose, Bld: 190 mg/dL — ABNORMAL HIGH (ref 70–99)
Potassium: 3.9 mmol/L (ref 3.5–5.1)
Sodium: 133 mmol/L — ABNORMAL LOW (ref 135–145)
Total Bilirubin: 0.7 mg/dL (ref 0.0–1.2)
Total Protein: 6.8 g/dL (ref 6.5–8.1)

## 2024-04-07 LAB — SAMPLE TO BLOOD BANK

## 2024-04-07 LAB — IRON AND TIBC
Iron: 162 ug/dL (ref 28–170)
Saturation Ratios: 56 % — ABNORMAL HIGH (ref 10.4–31.8)
TIBC: 291 ug/dL (ref 250–450)
UIBC: 129 ug/dL

## 2024-04-07 LAB — FERRITIN: Ferritin: 565 ng/mL — ABNORMAL HIGH (ref 11–307)

## 2024-04-07 LAB — LACTATE DEHYDROGENASE: LDH: 148 U/L (ref 98–192)

## 2024-04-07 MED ORDER — DARBEPOETIN ALFA 300 MCG/0.6ML IJ SOSY
300.0000 ug | PREFILLED_SYRINGE | Freq: Once | INTRAMUSCULAR | Status: AC
  Administered 2024-04-07: 300 ug via SUBCUTANEOUS
  Filled 2024-04-07: qty 0.6

## 2024-04-07 MED ORDER — FUROSEMIDE 20 MG PO TABS: 20.0000 mg | ORAL_TABLET | Freq: Every day | ORAL | 0 refills | Status: DC

## 2024-04-07 NOTE — Progress Notes (Unsigned)
 Patient's hair is starting to fall out, she's also been extra tired and really cold lately. Other than that, no new or acute concerns.

## 2024-04-07 NOTE — Assessment & Plan Note (Addendum)
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  NEAGTIVE for JAK2 mutation; MPL; CALR mutation; PDGRA-NEGATIVE. [14th JAN 2025- INQOVI  [po decitabine ]- and aranesp    # currently s/p #6 INQOVI  [po decitabine ]--but on HOLD for 2 month- ? Etiology of worsening counts- bone marrow suppression from Inqovi  Vs- progressive leukemia. Discussed re: bone marrow biopsy. Proceed Aranesp  200mcg [July 2025-increase to 300]; continue to hold INQOVI .  Patient agrees to proceed with the bone marrow biopsy.  #  Leg swelling/in the context of worsening anemia-recommend Lasix  with blood transfusion.  # Nausea/vomiting- ? Related to INQOVI -recommend antiemetics as needed- stable.   # back pain/spasm [Dr.Morales]- a/p epidural- currently on prn oxycodone - stable.   # GERD/reflux-Recommend NEXIUM over the counter prn- stable.   # Depression: Celexa  x1 - noted to have dizzy spells. Declined Remeron  15 mg nightly.   stable.   # CKD stage III [43]- US - APRIl 2023- NO hydronephrosis.  Continue on allopurinol  100 g twice a day.  GFR- 43 -  stable.   # DISPOSITION: # BNP todays labs # 1 units of PRBC/lasix  tomorrow # proceed with  Aranesp - today # bone marrow biopsy- CMML/pancytopenia in 1 week- # follow up in 3 week-MD labs- cbc/cmp; b12; LDH hold tube; aranesp  Possible D-2 1 unit PRBC-- Dr.B

## 2024-04-07 NOTE — Progress Notes (Unsigned)
 Critical lab called by [facility] in the lab; Hgb 5.9. Readback. MD notified.

## 2024-04-07 NOTE — Progress Notes (Unsigned)
 [facility] OFFICE PROGRESS NOTE  Patient Care Team: Alla Amis, MD as PCP - General (Family Medicine) Rennie Cindy SAUNDERS, MD as Consulting Physician (Oncology)   # HEMATOLOGY HISTORY:  # LEUCOCYTOSIS- [FEB 2023- PCP] WBC 35 [predominant monocytosis; mild lymphocytosis; moderate neutrophilia]; [name]; platelets- 180; [name], white count elevated.     RBC (Red Blood Cell Count) 4.04 - 5.48 10^6/uL 3.02 Low     Hemoglobin 12.0 - 15.0 gm/dL 89.4 Low     Hematocrit 35.0 - 47.0 % 31.9 Low     MCV (Mean Corpuscular Volume) 80.0 - 100.0 fl 105.6 High     MCH (Mean Corpuscular Hemoglobin) 27.0 - 31.2 pg 34.8 High     MCHC (Mean Corpuscular Hemoglobin Concentration) 32.0 - 36.0 gm/dL 67.0    Platelet Count 150 - 450 10^3/uL 180    RDW-CV (Red Cell Distribution Width) 11.6 - 14.8 % 14.5    MPV (Mean Platelet Volume) 9.4 - 12.4 fl 9.1 Low     Neutrophils 1.50 - 7.80 10^3/uL 16.28 High     Lymphocytes 1.00 - 3.60 10^3/uL 4.61 High     Monocytes 0.00 - 1.50 10^3/uL 16.92 High     Eosinophils 0.00 - 0.55 10^3/uL 0.48    Basophils 0.00 - 0.09 10^3/uL 0.07    Neutrophil % 32.0 - 70.0 % 41.2    Lymphocyte % 10.0 - 50.0 % 11.7    Monocyte % 4.0 - 13.0 % 42.8 High   See manual diff and path review.   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   Accompanied by daughter.   Niels DELENA Hertz 89 y.o.  female pleasant chronic myelomonocytic leukemia  currently on INQOVI ; and aranesp  is here for follow-up.  Patient in the interim had episode of dizziness and leg swelling while at the beach trip.  Symptoms currently resolved.  However patient feels tired. Also notes to have shortness of breath especially with exertion.    Good appetite, 1 boost per day. Patient denies any diarrhea. No fever or chills.  Review of Systems  Constitutional:  Positive for malaise/fatigue. Negative for [name], palpitations, orthopnea and leg swelling.  Gastrointestinal:  Negative for  blood in stool, constipation, diarrhea, heartburn, melena, nausea and vomiting.  Genitourinary:  Negative for dysuria, frequency and urgency.  Musculoskeletal:  Positive for joint pain.  Skin: Negative.  Negative for itching and rash.  Neurological:  Negative for dizziness, tingling, focal weakness, weakness and headaches.  Endo/Heme/Allergies:  Does not bruise/bleed easily.  Psychiatric/Behavioral:  Negative for depression. The patient is not nervous/anxious and does not have insomnia.       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)    Hypothyroidism    Lower  extremity weakness    Pre-diabetes    Thyroid disease    Weakness of both legs     PAST SURGICAL HISTORY :   Past Surgical History:  Procedure Laterality Date   IR KYPHO EA ADDL LEVEL THORACIC OR LUMBAR  12/17/2021   IR KYPHO THORACIC WITH BONE BIOPSY  12/17/2021   IR RADIOLOGIST EVAL & MGMT  12/09/2021   KYPHOPLASTY N/A 04/05/2021   Procedure: T10 and L2 KYPHOPLASTY;  Surgeon: Kathlynn Sharper, MD;  [name];  Service: Orthopedics;  [name];    FAMILY HISTORY :   Family History  Problem Relation Age of Onset   Cancer Father        unknown    SOCIAL HISTORY:   Social History   Tobacco Use   Smoking status: Never   Smokeless tobacco: Never  Vaping Use   Vaping status: Never Used  Substance Use Topics   Alcohol use: Never   Drug use: Never    ALLERGIES:  is allergic to other.  MEDICATIONS:  Current Outpatient Medications  Medication Sig Dispense Refill   furosemide  (LASIX ) 20 MG tablet Take 1 tablet (20 mg total) by mouth daily. 15 tablet 0   allopurinol  (ZYLOPRIM ) 100 MG tablet TAKE 1 TABLET BY MOUTH TWICE A DAY 180 tablet 1   Cholecalciferol (VITAMIN D-3) 125 MCG (5000 UT) TABS Take 5,[name],000 mcg by mouth in the morning.     decitabine -cedazuridine  (INQOVI ) 35-100 MG oral tablet Take 1 tablet by mouth daily. Take for 5 days, hold for 23d, [name], at least 2 hr before or after food 5 tablet 3   levothyroxine (SYNTHROID) 100 MCG tablet Take 100 mcg by mouth daily before breakfast.     Magnesium 100 MG CAPS Take 100 mg by mouth 2 times daily at 12 noon and 4 pm.     Multiple Vitamin (MULTIVITAMIN WITH MINERALS) TABS tablet Take 1 tablet by mouth in the morning. Equate Complete Multivitamin     ondansetron  (ZOFRAN -ODT) 4 MG disintegrating tablet Take 1 tablet (4 mg total) by mouth every 8 (eight) hours as needed. (Patient not taking: Reported  on 03/16/2024) 90 tablet 1   No current facility-administered medications for this visit.    PHYSICAL EXAMINATION:  BP 133/62 (BP Location: Left Arm, Patient Position: Sitting)   Pulse 87   Temp (!) 97.1 F (36.2 C) (Tympanic)   Resp 18   Ht 4' 11 (1.499 m)   Wt 109 lb 14.4 oz (49.9 kg)   SpO2 100%   BMI 22.20 kg/m   Filed Weights   04/07/24 0944  Weight: 109 lb 14.4 oz (49.9 kg)    Mild bilateral ankle edema.  Mild crackles noted bilateral lower lung fields.  Physical  Exam Vitals and nursing note reviewed.  HENT:     Head: Normocephalic and atraumatic.     Mouth/Throat:     Pharynx: Oropharynx is clear.  Eyes:     Extraocular Movements: Extraocular movements intact.     Pupils: Pupils are equal, round, and reactive to light.  Cardiovascular:     Rate and Rhythm: Normal rate and regular rhythm.  Pulmonary:     Comments: Decreased breath sounds bilaterally.  Abdominal:     Palpations: Abdomen is soft.  Musculoskeletal:        General: Normal range of motion.     Cervical back: Normal range of motion.  Skin:    General: Skin is warm.  Neurological:     General: No focal deficit present.     Mental Status: She is alert and oriented to person, place, and time.  Psychiatric:        Behavior: Behavior normal.        Judgment: Judgment normal.       LABORATORY DATA:  I have reviewed the data as listed    Component Value Date/Time   NA 133 (L) 04/07/2024 0920   K 3.9 04/07/2024 0920   CL 102 04/07/2024 0920   CO2 23 04/07/2024 0920   GLUCOSE 190 (H) 04/07/2024 0920   BUN 26 (H) 04/07/2024 0920   CREATININE 1.28 (H) 04/07/2024 0920   CALCIUM 8.6 (L) 04/07/2024 0920   PROT 6.8 04/07/2024 0920   ALBUMIN 3.5 04/07/2024 0920   AST 21 04/07/2024 0920   ALT 9 04/07/2024 0920   ALKPHOS 157 (H) 04/07/2024 0920   BILITOT 0.7 04/07/2024 0920   GFRNONAA 40 (L) 04/07/2024 0920   GFRAA >60 12/03/2017 1003    No results found for: SPEP, UPEP  Lab Results   Component Value Date   WBC 1.7 (L) 04/07/2024   NEUTROABS 0.5 (L) 04/07/2024   HGB 5.9 (LL) 04/07/2024   HCT 17.9 (L) 04/07/2024   MCV 98.4 04/07/2024   PLT 138 (L) 04/07/2024      Chemistry      Component Value Date/Time   NA 133 (L) 04/07/2024 0920   K 3.9 04/07/2024 0920   CL 102 04/07/2024 0920   CO2 23 04/07/2024 0920   BUN 26 (H) 04/07/2024 0920   CREATININE 1.28 (H) 04/07/2024 0920      Component Value Date/Time   CALCIUM 8.6 (L) 04/07/2024 0920   ALKPHOS 157 (H) 04/07/2024 0920   AST 21 04/07/2024 0920   ALT 9 04/07/2024 0920   BILITOT 0.7 04/07/2024 0920       RADIOGRAPHIC STUDIES: I have personally reviewed the radiological images as listed and agreed with the findings in the report. No results found.    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  NEAGTIVE for JAK2 mutation; MPL; CALR mutation; PDGRA-NEGATIVE. [14th JAN 2025- INQOVI  [po decitabine ]- and aranesp    # currently s/p #6 INQOVI  [po decitabine ]--but on HOLD for 2 month- ? Etiology of worsening counts- bone marrow suppression from Inqovi  Vs- progressive leukemia. Discussed re: bone marrow biopsy. Proceed Aranesp  200mcg [July 2025-increase to 300]; continue to hold INQOVI .  Patient agrees to proceed with the bone marrow biopsy.  #  Leg swelling/in the context of worsening anemia-recommend Lasix  with blood transfusion.  # Nausea/vomiting- ? Related  to INQOVI -recommend antiemetics as needed- stable.   # back pain/spasm [Dr.Morales]- a/p epidural- currently on prn oxycodone - stable.   # GERD/reflux-Recommend NEXIUM over the counter prn- stable.   # Depression: Celexa  x1 - noted to have dizzy spells. Declined  Remeron  15 mg nightly.   stable.   # CKD stage III [43]- US - APRIl 2023- NO hydronephrosis.  Continue on allopurinol  100 g twice a day.  GFR- 43 -  stable.   # DISPOSITION: # BNP todays labs # 1 units of PRBC/lasix  tomorrow # proceed with  Aranesp - today # bone marrow biopsy- CMML/pancytopenia in 1 week- # follow up in 3 week-MD labs- cbc/cmp; b12; LDH hold tube; aranesp  Possible D-2 1 unit PRBC-- Dr.B   Orders Placed This Encounter  Procedures   IR BONE MARROW BIOPSY & ASPIRATION    Standing Status:   Future    Expected Date:   04/14/2024    Expiration Date:   04/07/2025    Reason for Exam (SYMPTOM  OR DIAGNOSIS REQUIRED):   bone marrow biopsy- CMML/pancytopenia in 1 week-    Preferred Imaging Location?:   [facility]   Brain natriuretic peptide    Standing Status:   Future    Number of Occurrences:   1    Expected Date:   04/07/2024    Expiration Date:   04/07/2025   CBC with Differential (Cancer Center Only)    Standing Status:   Future    Expected Date:   04/28/2024    Expiration Date:   07/27/2024   CMP (Cancer Center only)    Standing Status:   Future    Expected Date:   04/28/2024    Expiration Date:   07/27/2024   Vitamin B12    Standing Status:   Future    Expected Date:   04/28/2024    Expiration Date:   07/27/2024   Lactate dehydrogenase    Standing Status:   Future    Expected Date:   04/28/2024    Expiration Date:   07/27/2024   Sample to Blood Bank    Standing Status:   Future    Expected Date:   04/28/2024    Expiration Date:   07/27/2024   All questions were answered. The patient knows to call the clinic with any problems, questions or concerns.      Cindy JONELLE Joe, MD 04/08/2024 4:07 PM

## 2024-04-08 ENCOUNTER — Inpatient Hospital Stay: Attending: Internal Medicine

## 2024-04-08 ENCOUNTER — Telehealth: Payer: Self-pay

## 2024-04-08 ENCOUNTER — Other Ambulatory Visit: Payer: Self-pay | Admitting: Internal Medicine

## 2024-04-08 ENCOUNTER — Encounter: Payer: Self-pay | Admitting: Internal Medicine

## 2024-04-08 ENCOUNTER — Other Ambulatory Visit (HOSPITAL_COMMUNITY): Payer: Self-pay

## 2024-04-08 DIAGNOSIS — N1831 Chronic kidney disease, stage 3a: Secondary | ICD-10-CM | POA: Diagnosis present

## 2024-04-08 DIAGNOSIS — D631 Anemia in chronic kidney disease: Secondary | ICD-10-CM | POA: Insufficient documentation

## 2024-04-08 DIAGNOSIS — C9310 Chronic myelomonocytic leukemia not having achieved remission: Secondary | ICD-10-CM | POA: Insufficient documentation

## 2024-04-08 DIAGNOSIS — Z79899 Other long term (current) drug therapy: Secondary | ICD-10-CM | POA: Diagnosis not present

## 2024-04-08 DIAGNOSIS — D649 Anemia, unspecified: Secondary | ICD-10-CM

## 2024-04-08 MED ORDER — FUROSEMIDE 10 MG/ML IJ SOLN
20.0000 mg | Freq: Once | INTRAMUSCULAR | Status: AC
  Administered 2024-04-08: 20 mg via INTRAVENOUS
  Filled 2024-04-08: qty 2

## 2024-04-08 MED ORDER — SODIUM CHLORIDE 0.9% IV SOLUTION
250.0000 mL | INTRAVENOUS | Status: DC
  Filled 2024-04-08: qty 250

## 2024-04-08 MED ORDER — DIPHENHYDRAMINE HCL 25 MG PO CAPS
25.0000 mg | ORAL_CAPSULE | Freq: Once | ORAL | Status: AC
  Administered 2024-04-08: 25 mg via ORAL
  Filled 2024-04-08: qty 1

## 2024-04-08 MED ORDER — SODIUM CHLORIDE 0.9% IV SOLUTION
250.0000 mL | INTRAVENOUS | Status: DC
  Administered 2024-04-08: 100 mL via INTRAVENOUS
  Filled 2024-04-08: qty 250

## 2024-04-08 MED ORDER — ACETAMINOPHEN 325 MG PO TABS
650.0000 mg | ORAL_TABLET | Freq: Once | ORAL | Status: AC
  Administered 2024-04-08: 650 mg via ORAL
  Filled 2024-04-08: qty 2

## 2024-04-08 MED ORDER — SODIUM CHLORIDE 0.9% FLUSH
10.0000 mL | INTRAVENOUS | Status: DC | PRN
  Filled 2024-04-08: qty 10

## 2024-04-08 NOTE — Progress Notes (Signed)
 Pt called re: pt fatigued/sleepy post transfusion- pt received benadryl 25 mg- if not improved then recommend go to ER. Discussed with heather smith. GB

## 2024-04-08 NOTE — Telephone Encounter (Signed)
 Patient daughter called concerned with Ms. Amanda Rose excessive fatigue since receiving blood transfusion at [facility] today.  Reports that when she got home she went to sleep woke up to eat then went back to bed.  Napping is unusual for the patient.    Spoke with Dr. B who confirmed that patient did receive Benadryl with transfusion today and the sleepiness could be related.  If patient doesn't seem to improve over the weekend she should have to proceed to ED.  Juila notified and agrees.

## 2024-04-08 NOTE — Patient Instructions (Signed)

## 2024-04-09 LAB — TYPE AND SCREEN
ABO/RH(D): O POS
Antibody Screen: NEGATIVE
Unit division: 0
Unit division: 0

## 2024-04-09 LAB — BPAM RBC
Blood Product Expiration Date: 202508182359
Blood Product Expiration Date: 202508282359
ISSUE DATE / TIME: 202508010855
Unit Type and Rh: 5100
Unit Type and Rh: 9500

## 2024-04-11 ENCOUNTER — Other Ambulatory Visit: Payer: Self-pay | Admitting: Radiology

## 2024-04-11 DIAGNOSIS — C9310 Chronic myelomonocytic leukemia not having achieved remission: Secondary | ICD-10-CM

## 2024-04-11 NOTE — H&P (Signed)
 Chief Complaint: Patient was seen in consultation today for chronic myelomonocytic leukemia   Procedure: Bone Marrow Biopsy with Aspiration  Referring Physician(s): Rennie Cindy SAUNDERS  Supervising Physician: Jenna Hacker  Patient Status: [facility] - Out-pt  History of Present Illness: Amanda Rose is a 89 y.o. female with a history of hypothyroidism, HTN, osteoporosis, anemia, and compression fractures s/p kyphoplasty with Dr. Dolphus in 2023. 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 She has been undergoing routine treatment and following with Dr. Rennie. Recent lab work revealed worsening pancytopenia concerning for bone marrow suppression from chemotherapy vs leukemia progression. Patient referred to IR for repeat bone marrow biopsy for further clarification.   Patient is currently resting in bed with her daughter at the bedside. Admits to some worsening fatigue and weakness over the past 6 weeks. Denies any dizziness, shortness of breath, chest pain, fevers/chills. NPO since midnight. All questions and concerns answered at the bedside.   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 Authorizing Provider  allopurinol  (ZYLOPRIM ) 100  MG tablet TAKE 1 TABLET BY MOUTH TWICE A DAY 10/09/23   Brahmanday, Govinda R, MD  Cholecalciferol (VITAMIN D-3) 125 MCG (5000 UT) TABS Take 5,000 Units by mouth in the morning.    [provider]  Cyanocobalamin (VITAMIN B-12) 5000 MCG SUBL Take 5,000 mcg by mouth in the morning.    [provider]  decitabine -cedazuridine  (INQOVI ) 35-100 MG oral tablet Take 1 tablet by mouth daily. Take for 5 days, hold for 23d, [name], at least 2 hr before or after food 11/17/23   Brahmanday, Govinda R, MD  furosemide  (LASIX ) 20 MG tablet Take 1 tablet (20 mg total) by mouth daily. 04/07/24   Brahmanday, Govinda R, MD  levothyroxine (SYNTHROID) 100 MCG tablet Take 100 mcg by mouth daily before breakfast. 03/16/21   [provider]  Magnesium 100 MG CAPS Take 100 mg by mouth 2 times daily at 12 noon and 4 pm.    [provider]  Multiple Vitamin (MULTIVITAMIN WITH MINERALS) TABS tablet Take 1 tablet by mouth in the morning. Equate Complete Multivitamin    [provider]  ondansetron  (ZOFRAN -ODT) 4 MG disintegrating tablet Take 1 tablet (4 mg total) by mouth every 8 (eight) hours as needed. Patient not taking: Reported on 03/16/2024 08/12/23   Rennie Cindy SAUNDERS, MD     Family History  Problem Relation Age of Onset   Cancer Father        unknown    Social History   Socioeconomic History   Marital status: Widowed    Spouse name: Not on file   Number of children:  Not on file   Years of education: Not on file   Highest education level: Not on file  Occupational History   Not on file  Tobacco Use   Smoking status: Never   Smokeless tobacco: Never  Vaping Use   Vaping status: Never Used  Substance and Sexual Activity   Alcohol use: Never   Drug use: Never   Sexual activity: Not on file  Other Topics Concern   Not on file  Social History Narrative   Lives with other family    Social Drivers of Health   Financial  Resource Strain: Low Risk  (07/17/2023)   Received from [facility]   Overall Financial Resource Strain (CARDIA)    Difficulty of Paying Living Expenses: Not hard at all  Food Insecurity: No Food Insecurity (07/17/2023)   Received from [facility]   Hunger Vital Sign    Within the past 12 months, you worried that your food would run out before you got the money to buy more.: Never true    Within the past 12 months, the food you bought just didn't last and you didn't have money to get more.: Never true  Transportation Needs: No Transportation Needs (07/17/2023)   Received from [facility] - Transportation    In the past 12 months, has lack of transportation kept you from medical appointments or from getting medications?: No    Lack of Transportation (Non-Medical): No  Physical Activity: Not on file  Stress: Not on file  Social Connections: Not on file    Review of Systems  Constitutional:  Positive for fatigue.  Neurological:  Positive for weakness.  Denies any N/V, chest pain, shortness of breath, fevers/chills. All other ROS negative.  Vital Signs: BP 125/64   Pulse 68   Temp 99.5 F (37.5 C) (Temporal)   Resp 20   Ht 4' 9 (1.448 m)   Wt 108 lb (49 kg)   SpO2 96%   BMI 23.37 kg/m    Physical Exam Constitutional:      Appearance: Normal appearance.  HENT:     Mouth/Throat:     Mouth: Mucous membranes are moist.     Pharynx: Oropharynx is clear.  Cardiovascular:     Rate and Rhythm: Normal rate and regular rhythm.     Heart sounds: Normal heart sounds.  Pulmonary:     Effort: Pulmonary effort is normal.     Breath sounds: Normal breath sounds.  Abdominal:     General: Abdomen is flat.     Palpations: Abdomen is soft.     Tenderness: There is no abdominal tenderness.  Skin:    General: Skin is warm and dry.  Neurological:     Mental Status: She is alert and oriented to person, place, and time.   Psychiatric:        Mood and Affect: Mood normal.     Imaging: No results found.  Labs:  CBC: Recent Labs    03/16/24 1539 03/17/24 0357 04/07/24 0920 04/12/24 0901  WBC 2.5* 2.4* 1.7* 1.7*  HGB 5.5* 8.3* 5.9* 7.8*  HCT 16.8* 23.7* 17.9* 23.5*  PLT 244 232 138* 128*    COAGS: Recent Labs    03/16/24 1539  INR 1.1    BMP: Recent Labs    02/16/24 1437 03/16/24 1400 03/16/24 1539 04/07/24 0920  NA 134* 135 138 133*  K 4.3 4.2 4.3 3.9  CL 105 105 105 102  CO2 23 24 24 23   GLUCOSE 111* 108* 93 190*  BUN 30* 29* 28* 26*  CALCIUM 8.3* 8.4* 8.8* 8.6*  CREATININE 1.10* 1.08* 0.95 1.28*  GFRNONAA 48* 49* 57* 40*    LIVER FUNCTION TESTS: Recent Labs    02/16/24 1437 03/16/24 1400 03/16/24 1539 04/07/24 0920  BILITOT 0.9 0.6 0.6 0.7  AST 18 17 15 21   ALT 10 11 10 9   ALKPHOS 128* 145* 146* 157*  PROT 7.0 6.6 6.8 6.8  ALBUMIN 3.5 3.4* 3.5 3.5    TUMOR MARKERS: No results for input(s): AFPTM, CEA, CA199, CHROMGRNA in the last 8760 hours.  Assessment and Plan:  Chronic Myelomonocytic leukemia: Amanda Rose is a 89 y.o. female with a history of CMML diagnosed in 2023 who has been undergoing treatment and following with Dr. Rennie and was recently found to have worsening pancytopenia. Concern for bone marrow suppression secondary to treatment vs worsening leukemia. Patient subsequently referred to IR for repeat bone marrow biopsy with Dr. KANDICE Banner. Procedure to be performed under moderate sedation.  Risks and benefits of bone marrow biopsy was discussed with the patient and/or patient's family including, but not limited to bleeding, infection, damage to adjacent structures or low yield requiring additional tests.  All of the questions were answered and there is agreement to proceed.  Consent signed and in chart.   Thank you for this interesting consult. I greatly enjoyed meeting Amanda Rose and look forward to participating in their  care. A copy of this report was sent to the requesting provider on this date.  Electronically Signed: Autymn Omlor M Jaycelynn Knickerbocker, PA-C 04/12/2024, 9:42 AM   I spent a total of 15 Minutes in face to face clinical consultation, greater than 50% of which was counseling/coordinating care for bone marrow biopsy with aspiration.

## 2024-04-11 NOTE — Progress Notes (Signed)
 Patient for IR Bone Marrow Biopsy on Tues 04/12/24, I called and LVM for the patient on the phone and gave pre-procedure instructions. VM made the patient aware to be here at 8:30a, NPO after MN prior to procedure as well as driver post procedure/recovery/discharge. Called 04/11/24

## 2024-04-12 ENCOUNTER — Other Ambulatory Visit: Payer: Self-pay

## 2024-04-12 ENCOUNTER — Ambulatory Visit
Admission: RE | Admit: 2024-04-12 | Discharge: 2024-04-12 | Disposition: A | Discharge status: Home / Self Care | Source: Ambulatory Visit | Attending: Internal Medicine | Admitting: Internal Medicine

## 2024-04-12 ENCOUNTER — Encounter: Payer: Self-pay | Admitting: Radiology

## 2024-04-12 DIAGNOSIS — C9310 Chronic myelomonocytic leukemia not having achieved remission: Secondary | ICD-10-CM | POA: Diagnosis present

## 2024-04-12 DIAGNOSIS — D61818 Other pancytopenia: Secondary | ICD-10-CM | POA: Insufficient documentation

## 2024-04-12 HISTORY — PX: IR BONE MARROW BIOPSY & ASPIRATION: IMG5727

## 2024-04-12 LAB — CBC WITH DIFFERENTIAL/PLATELET
Abs Immature Granulocytes: 0.01 K/uL (ref 0.00–0.07)
Basophils Absolute: 0 K/uL (ref 0.0–0.1)
Basophils Relative: 1 %
Eosinophils Absolute: 0 K/uL (ref 0.0–0.5)
Eosinophils Relative: 1 %
HCT: 23.5 % — ABNORMAL LOW (ref 36.0–46.0)
Hemoglobin: 7.8 g/dL — ABNORMAL LOW (ref 12.0–15.0)
Immature Granulocytes: 1 %
Lymphocytes Relative: 69 %
Lymphs Abs: 1.2 K/uL (ref 0.7–4.0)
MCH: 32 pg (ref 26.0–34.0)
MCHC: 33.2 g/dL (ref 30.0–36.0)
MCV: 96.3 fL (ref 80.0–100.0)
Monocytes Absolute: 0.2 K/uL (ref 0.1–1.0)
Monocytes Relative: 10 %
Neutro Abs: 0.3 K/uL — CL (ref 1.7–7.7)
Neutrophils Relative %: 18 %
Platelets: 128 K/uL — ABNORMAL LOW (ref 150–400)
RBC: 2.44 MIL/uL — ABNORMAL LOW (ref 3.87–5.11)
RDW: 15.9 % — ABNORMAL HIGH (ref 11.5–15.5)
Smear Review: NORMAL
WBC: 1.7 K/uL — ABNORMAL LOW (ref 4.0–10.5)
nRBC: 0 % (ref 0.0–0.2)

## 2024-04-12 MED ORDER — FENTANYL CITRATE (PF) 100 MCG/2ML IJ SOLN
INTRAMUSCULAR | Status: AC | PRN
  Administered 2024-04-12: 50 ug via INTRAVENOUS
  Administered 2024-04-12: 25 ug via INTRAVENOUS

## 2024-04-12 MED ORDER — SODIUM CHLORIDE 0.9 % IV SOLN: INTRAVENOUS | Status: DC

## 2024-04-12 MED ORDER — MIDAZOLAM HCL 2 MG/2ML IJ SOLN
INTRAMUSCULAR | Status: AC
  Filled 2024-04-12: qty 2

## 2024-04-12 MED ORDER — MIDAZOLAM HCL 2 MG/2ML IJ SOLN
INTRAMUSCULAR | Status: AC | PRN
  Administered 2024-04-12: .5 mg via INTRAVENOUS
  Administered 2024-04-12: 1 mg via INTRAVENOUS

## 2024-04-12 MED ORDER — FENTANYL CITRATE (PF) 100 MCG/2ML IJ SOLN
INTRAMUSCULAR | Status: AC
  Filled 2024-04-12: qty 2

## 2024-04-12 MED ORDER — HEPARIN SOD (PORK) LOCK FLUSH 100 UNIT/ML IV SOLN
INTRAVENOUS | Status: AC
  Filled 2024-04-12: qty 5

## 2024-04-12 MED ORDER — LIDOCAINE 1 % OPTIME INJ - NO CHARGE
10.0000 mL | Freq: Once | INTRAMUSCULAR | Status: AC
  Administered 2024-04-12: 10 mL via INTRADERMAL
  Filled 2024-04-12: qty 10

## 2024-04-12 NOTE — Procedures (Signed)
 Interventional Radiology Procedure Note  Procedure: Fluoro Guided Biopsy of left iliac bone  Complications: None  Estimated Blood Loss: < 10 mL  Findings: 13 G core biopsy of left iliac performed under fluoro guidance.  One aspirate and 1  core samples obtained and sent to Pathology.  Cordella DELENA Banner, MD

## 2024-04-14 ENCOUNTER — Other Ambulatory Visit: Payer: Self-pay

## 2024-04-18 ENCOUNTER — Other Ambulatory Visit: Payer: Self-pay | Admitting: Internal Medicine

## 2024-04-18 LAB — SURGICAL PATHOLOGY

## 2024-04-20 ENCOUNTER — Encounter (HOSPITAL_COMMUNITY): Payer: Self-pay | Admitting: Internal Medicine

## 2024-04-20 ENCOUNTER — Other Ambulatory Visit: Payer: Self-pay

## 2024-04-21 ENCOUNTER — Other Ambulatory Visit: Payer: Self-pay

## 2024-04-21 ENCOUNTER — Encounter: Payer: Self-pay | Admitting: Internal Medicine

## 2024-04-21 ENCOUNTER — Inpatient Hospital Stay (HOSPITAL_BASED_OUTPATIENT_CLINIC_OR_DEPARTMENT_OTHER): Admitting: Internal Medicine

## 2024-04-21 ENCOUNTER — Inpatient Hospital Stay

## 2024-04-21 ENCOUNTER — Other Ambulatory Visit: Payer: Self-pay | Admitting: *Deleted

## 2024-04-21 DIAGNOSIS — C9310 Chronic myelomonocytic leukemia not having achieved remission: Secondary | ICD-10-CM | POA: Diagnosis not present

## 2024-04-21 DIAGNOSIS — D649 Anemia, unspecified: Secondary | ICD-10-CM

## 2024-04-21 LAB — CMP (CANCER CENTER ONLY)
ALT: 9 U/L (ref 0–44)
AST: 18 U/L (ref 15–41)
Albumin: 3.6 g/dL (ref 3.5–5.0)
Alkaline Phosphatase: 153 U/L — ABNORMAL HIGH (ref 38–126)
Anion gap: 8 (ref 5–15)
BUN: 26 mg/dL — ABNORMAL HIGH (ref 8–23)
CO2: 25 mmol/L (ref 22–32)
Calcium: 9.3 mg/dL (ref 8.9–10.3)
Chloride: 105 mmol/L (ref 98–111)
Creatinine: 1.05 mg/dL — ABNORMAL HIGH (ref 0.44–1.00)
GFR, Estimated: 50 mL/min — ABNORMAL LOW (ref >60)
Glucose, Bld: 96 mg/dL (ref 70–99)
Potassium: 4 mmol/L (ref 3.5–5.1)
Sodium: 138 mmol/L (ref 135–145)
Total Bilirubin: 0.8 mg/dL (ref 0.0–1.2)
Total Protein: 7 g/dL (ref 6.5–8.1)

## 2024-04-21 LAB — CBC WITH DIFFERENTIAL (CANCER CENTER ONLY)
Abs Immature Granulocytes: 0 K/uL (ref 0.00–0.07)
Basophils Absolute: 0 K/uL (ref 0.0–0.1)
Basophils Relative: 0 %
Eosinophils Absolute: 0 K/uL (ref 0.0–0.5)
Eosinophils Relative: 1 %
HCT: 20.5 % — ABNORMAL LOW (ref 36.0–46.0)
Hemoglobin: 6.6 g/dL — CL (ref 12.0–15.0)
Immature Granulocytes: 0 %
Lymphocytes Relative: 51 %
Lymphs Abs: 1 K/uL (ref 0.7–4.0)
MCH: 32.2 pg (ref 26.0–34.0)
MCHC: 32.2 g/dL (ref 30.0–36.0)
MCV: 100 fL (ref 80.0–100.0)
Monocytes Absolute: 0.1 K/uL (ref 0.1–1.0)
Monocytes Relative: 7 %
Neutro Abs: 0.8 K/uL — ABNORMAL LOW (ref 1.7–7.7)
Neutrophils Relative %: 41 %
Platelet Count: 113 K/uL — ABNORMAL LOW (ref 150–400)
RBC: 2.05 MIL/uL — ABNORMAL LOW (ref 3.87–5.11)
RDW: 17.6 % — ABNORMAL HIGH (ref 11.5–15.5)
WBC Count: 2 K/uL — ABNORMAL LOW (ref 4.0–10.5)
nRBC: 0 % (ref 0.0–0.2)

## 2024-04-21 LAB — VITAMIN B12: Vitamin B-12: 442 pg/mL (ref 180–914)

## 2024-04-21 LAB — LACTATE DEHYDROGENASE: LDH: 157 U/L (ref 98–192)

## 2024-04-21 LAB — PREPARE RBC (CROSSMATCH)

## 2024-04-21 MED ORDER — DARBEPOETIN ALFA 300 MCG/0.6ML IJ SOSY
300.0000 ug | PREFILLED_SYRINGE | Freq: Once | INTRAMUSCULAR | Status: AC
  Administered 2024-04-21: 300 ug via SUBCUTANEOUS
  Filled 2024-04-21: qty 0.6

## 2024-04-21 NOTE — Assessment & Plan Note (Addendum)
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  NEAGTIVE for JAK2 mutation; MPL; CALR mutation; PDGRA-NEGATIVE. [14th JAN 2025- INQOVI  [po decitabine ]- and aranesp .   # patient currently s/p #6 INQOVI  [po decitabine ]--but on HOLD for 2 month- ? Etiology of worsening counts-AUg 2025- bone marrow biopsy- Normocellular bone marrow (10% with erythroid predominant trilineage  hematopoiesis. NGS pending-  Proceed Aranesp  300mcg [July 2025-increase to 300]; continue to HOLD INQOVI . Awaiting to speak to pathologist.   # transient numbness of left foot- currently resolved.  Denies any other patient numbness or weakness-no clinical concern for stroke or TIA.  Monitor for now.  #  Leg swelling/in the context of worsening anemia-recommend Lasix  with blood transfusion.  # Nausea/vomiting- ? Related to INQOVI -recommend antiemetics as needed- stable.   # back pain/spasm [Dr.Morales]- a/p epidural- currently on prn oxycodone - stable.   # GERD/reflux-Recommend NEXIUM over the counter prn- stable.   # CKD stage III [43]- US - APRIl 2023- NO hydronephrosis.  Continue on allopurinol  100 g twice a day.  GFR- 43 -  stable.   # DISPOSITION: # 1 units of PRBC/lasix  tomorrow # proceed with  Aranesp - today # follow up in  2 week-MD labs- cbc/cmp; b12; LDH hold tube; aranesp  Possible D-2 1 unit PRBC-- Dr.B

## 2024-04-21 NOTE — Progress Notes (Signed)
 Critical lab called by Luke In lab; Hgb 6/6. Readback. MD notified.

## 2024-04-21 NOTE — Progress Notes (Signed)
 [facility] OFFICE PROGRESS NOTE  Patient Care Team: Alla Amis, MD as PCP - General (Family Medicine) Rennie Cindy SAUNDERS, MD as Consulting Physician (Oncology)   # HEMATOLOGY HISTORY:  # LEUCOCYTOSIS- [FEB 2023- PCP] WBC 35 [predominant monocytosis; mild lymphocytosis; moderate neutrophilia]; [name]; platelets- 180; [name], white count elevated.     RBC (Red Blood Cell Count) 4.04 - 5.48 10^6/uL 3.02 Low     Hemoglobin 12.0 - 15.0 gm/dL 89.4 Low     Hematocrit 35.0 - 47.0 % 31.9 Low     MCV (Mean Corpuscular Volume) 80.0 - 100.0 fl 105.6 High     MCH (Mean Corpuscular Hemoglobin) 27.0 - 31.2 pg 34.8 High     MCHC (Mean Corpuscular Hemoglobin Concentration) 32.0 - 36.0 gm/dL 67.0    Platelet Count 150 - 450 10^3/uL 180    RDW-CV (Red Cell Distribution Width) 11.6 - 14.8 % 14.5    MPV (Mean Platelet Volume) 9.4 - 12.4 fl 9.1 Low     Neutrophils 1.50 - 7.80 10^3/uL 16.28 High     Lymphocytes 1.00 - 3.60 10^3/uL 4.61 High     Monocytes 0.00 - 1.50 10^3/uL 16.92 High     Eosinophils 0.00 - 0.55 10^3/uL 0.48    Basophils 0.00 - 0.09 10^3/uL 0.07    Neutrophil % 32.0 - 70.0 % 41.2    Lymphocyte % 10.0 - 50.0 % 11.7    Monocyte % 4.0 - 13.0 % 42.8 High   See manual diff and path review.   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   Accompanied by daughter.   Amanda Rose 89 y.o.  female pleasant chronic myelomonocytic leukemia  currently on INQOVI ; and aranesp  is here for follow-up/ Bone marrow bx done 04/12/24.  Patient noted to have transient numbness of left foot- currently resolved.  Denies any other patient numbness or weakness.  However patient feels tired. Also notes to have shortness of breath especially with exertion.    Good appetite, 1 boost per day. Patient denies any diarrhea. No fever or chills.  Review of Systems  Constitutional:  Positive for malaise/fatigue. Negative for [name], palpitations, orthopnea and leg swelling.  Gastrointestinal:  Negative for blood in stool, constipation, diarrhea, heartburn, melena, nausea and vomiting.  Genitourinary:  Negative for dysuria, frequency and urgency.  Musculoskeletal:  Positive for joint pain.  Skin: Negative.  Negative for itching and rash.  Neurological:  Negative for dizziness, tingling, focal weakness, weakness and headaches.  Endo/Heme/Allergies:  Does not bruise/bleed easily.  Psychiatric/Behavioral:  Negative for depression. The patient is not nervous/anxious and does not have insomnia.       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)     Hypothyroidism    Lower extremity weakness    Pre-diabetes    Thyroid disease    Weakness of both legs     PAST SURGICAL HISTORY :   Past Surgical History:  Procedure Laterality Date   IR BONE MARROW BIOPSY & ASPIRATION  04/12/2024   IR KYPHO EA ADDL LEVEL THORACIC OR LUMBAR  12/17/2021   IR KYPHO THORACIC WITH BONE BIOPSY  12/17/2021   IR RADIOLOGIST EVAL & MGMT  12/09/2021   KYPHOPLASTY N/A 04/05/2021   Procedure: T10 and L2 KYPHOPLASTY;  Surgeon: Kathlynn Sharper, MD;  [name];  Service: Orthopedics;  [name];    FAMILY HISTORY :   Family History  Problem Relation Age of Onset   Cancer Father        unknown    SOCIAL HISTORY:   Social History   Tobacco Use   Smoking status: Never   Smokeless tobacco: Never  Vaping Use   Vaping status: Never Used  Substance Use Topics   Alcohol use: Never   Drug use: Never    ALLERGIES:  is allergic to other.  MEDICATIONS:  Current Outpatient Medications  Medication Sig Dispense Refill   allopurinol  (ZYLOPRIM ) 100 MG tablet TAKE 1 TABLET BY MOUTH TWICE A DAY 180 tablet 1   Cholecalciferol (VITAMIN D-3) 125 MCG (5000 UT) TABS Take 5,[name],000 mcg by mouth in the morning.     decitabine -cedazuridine  (INQOVI ) 35-100 MG oral tablet Take 1 tablet by mouth daily. Take for 5 days, hold for 23d, [name], at least 2 hr before or after food 5 tablet 3   furosemide  (LASIX ) 20 MG tablet TAKE 1 TABLET BY MOUTH EVERY DAY (Patient not taking: Reported on 04/21/2024) 90 tablet 1   levothyroxine (SYNTHROID) 100 MCG tablet Take 100 mcg by mouth daily before breakfast.     Magnesium 100 MG CAPS Take 100 mg by mouth 2 times daily at 12 noon and 4 pm.     Multiple Vitamin (MULTIVITAMIN WITH MINERALS) TABS tablet Take 1 tablet by mouth in the morning. Equate Complete Multivitamin     ondansetron  (ZOFRAN -ODT) 4 MG  disintegrating tablet Take 1 tablet (4 mg total) by mouth every 8 (eight) hours as needed. (Patient not taking: Reported on 04/21/2024) 90 tablet 1   No current facility-administered medications for this visit.    PHYSICAL EXAMINATION:  BP 119/68 (BP Location: Left Arm, Patient Position: Sitting, Cuff Size: Normal)   Pulse 78   Temp (!) 96.5 F (35.8 C) (Tympanic)   Resp 18   Ht 4' 9 (1.448 m)   Wt 108 lb 14.4 oz (49.4 kg)   SpO2 100%   BMI 23.57 kg/m   Filed Weights   04/21/24 0824  Weight: 108 lb  14.4 oz (49.4 kg)    Mild bilateral ankle edema.  Mild crackles noted bilateral lower lung fields.  Physical Exam Vitals and nursing note reviewed.  HENT:     Head: Normocephalic and atraumatic.     Mouth/Throat:     Pharynx: Oropharynx is clear.  Eyes:     Extraocular Movements: Extraocular movements intact.     Pupils: Pupils are equal, round, and reactive to light.  Cardiovascular:     Rate and Rhythm: Normal rate and regular rhythm.  Pulmonary:     Comments: Decreased breath sounds bilaterally.  Abdominal:     Palpations: Abdomen is soft.  Musculoskeletal:        General: Normal range of motion.     Cervical back: Normal range of motion.  Skin:    General: Skin is warm.  Neurological:     General: No focal deficit present.     Mental Status: She is alert and oriented to person, place, and time.  Psychiatric:        Behavior: Behavior normal.        Judgment: Judgment normal.       LABORATORY DATA:  I have reviewed the data as listed    Component Value Date/Time   NA 138 04/21/2024 0827   K 4.0 04/21/2024 0827   CL 105 04/21/2024 0827   CO2 25 04/21/2024 0827   GLUCOSE 96 04/21/2024 0827   BUN 26 (H) 04/21/2024 0827   CREATININE 1.05 (H) 04/21/2024 0827   CALCIUM 9.3 04/21/2024 0827   PROT 7.0 04/21/2024 0827   ALBUMIN 3.6 04/21/2024 0827   AST 18 04/21/2024 0827   ALT 9 04/21/2024 0827   ALKPHOS 153 (H) 04/21/2024 0827   BILITOT 0.8 04/21/2024  0827   GFRNONAA 50 (L) 04/21/2024 0827   GFRAA >60 12/03/2017 1003    No results found for: SPEP, UPEP  Lab Results  Component Value Date   WBC 2.0 (L) 04/21/2024   NEUTROABS 0.8 (L) 04/21/2024   HGB 6.6 (LL) 04/21/2024   HCT 20.5 (L) 04/21/2024   MCV 100.0 04/21/2024   PLT 113 (L) 04/21/2024      Chemistry      Component Value Date/Time   NA 138 04/21/2024 0827   K 4.0 04/21/2024 0827   CL 105 04/21/2024 0827   CO2 25 04/21/2024 0827   BUN 26 (H) 04/21/2024 0827   CREATININE 1.05 (H) 04/21/2024 0827      Component Value Date/Time   CALCIUM 9.3 04/21/2024 0827   ALKPHOS 153 (H) 04/21/2024 0827   AST 18 04/21/2024 0827   ALT 9 04/21/2024 0827   BILITOT 0.8 04/21/2024 0827       RADIOGRAPHIC STUDIES: I have personally reviewed the radiological images as listed and agreed with the findings in the report. No results found.    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  NEAGTIVE for JAK2 mutation; MPL; CALR mutation; PDGRA-NEGATIVE. [14th JAN 2025- INQOVI  [po decitabine ]- and aranesp .   # patient currently s/p #6 INQOVI  [po decitabine ]--but on HOLD for 2 month- ? Etiology of worsening counts-AUg 2025- bone marrow biopsy- Normocellular bone marrow (10% with erythroid predominant trilineage  hematopoiesis. NGS pending-  Proceed Aranesp  [July 2025-increase to 300]; continue to HOLD INQOVI . Awaiting to speak to pathologist.   #  transient numbness of left foot- currently resolved.  Denies any other patient numbness or weakness-no clinical concern for stroke or TIA.  Monitor for now.  #  Leg swelling/in the context of worsening anemia-recommend Lasix  with blood  transfusion.  # Nausea/vomiting- ? Related to INQOVI -recommend antiemetics as needed- stable.   # back pain/spasm [Dr.Morales]- a/p epidural- currently on prn oxycodone - stable.   # GERD/reflux-Recommend NEXIUM over the counter prn- stable.   # CKD stage III [43]- US - APRIl 2023- NO hydronephrosis.  Continue on allopurinol  100 g twice a day.  GFR- 43 -  stable.   # DISPOSITION: # 1 units of PRBC/lasix  tomorrow # proceed with  Aranesp - today # follow up in  2 week-MD labs- cbc/cmp; b12; LDH hold tube; aranesp  Possible D-2 1 unit PRBC-- Dr.B   Orders Placed This Encounter  Procedures   CBC with Differential (Cancer Center Only)    Standing Status:   Future    Expected Date:   05/05/2024    Expiration Date:   08/03/2024   CMP (Cancer Center only)    Standing Status:   Future    Expected Date:   05/05/2024    Expiration Date:   08/03/2024   Vitamin B12    Standing Status:   Future    Expected Date:   05/05/2024    Expiration Date:   08/03/2024   Lactate dehydrogenase    Standing Status:   Future    Expected Date:   05/05/2024    Expiration Date:   08/03/2024   Sample to Blood Bank    Standing Status:   Future    Expected Date:   05/05/2024    Expiration Date:   08/03/2024   All questions were answered. The patient knows to call the clinic with any problems, questions or concerns.      Cindy JONELLE Joe, MD 04/21/2024 9:42 AM

## 2024-04-21 NOTE — Progress Notes (Signed)
 Bone marrow bx done 04/12/24.  Pt wants to know if there is a way to list in her chart that she can't really raise her rt arm sue to shoulder issues. She had trouble raising her arm while having the bx done.

## 2024-04-22 ENCOUNTER — Inpatient Hospital Stay

## 2024-04-22 ENCOUNTER — Telehealth: Payer: Self-pay | Admitting: Internal Medicine

## 2024-04-22 DIAGNOSIS — C9310 Chronic myelomonocytic leukemia not having achieved remission: Secondary | ICD-10-CM

## 2024-04-22 DIAGNOSIS — D61811 Other drug-induced pancytopenia: Secondary | ICD-10-CM

## 2024-04-22 DIAGNOSIS — D649 Anemia, unspecified: Secondary | ICD-10-CM

## 2024-04-22 MED ORDER — DIPHENHYDRAMINE HCL 25 MG PO CAPS
25.0000 mg | ORAL_CAPSULE | Freq: Once | ORAL | Status: AC
  Administered 2024-04-22: 25 mg via ORAL
  Filled 2024-04-22: qty 1

## 2024-04-22 MED ORDER — ACETAMINOPHEN 325 MG PO TABS
650.0000 mg | ORAL_TABLET | Freq: Once | ORAL | Status: AC
  Administered 2024-04-22: 650 mg via ORAL
  Filled 2024-04-22: qty 2

## 2024-04-22 MED ORDER — FUROSEMIDE 10 MG/ML IJ SOLN
20.0000 mg | Freq: Once | INTRAMUSCULAR | Status: AC
  Administered 2024-04-22: 20 mg via INTRAVENOUS
  Filled 2024-04-22: qty 2

## 2024-04-22 MED ORDER — SODIUM CHLORIDE 0.9% IV SOLUTION
250.0000 mL | INTRAVENOUS | Status: DC
  Administered 2024-04-22: 100 mL via INTRAVENOUS
  Filled 2024-04-22: qty 250

## 2024-04-22 NOTE — Patient Instructions (Signed)

## 2024-04-22 NOTE — Telephone Encounter (Signed)
 Spoke to Dr.Sharma-no obvious evidence of any dysplasia or acute leukemia.  Awaiting NGS testing.  Awaiting further testing-parvovirus; EBV; CMV.  Ordered parvo antibodies &- PCR at next visit-   FYI

## 2024-04-23 LAB — TYPE AND SCREEN
ABO/RH(D): O POS
Antibody Screen: NEGATIVE
Unit division: 0

## 2024-04-23 LAB — BPAM RBC
Blood Product Expiration Date: 202509112359
ISSUE DATE / TIME: 202508151258
Unit Type and Rh: 5100

## 2024-04-25 LAB — SURGICAL PATHOLOGY

## 2024-04-28 ENCOUNTER — Ambulatory Visit

## 2024-04-28 ENCOUNTER — Ambulatory Visit: Admitting: Internal Medicine

## 2024-04-28 ENCOUNTER — Other Ambulatory Visit

## 2024-04-29 ENCOUNTER — Encounter

## 2024-04-29 ENCOUNTER — Encounter (HOSPITAL_COMMUNITY): Payer: Self-pay | Admitting: Internal Medicine

## 2024-05-05 ENCOUNTER — Other Ambulatory Visit: Payer: Self-pay

## 2024-05-05 ENCOUNTER — Inpatient Hospital Stay (HOSPITAL_BASED_OUTPATIENT_CLINIC_OR_DEPARTMENT_OTHER): Admitting: Internal Medicine

## 2024-05-05 ENCOUNTER — Encounter: Payer: Self-pay | Admitting: Internal Medicine

## 2024-05-05 ENCOUNTER — Inpatient Hospital Stay

## 2024-05-05 VITALS — BP 142/67 | HR 76 | Temp 97.8°F | Resp 18 | Ht <= 58 in | Wt 108.8 lb

## 2024-05-05 DIAGNOSIS — C9310 Chronic myelomonocytic leukemia not having achieved remission: Secondary | ICD-10-CM

## 2024-05-05 DIAGNOSIS — D649 Anemia, unspecified: Secondary | ICD-10-CM

## 2024-05-05 DIAGNOSIS — D61811 Other drug-induced pancytopenia: Secondary | ICD-10-CM

## 2024-05-05 LAB — CBC WITH DIFFERENTIAL (CANCER CENTER ONLY)
Abs Immature Granulocytes: 0 K/uL (ref 0.00–0.07)
Basophils Absolute: 0 K/uL (ref 0.0–0.1)
Basophils Relative: 1 %
Eosinophils Absolute: 0 K/uL (ref 0.0–0.5)
Eosinophils Relative: 0 %
HCT: 26 % — ABNORMAL LOW (ref 36.0–46.0)
Hemoglobin: 8.3 g/dL — ABNORMAL LOW (ref 12.0–15.0)
Immature Granulocytes: 0 %
Lymphocytes Relative: 37 %
Lymphs Abs: 1 K/uL (ref 0.7–4.0)
MCH: 32.5 pg (ref 26.0–34.0)
MCHC: 31.9 g/dL (ref 30.0–36.0)
MCV: 102 fL — ABNORMAL HIGH (ref 80.0–100.0)
Monocytes Absolute: 0.3 K/uL (ref 0.1–1.0)
Monocytes Relative: 10 %
Neutro Abs: 1.4 K/uL — ABNORMAL LOW (ref 1.7–7.7)
Neutrophils Relative %: 52 %
Platelet Count: 171 K/uL (ref 150–400)
RBC: 2.55 MIL/uL — ABNORMAL LOW (ref 3.87–5.11)
RDW: 23.2 % — ABNORMAL HIGH (ref 11.5–15.5)
WBC Count: 2.7 K/uL — ABNORMAL LOW (ref 4.0–10.5)
nRBC: 0 % (ref 0.0–0.2)

## 2024-05-05 LAB — CMP (CANCER CENTER ONLY)
ALT: 10 U/L (ref 0–44)
AST: 18 U/L (ref 15–41)
Albumin: 3.7 g/dL (ref 3.5–5.0)
Alkaline Phosphatase: 154 U/L — ABNORMAL HIGH (ref 38–126)
Anion gap: 7 (ref 5–15)
BUN: 30 mg/dL — ABNORMAL HIGH (ref 8–23)
CO2: 24 mmol/L (ref 22–32)
Calcium: 9.2 mg/dL (ref 8.9–10.3)
Chloride: 105 mmol/L (ref 98–111)
Creatinine: 1.26 mg/dL — ABNORMAL HIGH (ref 0.44–1.00)
GFR, Estimated: 41 mL/min — ABNORMAL LOW (ref >60)
Glucose, Bld: 93 mg/dL (ref 70–99)
Potassium: 4 mmol/L (ref 3.5–5.1)
Sodium: 136 mmol/L (ref 135–145)
Total Bilirubin: 0.8 mg/dL (ref 0.0–1.2)
Total Protein: 7.1 g/dL (ref 6.5–8.1)

## 2024-05-05 LAB — VITAMIN B12: Vitamin B-12: 664 pg/mL (ref 180–914)

## 2024-05-05 LAB — SAMPLE TO BLOOD BANK

## 2024-05-05 LAB — LACTATE DEHYDROGENASE: LDH: 169 U/L (ref 98–192)

## 2024-05-05 MED ORDER — DARBEPOETIN ALFA 300 MCG/0.6ML IJ SOSY
300.0000 ug | PREFILLED_SYRINGE | Freq: Once | INTRAMUSCULAR | Status: AC
  Administered 2024-05-05: 300 ug via SUBCUTANEOUS
  Filled 2024-05-05: qty 0.6

## 2024-05-05 NOTE — Assessment & Plan Note (Addendum)
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 POSITIVE FOR ASXL.  NEGATIVE for JAK2 mutation; MPL; CALR mutation; PDGRA-NEGATIVE. [14th JAN 2025- INQOVI  [po decitabine ]- and aranesp .   GIVEN worsening counts-AUg 2025- bone marrow biopsy- Normocellular bone marrow (10% with erythroid predominant trilineage  hematopoiesis. NGS -ASXL-  SPOKE to pathologist NEG-EBV; CMV- awaiting on parvo.  Proceed Aranesp  300mcg [July 2025-increase to 300];   # INQOVI  on HOLD for last 3 months- patient currently s/p #6 INQOVI  [po decitabine ]-sec to above pancytopenia- possibl viral infection- however today- seems to be improving. continue to HOLD INQOVI .   # Leg swelling/in the context of worsening anemia-recommend Lasix  with blood transfusion.  # Nausea/vomiting- ? Related to INQOVI -recommend antiemetics as needed- stable.   # back pain/spasm [Dr.Morales]- a/p epidural- currently on prn oxycodone - stable.   # GERD/reflux-Recommend NEXIUM over the counter prn- stable.   # CKD stage III [43]- US - APRIl 2023- NO hydronephrosis.  Continue on allopurinol  100 g twice a day.  GFR- 43 -  stable.   # DISPOSITION: #  NO BLOOD- cancel tomorrow appt.  # proceed with  Aranesp - today # follow up in  4  week-MD labs- cbc/cmp;  ; LDH;  aranesp  Possible D-2 1 unit PRBC-- Dr.B

## 2024-05-05 NOTE — Progress Notes (Signed)
 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, white count elevated.     RBC (Red Blood Cell Count) 4.04 - 5.48 10^6/uL 3.02 Low     Hemoglobin 12.0 - 15.0 gm/dL 89.4 Low     Hematocrit 35.0 - 47.0 % 31.9 Low     MCV (Mean Corpuscular Volume) 80.0 - 100.0 fl 105.6 High     MCH (Mean Corpuscular Hemoglobin) 27.0 - 31.2 pg 34.8 High     MCHC (Mean Corpuscular Hemoglobin Concentration) 32.0 - 36.0 gm/dL 67.0    Platelet Count 150 - 450 10^3/uL 180    RDW-CV (Red Cell Distribution Width) 11.6 - 14.8 % 14.5    MPV (Mean Platelet Volume) 9.4 - 12.4 fl 9.1 Low     Neutrophils 1.50 - 7.80 10^3/uL 16.28 High     Lymphocytes 1.00 - 3.60 10^3/uL 4.61 High     Monocytes 0.00 - 1.50 10^3/uL 16.92 High     Eosinophils 0.00 - 0.55 10^3/uL 0.48    Basophils 0.00 - 0.09 10^3/uL 0.07    Neutrophil % 32.0 - 70.0 % 41.2    Lymphocyte % 10.0 - 50.0 % 11.7    Monocyte % 4.0 - 13.0 % 42.8 High   See manual diff and path review.   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   Accompanied by daughter.   KEANDRIA BERROCAL 89 y.o.  female pleasant chronic myelomonocytic leukemia  currently on INQOVI - however on HOLD for last 3 month sec to pancytopenia; and aranesp  is here for follow-up/ Bone marrow bx done 04/12/24.  Pt just returned from the beach. Doing better-    Good appetite, 1 boost per day. Patient denies any diarrhea. No fever or chills.  Review of Systems  Constitutional:  Positive for malaise/fatigue. Negative for [name], palpitations, orthopnea and leg swelling.  Gastrointestinal:  Negative for blood in stool, constipation, diarrhea, heartburn, melena, nausea and vomiting.  Genitourinary:  Negative for dysuria, frequency and urgency.  Musculoskeletal:  Positive for joint pain.  Skin: Negative.  Negative for itching and rash.  Neurological:  Negative for dizziness, tingling, focal weakness, weakness and headaches.  Endo/Heme/Allergies:  Does not bruise/bleed easily.  Psychiatric/Behavioral:  Negative for depression. The patient is not nervous/anxious and does not have insomnia.       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)    Hypothyroidism    Lower extremity weakness    Pre-diabetes    Thyroid disease    Weakness of both legs      PAST SURGICAL HISTORY :   Past Surgical History:  Procedure Laterality Date   IR BONE MARROW BIOPSY & ASPIRATION  04/12/2024   IR KYPHO EA ADDL LEVEL THORACIC OR LUMBAR  12/17/2021   IR KYPHO THORACIC WITH BONE BIOPSY  12/17/2021   IR RADIOLOGIST EVAL & MGMT  12/09/2021   KYPHOPLASTY N/A 04/05/2021   Procedure: T10 and L2 KYPHOPLASTY;  Surgeon: Kathlynn Sharper, MD;  [name];  Service: Orthopedics;  [name];    FAMILY HISTORY :   Family History  Problem Relation Age of Onset   Cancer Father        unknown    SOCIAL HISTORY:   Social History   Tobacco Use   Smoking status: Never   Smokeless tobacco: Never  Vaping Use   Vaping status: Never Used  Substance Use Topics   Alcohol use: Never   Drug use: Never    ALLERGIES:  is allergic to other.  MEDICATIONS:  Current Outpatient Medications  Medication Sig Dispense Refill   allopurinol  (ZYLOPRIM ) 100 MG tablet TAKE 1 TABLET BY MOUTH TWICE A DAY 180 tablet 1   Cholecalciferol (VITAMIN D-3) 125 MCG (5000 UT) TABS Take 5,[name],000 mcg by mouth in the morning.     decitabine -cedazuridine  (INQOVI ) 35-100 MG oral tablet Take 1 tablet by mouth daily. Take for 5 days, hold for 23d, [name], at least 2 hr before or after food 5 tablet 3   levothyroxine (SYNTHROID) 100 MCG tablet Take 100 mcg by mouth daily before breakfast.     Magnesium 100 MG CAPS Take 100 mg by mouth 2 times daily at 12 noon and 4 pm.     Multiple Vitamin (MULTIVITAMIN WITH MINERALS) TABS tablet Take 1 tablet by mouth in the morning. Equate Complete Multivitamin     furosemide  (LASIX ) 20 MG tablet TAKE 1 TABLET BY MOUTH EVERY DAY (Patient not taking: Reported on 05/05/2024) 90 tablet 1   ondansetron  (ZOFRAN -ODT) 4 MG disintegrating tablet Take 1 tablet (4 mg total) by mouth every 8 (eight) hours as needed. (Patient not taking: Reported  on 05/05/2024) 90 tablet 1   No current facility-administered medications for this visit.    PHYSICAL EXAMINATION:  BP (!) 142/67 (BP Location: Left Arm, Patient Position: Sitting, Cuff Size: Normal)   Pulse 76   Temp 97.8 F (36.6 C) (Tympanic)   Resp 18   Ht 4' 9 (1.448 m)   Wt 108 lb 12.8 oz (49.4 kg)   SpO2 98%   BMI 23.54 kg/m   Filed Weights   05/05/24 0931  Weight: 108 lb 12.8 oz (49.4 kg)    Mild bilateral ankle edema.  Mild crackles noted bilateral lower  lung fields.  Physical Exam Vitals and nursing note reviewed.  HENT:     Head: Normocephalic and atraumatic.     Mouth/Throat:     Pharynx: Oropharynx is clear.  Eyes:     Extraocular Movements: Extraocular movements intact.     Pupils: Pupils are equal, round, and reactive to light.  Cardiovascular:     Rate and Rhythm: Normal rate and regular rhythm.  Pulmonary:     Comments: Decreased breath sounds bilaterally.  Abdominal:     Palpations: Abdomen is soft.  Musculoskeletal:        General: Normal range of motion.     Cervical back: Normal range of motion.  Skin:    General: Skin is warm.  Neurological:     General: No focal deficit present.     Mental Status: She is alert and oriented to person, place, and time.  Psychiatric:        Behavior: Behavior normal.        Judgment: Judgment normal.       LABORATORY DATA:  I have reviewed the data as listed    Component Value Date/Time   NA 136 05/05/2024 0941   K 4.0 05/05/2024 0941   CL 105 05/05/2024 0941   CO2 24 05/05/2024 0941   GLUCOSE 93 05/05/2024 0941   BUN 30 (H) 05/05/2024 0941   CREATININE 1.26 (H) 05/05/2024 0941   CALCIUM 9.2 05/05/2024 0941   PROT 7.1 05/05/2024 0941   ALBUMIN 3.7 05/05/2024 0941   AST 18 05/05/2024 0941   ALT 10 05/05/2024 0941   ALKPHOS 154 (H) 05/05/2024 0941   BILITOT 0.8 05/05/2024 0941   GFRNONAA 41 (L) 05/05/2024 0941   GFRAA >60 12/03/2017 1003    No results found for: SPEP, UPEP  Lab  Results  Component Value Date   WBC 2.7 (L) 05/05/2024   NEUTROABS 1.4 (L) 05/05/2024   HGB 8.3 (L) 05/05/2024   HCT 26.0 (L) 05/05/2024   MCV 102.0 (H) 05/05/2024   PLT 171 05/05/2024      Chemistry      Component Value Date/Time   NA 136 05/05/2024 0941   K 4.0 05/05/2024 0941   CL 105 05/05/2024 0941   CO2 24 05/05/2024 0941   BUN 30 (H) 05/05/2024 0941   CREATININE 1.26 (H) 05/05/2024 0941      Component Value Date/Time   CALCIUM 9.2 05/05/2024 0941   ALKPHOS 154 (H) 05/05/2024 0941   AST 18 05/05/2024 0941   ALT 10 05/05/2024 0941   BILITOT 0.8 05/05/2024 0941       RADIOGRAPHIC STUDIES: I have personally reviewed the radiological images as listed and agreed with the findings in the report. No results found.    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 POSITIVE FOR ASXL.  NEGATIVE for JAK2 mutation; MPL; CALR mutation; PDGRA-NEGATIVE. [14th JAN 2025- INQOVI  [po decitabine ]- and aranesp .   GIVEN worsening counts-AUg 2025- bone marrow biopsy- Normocellular bone marrow (10% with erythroid predominant trilineage  hematopoiesis. NGS -ASXL-  SPOKE to pathologist NEG-EBV; CMV- awaiting on parvo.  Proceed Aranesp  300mcg [July 2025-increase to 300];   # INQOVI  on HOLD for last 3 months- patient currently s/p #6 INQOVI  [po decitabine ]-sec to above pancytopenia- possibl viral infection- however today- seems to be improving. continue to HOLD  INQOVI .   # Leg swelling/in the context of worsening anemia-recommend Lasix  with blood transfusion.  # Nausea/vomiting- ? Related to INQOVI -recommend antiemetics as needed- stable.   # back pain/spasm [Dr.Morales]- a/p epidural- currently on prn  oxycodone - stable.   # GERD/reflux-Recommend NEXIUM over the counter prn- stable.   # CKD stage III [43]- US - APRIl 2023- NO hydronephrosis.  Continue on allopurinol  100 g twice a day.  GFR- 43 -  stable.   # DISPOSITION: #  NO BLOOD- cancel tomorrow appt.  # proceed with  Aranesp - today # follow up in  4  week-MD labs- cbc/cmp;  ; LDH;  aranesp  Possible D-2 1 unit PRBC-- Dr.B   Orders Placed This Encounter  Procedures   CMP (Cancer Center only)    Standing Status:   Future    Expected Date:   06/03/2024    Expiration Date:   05/05/2025   Lactate dehydrogenase    Standing Status:   Future    Expected Date:   06/03/2024    Expiration Date:   05/05/2025   CBC with Differential (Cancer Center Only)    Standing Status:   Future    Expected Date:   06/03/2024    Expiration Date:   05/05/2025   Sample to Blood Bank    Standing Status:   Future    Expected Date:   06/03/2024    Expiration Date:   05/05/2025   All questions were answered. The patient knows to call the clinic with any problems, questions or concerns.      Cindy JONELLE Joe, MD 05/05/2024 10:59 AM

## 2024-05-05 NOTE — Progress Notes (Signed)
 Waiting to hear about the added testing on her bx.

## 2024-05-06 ENCOUNTER — Inpatient Hospital Stay

## 2024-05-06 LAB — PARVOVIRUS B19 ANTIBODY, IGG AND IGM
Parovirus B19 IgG Abs: 3.7 {index} — ABNORMAL HIGH (ref 0.0–0.8)
Parovirus B19 IgM Abs: 0.1 {index} (ref 0.0–0.8)

## 2024-05-07 LAB — HUMAN PARVOVIRUS DNA DETECTION BY PCR: Parvovirus B19, PCR: NEGATIVE

## 2024-06-01 ENCOUNTER — Encounter: Payer: Self-pay | Admitting: Internal Medicine

## 2024-06-02 ENCOUNTER — Inpatient Hospital Stay

## 2024-06-02 ENCOUNTER — Encounter: Payer: Self-pay | Admitting: Internal Medicine

## 2024-06-02 ENCOUNTER — Inpatient Hospital Stay (HOSPITAL_BASED_OUTPATIENT_CLINIC_OR_DEPARTMENT_OTHER): Admitting: Internal Medicine

## 2024-06-02 ENCOUNTER — Inpatient Hospital Stay: Attending: Internal Medicine

## 2024-06-02 VITALS — BP 112/57 | HR 83 | Temp 98.5°F | Resp 19 | Ht <= 58 in | Wt 110.4 lb

## 2024-06-02 DIAGNOSIS — C9310 Chronic myelomonocytic leukemia not having achieved remission: Secondary | ICD-10-CM | POA: Insufficient documentation

## 2024-06-02 DIAGNOSIS — D61818 Other pancytopenia: Secondary | ICD-10-CM | POA: Diagnosis not present

## 2024-06-02 DIAGNOSIS — D649 Anemia, unspecified: Secondary | ICD-10-CM

## 2024-06-02 DIAGNOSIS — Z79899 Other long term (current) drug therapy: Secondary | ICD-10-CM | POA: Insufficient documentation

## 2024-06-02 DIAGNOSIS — D539 Nutritional anemia, unspecified: Secondary | ICD-10-CM | POA: Insufficient documentation

## 2024-06-02 DIAGNOSIS — N183 Chronic kidney disease, stage 3 unspecified: Secondary | ICD-10-CM | POA: Diagnosis not present

## 2024-06-02 LAB — CMP (CANCER CENTER ONLY)
ALT: 11 U/L (ref 0–44)
AST: 22 U/L (ref 15–41)
Albumin: 3.8 g/dL (ref 3.5–5.0)
Alkaline Phosphatase: 148 U/L — ABNORMAL HIGH (ref 38–126)
Anion gap: 7 (ref 5–15)
BUN: 25 mg/dL — ABNORMAL HIGH (ref 8–23)
CO2: 26 mmol/L (ref 22–32)
Calcium: 9 mg/dL (ref 8.9–10.3)
Chloride: 103 mmol/L (ref 98–111)
Creatinine: 1.23 mg/dL — ABNORMAL HIGH (ref 0.44–1.00)
GFR, Estimated: 42 mL/min — ABNORMAL LOW (ref >60)
Glucose, Bld: 95 mg/dL (ref 70–99)
Potassium: 4.1 mmol/L (ref 3.5–5.1)
Sodium: 136 mmol/L (ref 135–145)
Total Bilirubin: 0.9 mg/dL (ref 0.0–1.2)
Total Protein: 7 g/dL (ref 6.5–8.1)

## 2024-06-02 LAB — CBC WITH DIFFERENTIAL (CANCER CENTER ONLY)
Abs Immature Granulocytes: 0 K/uL (ref 0.00–0.07)
Basophils Absolute: 0 K/uL (ref 0.0–0.1)
Basophils Relative: 0 %
Eosinophils Absolute: 0 K/uL (ref 0.0–0.5)
Eosinophils Relative: 2 %
HCT: 28.7 % — ABNORMAL LOW (ref 36.0–46.0)
Hemoglobin: 9.4 g/dL — ABNORMAL LOW (ref 12.0–15.0)
Immature Granulocytes: 0 %
Lymphocytes Relative: 39 %
Lymphs Abs: 1 K/uL (ref 0.7–4.0)
MCH: 35.5 pg — ABNORMAL HIGH (ref 26.0–34.0)
MCHC: 32.8 g/dL (ref 30.0–36.0)
MCV: 108.3 fL — ABNORMAL HIGH (ref 80.0–100.0)
Monocytes Absolute: 0.4 K/uL (ref 0.1–1.0)
Monocytes Relative: 14 %
Neutro Abs: 1.2 K/uL — ABNORMAL LOW (ref 1.7–7.7)
Neutrophils Relative %: 45 %
Platelet Count: 172 K/uL (ref 150–400)
RBC: 2.65 MIL/uL — ABNORMAL LOW (ref 3.87–5.11)
WBC Count: 2.6 K/uL — ABNORMAL LOW (ref 4.0–10.5)
nRBC: 0 % (ref 0.0–0.2)

## 2024-06-02 LAB — LACTATE DEHYDROGENASE: LDH: 188 U/L (ref 98–192)

## 2024-06-02 LAB — SAMPLE TO BLOOD BANK

## 2024-06-02 MED ORDER — DARBEPOETIN ALFA 300 MCG/0.6ML IJ SOSY
300.0000 ug | PREFILLED_SYRINGE | Freq: Once | INTRAMUSCULAR | Status: AC
  Administered 2024-06-02: 300 ug via SUBCUTANEOUS
  Filled 2024-06-02: qty 0.6

## 2024-06-02 NOTE — Assessment & Plan Note (Addendum)
#   Chronic myelomonocytic leukemia- April 2023-bone marrow biopsy: Hypercellular marrow with myeloid hyperplasia and monocytosis worrisome for a myeloid  proliferative/myelodysplastic syndrome, specifically chronic  myelomonocytic leukemia; myeloid panel. POSITIVE FOR ASXL.  NEGATIVE for JAK2 mutation; MPL; CALR mutation; PDGRA-NEGATIVE. [14th JAN 2025- INQOVI  [po decitabine ]- and aranesp .   GIVEN worsening counts-AUg 2025- bone marrow biopsy- Normocellular bone marrow (10% with erythroid predominant trilineage  hematopoiesis. NGS -ASXL-  SPOKE to pathologist NEG-EBV; CMV-  NEG parvo; ALSO  Parvo PCR-NEG.  Proceed Aranesp  300mcg [July 2025-increase to 300].   # INQOVI  on HOLD for last 3 months [last too in [place] 2025]- patient currently s/p # 6 INQOVI  [po decitabine ]-sec to above pancytopenia- possibl viral infection- however today- seems to be improving.  # Discussed with patient and family that since bone marrow biopsy in August was negative for any leukemia [ remission]-options include-continue monitoring off therapy vs re-starting INQOVI  again [as still positive for ASXL mutation; and in general MDS cannot be cured].  After discussion it was decided to hold off therapy and revisit again in 6 weeks.  Proceed with Aranesp  today.  # Leg swelling/in the context of worsening anemia-recommend Lasix  with blood transfusion.  # Nausea/vomiting- ? Related to INQOVI -recommend antiemetics as needed- stable.   # back pain/spasm [Dr.Morales]- a/p epidural- currently on prn oxycodone - stable.   # GERD/reflux-Recommend NEXIUM over the counter prn- stable.   # CKD stage III [43]- US - APRIl 2023- NO hydronephrosis.  Continue on allopurinol  100 g twice a day.  GFR- 43 -  stable.   # DISPOSITION: #  NO BLOOD- cancel tomorrow appt.  # proceed with  Aranesp - today # follow up in  6 weeks/early that week-MD labs- cbc/cmp;  LDH;  aranesp  Possible D-2 1 unit PRBC-- Dr.B

## 2024-06-02 NOTE — Progress Notes (Signed)
 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, white count elevated.     RBC (Red Blood Cell Count) 4.04 - 5.48 10^6/uL 3.02 Low     Hemoglobin 12.0 - 15.0 gm/dL 89.4 Low     Hematocrit 35.0 - 47.0 % 31.9 Low     MCV (Mean Corpuscular Volume) 80.0 - 100.0 fl 105.6 High     MCH (Mean Corpuscular Hemoglobin) 27.0 - 31.2 pg 34.8 High     MCHC (Mean Corpuscular Hemoglobin Concentration) 32.0 - 36.0 gm/dL 67.0    Platelet Count 150 - 450 10^3/uL 180    RDW-CV (Red Cell Distribution Width) 11.6 - 14.8 % 14.5    MPV (Mean Platelet Volume) 9.4 - 12.4 fl 9.1 Low     Neutrophils 1.50 - 7.80 10^3/uL 16.28 High     Lymphocytes 1.00 - 3.60 10^3/uL 4.61 High     Monocytes 0.00 - 1.50 10^3/uL 16.92 High     Eosinophils 0.00 - 0.55 10^3/uL 0.48    Basophils 0.00 - 0.09 10^3/uL 0.07    Neutrophil % 32.0 - 70.0 % 41.2    Lymphocyte % 10.0 - 50.0 % 11.7    Monocyte % 4.0 - 13.0 % 42.8 High   See manual diff and path review.   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   Accompanied by daughter.   Amanda Rose 89 y.o.  female pleasant chronic myelomonocytic leukemia  currently on INQOVI - however on HELD SINCE JULY 2025- sec to pancytopenia; and aranesp  is here for follow-up    Good appetite, 1 boost per day. Patient denies any diarrhea. No fever or chills..  No nausea no vomiting.  Appetite is good.  Review of Systems  Constitutional:  Positive for malaise/fatigue. Negative for [name], palpitations, orthopnea and leg swelling.  Gastrointestinal:  Negative for blood in stool, constipation, diarrhea, heartburn, melena, nausea and vomiting.  Genitourinary:  Negative for dysuria, frequency and  urgency.  Musculoskeletal:  Positive for joint pain.  Skin: Negative.  Negative for itching and rash.  Neurological:  Negative for dizziness, tingling, focal weakness, weakness and headaches.  Endo/Heme/Allergies:  Does not bruise/bleed easily.  Psychiatric/Behavioral:  Negative for depression. The patient is not nervous/anxious and does not have insomnia.       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)    Hypothyroidism    Lower extremity weakness    Pre-diabetes    Thyroid disease    Weakness of both legs     PAST SURGICAL HISTORY :   Past  Surgical History:  Procedure Laterality Date   IR BONE MARROW BIOPSY & ASPIRATION  04/12/2024   IR KYPHO EA ADDL LEVEL THORACIC OR LUMBAR  12/17/2021   IR KYPHO THORACIC WITH BONE BIOPSY  12/17/2021   IR RADIOLOGIST EVAL & MGMT  12/09/2021   KYPHOPLASTY N/A 04/05/2021   Procedure: T10 and L2 KYPHOPLASTY;  Surgeon: Kathlynn Sharper, MD;  [name];  Service: Orthopedics;  [name];    FAMILY HISTORY :   Family History  Problem Relation Age of Onset   Cancer Father        unknown    SOCIAL HISTORY:   Social History   Tobacco Use   Smoking status: Never   Smokeless tobacco: Never  Vaping Use   Vaping status: Never Used  Substance Use Topics   Alcohol use: Never   Drug use: Never    ALLERGIES:  is allergic to other.  MEDICATIONS:  Current Outpatient Medications  Medication Sig Dispense Refill   allopurinol  (ZYLOPRIM ) 100 MG tablet TAKE 1 TABLET BY MOUTH TWICE A DAY 180 tablet 1   Cholecalciferol (VITAMIN D-3) 125 MCG (5000 UT) TABS Take 5,000 Units by mouth in the morning.     Cyanocobalamin  (VITAMIN B-12) 5000 MCG SUBL Take 5,000 mcg by mouth in the morning.     levothyroxine (SYNTHROID) 100 MCG tablet Take 100 mcg by mouth daily before breakfast.     Magnesium 100 MG CAPS Take 100 mg by mouth 2 times daily at 12 noon and 4 pm.     Multiple Vitamin (MULTIVITAMIN WITH MINERALS) TABS tablet Take 1 tablet by mouth in the morning. Equate Complete Multivitamin     decitabine -cedazuridine  (INQOVI ) 35-100 MG oral tablet Take 1 tablet by mouth daily. Take for 5 days, hold for 23d, [name], at least 2 hr before or after food 5 tablet 3   furosemide  (LASIX ) 20 MG tablet TAKE 1 TABLET BY MOUTH EVERY DAY (Patient not taking: Reported on 05/05/2024) 90 tablet 1   ondansetron  (ZOFRAN -ODT) 4 MG disintegrating tablet Take 1 tablet (4 mg total) by mouth every 8 (eight) hours as needed. (Patient not taking: Reported on 05/05/2024) 90 tablet 1   No  current facility-administered medications for this visit.    PHYSICAL EXAMINATION:  BP (!) 112/57 (BP Location: Left Arm, Patient Position: Sitting)   Pulse 83   Temp 98.5 F (36.9 C) (Tympanic)   Resp 19   Ht 4' 9 (1.448 m)   Wt 110 lb 6.4 oz (50.1 kg)   SpO2 96%   BMI 23.89 kg/m   Filed Weights   06/02/24 0928  Weight: 110 lb 6.4 oz (50.1 kg)    Mild bilateral ankle edema.  Mild crackles noted bilateral lower lung fields.  Physical Exam Vitals and nursing  note reviewed.  HENT:     Head: Normocephalic and atraumatic.     Mouth/Throat:     Pharynx: Oropharynx is clear.  Eyes:     Extraocular Movements: Extraocular movements intact.     Pupils: Pupils are equal, round, and reactive to light.  Cardiovascular:     Rate and Rhythm: Normal rate and regular rhythm.  Pulmonary:     Comments: Decreased breath sounds bilaterally.  Abdominal:     Palpations: Abdomen is soft.  Musculoskeletal:        General: Normal range of motion.     Cervical back: Normal range of motion.  Skin:    General: Skin is warm.  Neurological:     General: No focal deficit present.     Mental Status: She is alert and oriented to person, place, and time.  Psychiatric:        Behavior: Behavior normal.        Judgment: Judgment normal.       LABORATORY DATA:  I have reviewed the data as listed    Component Value Date/Time   NA 136 06/02/2024 0902   K 4.1 06/02/2024 0902   CL 103 06/02/2024 0902   CO2 26 06/02/2024 0902   GLUCOSE 95 06/02/2024 0902   BUN 25 (H) 06/02/2024 0902   CREATININE 1.23 (H) 06/02/2024 0902   CALCIUM 9.0 06/02/2024 0902   PROT 7.0 06/02/2024 0902   ALBUMIN 3.8 06/02/2024 0902   AST 22 06/02/2024 0902   ALT 11 06/02/2024 0902   ALKPHOS 148 (H) 06/02/2024 0902   BILITOT 0.9 06/02/2024 0902   GFRNONAA 42 (L) 06/02/2024 0902   GFRAA >60 12/03/2017 1003    No results found for: SPEP, UPEP  Lab Results  Component Value Date   WBC 2.6 (L) 06/02/2024    NEUTROABS 1.2 (L) 06/02/2024   HGB 9.4 (L) 06/02/2024   HCT 28.7 (L) 06/02/2024   MCV 108.3 (H) 06/02/2024   PLT 172 06/02/2024      Chemistry      Component Value Date/Time   NA 136 06/02/2024 0902   K 4.1 06/02/2024 0902   CL 103 06/02/2024 0902   CO2 26 06/02/2024 0902   BUN 25 (H) 06/02/2024 0902   CREATININE 1.23 (H) 06/02/2024 0902      Component Value Date/Time   CALCIUM 9.0 06/02/2024 0902   ALKPHOS 148 (H) 06/02/2024 0902   AST 22 06/02/2024 0902   ALT 11 06/02/2024 0902   BILITOT 0.9 06/02/2024 0902       RADIOGRAPHIC STUDIES: I have personally reviewed the radiological images as listed and agreed with the findings in the report. No results found.    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 POSITIVE FOR ASXL.  NEGATIVE for JAK2 mutation; MPL; CALR mutation; PDGRA-NEGATIVE. [14th JAN 2025- INQOVI  [po decitabine ]- and aranesp .   GIVEN worsening counts-AUg 2025- bone marrow biopsy- Normocellular bone marrow (10% with erythroid predominant trilineage  hematopoiesis. NGS -ASXL-  SPOKE to pathologist NEG-EBV; CMV-  NEG parvo; ALSO  Parvo PCR-NEG.  Proceed Aranesp  300mcg [July 2025-increase to 300].   # INQOVI  on HOLD for last 3 months [last too in [place] 2025]- patient currently s/p # 6 INQOVI  [po decitabine ]-sec to above pancytopenia- possibl viral infection- however today- seems to be improving.  #  Discussed with patient and family that since bone marrow biopsy in August was negative for any leukemia [ remission]-options include-continue monitoring off therapy vs re-starting INQOVI  again [as still positive for ASXL mutation; and in general MDS cannot be cured].  After  discussion it was decided to hold off therapy and revisit again in 6 weeks.  Proceed with Aranesp  today.  # Leg swelling/in the context of worsening anemia-recommend Lasix  with blood transfusion.  # Nausea/vomiting- ? Related to INQOVI -recommend antiemetics as needed- stable.   # back pain/spasm [Dr.Morales]- a/p epidural- currently on prn oxycodone - stable.   # GERD/reflux-Recommend NEXIUM over the counter prn- stable.   # CKD stage III [43]- US - APRIl 2023- NO hydronephrosis.  Continue on allopurinol  100 g twice a day.  GFR- 43 -  stable.   # DISPOSITION: #  NO BLOOD- cancel tomorrow appt.  # proceed with  Aranesp - today # follow up in  6 weeks/early that week-MD labs- cbc/cmp;  LDH;  aranesp  Possible D-2 1 unit PRBC-- Dr.B   Orders Placed This Encounter  Procedures   CMP (Cancer Center only)    Standing Status:   Future    Expected Date:   07/14/2024    Expiration Date:   10/12/2024   CBC with Differential (Cancer Center Only)    Standing Status:   Future    Expected Date:   07/14/2024    Expiration Date:   10/12/2024   Lactate dehydrogenase    Standing Status:   Future    Expected Date:   07/14/2024    Expiration Date:   10/12/2024   All questions were answered. The patient knows to call the clinic with any problems, questions or concerns.      Cindy JONELLE Joe, MD 06/02/2024 10:27 AM

## 2024-06-02 NOTE — Progress Notes (Signed)
 Patient woke up 3 days in a row last week and stated her feet was asleep.

## 2024-06-03 ENCOUNTER — Inpatient Hospital Stay

## 2024-06-03 ENCOUNTER — Other Ambulatory Visit (HOSPITAL_COMMUNITY): Payer: Self-pay

## 2024-06-27 ENCOUNTER — Other Ambulatory Visit: Payer: Self-pay | Admitting: Internal Medicine

## 2024-07-11 ENCOUNTER — Other Ambulatory Visit: Payer: Self-pay

## 2024-07-13 ENCOUNTER — Other Ambulatory Visit: Payer: Self-pay

## 2024-07-13 ENCOUNTER — Inpatient Hospital Stay

## 2024-07-13 ENCOUNTER — Inpatient Hospital Stay: Attending: Internal Medicine

## 2024-07-13 ENCOUNTER — Encounter: Payer: Self-pay | Admitting: Internal Medicine

## 2024-07-13 ENCOUNTER — Inpatient Hospital Stay (HOSPITAL_BASED_OUTPATIENT_CLINIC_OR_DEPARTMENT_OTHER): Admitting: Internal Medicine

## 2024-07-13 DIAGNOSIS — D649 Anemia, unspecified: Secondary | ICD-10-CM

## 2024-07-13 DIAGNOSIS — C9310 Chronic myelomonocytic leukemia not having achieved remission: Secondary | ICD-10-CM

## 2024-07-13 LAB — CBC WITH DIFFERENTIAL (CANCER CENTER ONLY)
Abs Immature Granulocytes: 0.01 K/uL (ref 0.00–0.07)
Basophils Absolute: 0 K/uL (ref 0.0–0.1)
Basophils Relative: 0 %
Eosinophils Absolute: 0 K/uL (ref 0.0–0.5)
Eosinophils Relative: 1 %
HCT: 30.5 % — ABNORMAL LOW (ref 36.0–46.0)
Hemoglobin: 9.9 g/dL — ABNORMAL LOW (ref 12.0–15.0)
Immature Granulocytes: 0 %
Lymphocytes Relative: 40 %
Lymphs Abs: 1 K/uL (ref 0.7–4.0)
MCH: 36 pg — ABNORMAL HIGH (ref 26.0–34.0)
MCHC: 32.5 g/dL (ref 30.0–36.0)
MCV: 110.9 fL — ABNORMAL HIGH (ref 80.0–100.0)
Monocytes Absolute: 0.3 K/uL (ref 0.1–1.0)
Monocytes Relative: 12 %
Neutro Abs: 1.2 K/uL — ABNORMAL LOW (ref 1.7–7.7)
Neutrophils Relative %: 47 %
Platelet Count: 134 K/uL — ABNORMAL LOW (ref 150–400)
RBC: 2.75 MIL/uL — ABNORMAL LOW (ref 3.87–5.11)
RDW: 14.6 % (ref 11.5–15.5)
WBC Count: 2.6 K/uL — ABNORMAL LOW (ref 4.0–10.5)
nRBC: 0 % (ref 0.0–0.2)

## 2024-07-13 LAB — CMP (CANCER CENTER ONLY)
ALT: 10 U/L (ref 0–44)
AST: 20 U/L (ref 15–41)
Albumin: 3.7 g/dL (ref 3.5–5.0)
Alkaline Phosphatase: 137 U/L — ABNORMAL HIGH (ref 38–126)
Anion gap: 8 (ref 5–15)
BUN: 26 mg/dL — ABNORMAL HIGH (ref 8–23)
CO2: 25 mmol/L (ref 22–32)
Calcium: 9 mg/dL (ref 8.9–10.3)
Chloride: 103 mmol/L (ref 98–111)
Creatinine: 1.1 mg/dL — ABNORMAL HIGH (ref 0.44–1.00)
GFR, Estimated: 48 mL/min — ABNORMAL LOW (ref >60)
Glucose, Bld: 129 mg/dL — ABNORMAL HIGH (ref 70–99)
Potassium: 4 mmol/L (ref 3.5–5.1)
Sodium: 136 mmol/L (ref 135–145)
Total Bilirubin: 0.6 mg/dL (ref 0.0–1.2)
Total Protein: 7 g/dL (ref 6.5–8.1)

## 2024-07-13 LAB — LACTATE DEHYDROGENASE: LDH: 205 U/L — ABNORMAL HIGH (ref 98–192)

## 2024-07-13 MED ORDER — DARBEPOETIN ALFA 300 MCG/0.6ML IJ SOSY
300.0000 ug | PREFILLED_SYRINGE | Freq: Once | INTRAMUSCULAR | Status: AC
  Administered 2024-07-13: 300 ug via SUBCUTANEOUS
  Filled 2024-07-13: qty 0.6

## 2024-07-13 NOTE — Progress Notes (Signed)
 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, white count elevated.     RBC (Red Blood Cell Count) 4.04 - 5.48 10^6/uL 3.02 Low     Hemoglobin 12.0 - 15.0 gm/dL 89.4 Low     Hematocrit 35.0 - 47.0 % 31.9 Low     MCV (Mean Corpuscular Volume) 80.0 - 100.0 fl 105.6 High     MCH (Mean Corpuscular Hemoglobin) 27.0 - 31.2 pg 34.8 High     MCHC (Mean Corpuscular Hemoglobin Concentration) 32.0 - 36.0 gm/dL 67.0    Platelet Count 150 - 450 10^3/uL 180    RDW-CV (Red Cell Distribution Width) 11.6 - 14.8 % 14.5    MPV (Mean Platelet Volume) 9.4 - 12.4 fl 9.1 Low     Neutrophils 1.50 - 7.80 10^3/uL 16.28 High     Lymphocytes 1.00 - 3.60 10^3/uL 4.61 High     Monocytes 0.00 - 1.50 10^3/uL 16.92 High     Eosinophils 0.00 - 0.55 10^3/uL 0.48    Basophils 0.00 - 0.09 10^3/uL 0.07    Neutrophil % 32.0 - 70.0 % 41.2    Lymphocyte % 10.0 - 50.0 % 11.7    Monocyte % 4.0 - 13.0 % 42.8 High   See manual diff and path review.   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   Accompanied by daughter.   Discussed the use of AI scribe software for clinical note transcription with the patient, who gave verbal consent to proceed.  History of Present Illness   Amanda Rose is a 89 year old female with chronic myelomonocytic leukemia who presents for follow-up.  She has been experiencing significant pain in her left upper arm and shoulder for approximately two and a half weeks. The pain is severe enough to prevent her from lifting her arm to reach items on the first shelf in her kitchen. There is no recent trauma or injury to the arm, and the pain is not associated with weakness, tingling, or numbness. She has not tried any treatments for the pain yet.  She was previously on Inqovi , which is currently on hold. Recent blood work  shows improvement, with hemoglobin levels increasing from 6 in August to 9.9, and white blood cell count rising from 1.7 to 2.6.     Review of Systems  Constitutional:  Positive for malaise/fatigue. Negative for [name], palpitations, orthopnea and leg swelling.  Gastrointestinal:  Negative for blood in stool, constipation, diarrhea, heartburn, melena, nausea and vomiting.  Genitourinary:  Negative for dysuria, frequency and urgency.  Musculoskeletal:  Positive for joint pain.  Skin: Negative.  Negative for  itching and rash.  Neurological:  Negative for dizziness, tingling, focal weakness, weakness and headaches.  Endo/Heme/Allergies:  Does not bruise/bleed easily.  Psychiatric/Behavioral:  Negative for depression. The patient is not nervous/anxious and does not have insomnia.       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)    Hypothyroidism    Lower extremity weakness    Pre-diabetes    Thyroid disease    Weakness of both legs     PAST SURGICAL HISTORY :   Past Surgical History:  Procedure Laterality Date   IR BONE MARROW BIOPSY & ASPIRATION  04/12/2024   IR KYPHO EA ADDL LEVEL THORACIC OR LUMBAR  12/17/2021   IR KYPHO THORACIC WITH BONE BIOPSY  12/17/2021   IR RADIOLOGIST EVAL & MGMT  12/09/2021   KYPHOPLASTY N/A 04/05/2021   Procedure: T10 and L2 KYPHOPLASTY;  Surgeon: Kathlynn Sharper, MD;  [name];  Service: Orthopedics;  [name];    FAMILY HISTORY :   Family History  Problem Relation Age of Onset   Cancer Father        unknown    SOCIAL HISTORY:   Social History   Tobacco Use   Smoking status: Never   Smokeless tobacco: Never  Vaping Use   Vaping status: Never Used  Substance Use Topics   Alcohol use: Never   Drug use: Never    ALLERGIES:  is allergic to other.  MEDICATIONS:  Current Outpatient Medications  Medication Sig Dispense Refill   allopurinol  (ZYLOPRIM ) 100 MG tablet TAKE 1 TABLET BY MOUTH TWICE A DAY 180 tablet 1   Calcium Carbonate (CALCIUM 500 PO) Take 500 mg by mouth daily.     Cyanocobalamin  (VITAMIN B-12) 5000 MCG SUBL Take 5,000 mcg by mouth in the morning.     levothyroxine (SYNTHROID) 100 MCG tablet Take 100 mcg by mouth daily before breakfast.     Magnesium 100 MG CAPS Take 100 mg by mouth daily.     Multiple Vitamin (MULTIVITAMIN WITH MINERALS) TABS tablet Take 1 tablet by mouth in the morning. Equate Complete Multivitamin      Cholecalciferol (VITAMIN D-3) 125 MCG (5000 UT) TABS Take 5,000 Units by mouth in the morning.     decitabine -cedazuridine  (INQOVI ) 35-100 MG oral tablet Take 1 tablet by mouth daily. Take for 5 days, hold for 23d, [name], at least 2 hr before or after food (Patient not taking: Reported on 07/13/2024) 5 tablet 3   furosemide  (LASIX ) 20 MG tablet TAKE 1 TABLET BY MOUTH EVERY DAY (Patient not taking: Reported on 07/13/2024) 90 tablet 1   ondansetron  (ZOFRAN -ODT) 4 MG disintegrating tablet Take 1 tablet (4 mg total) by mouth every 8 (eight) hours  as needed. (Patient not taking: Reported on 07/13/2024) 90 tablet 1   No current facility-administered medications for this visit.    PHYSICAL EXAMINATION:  BP (!) 141/75 (BP Location: Right Wrist, Patient Position: Sitting, Cuff Size: Small)   Pulse 74   Temp 98.6 F (37 C) (Tympanic)   Resp 18   Ht 4' 9 (1.448 m)   Wt 109 lb (49.4 kg)   SpO2 98%   BMI 23.59 kg/m   Filed Weights   07/13/24 0933  Weight: 109 lb (49.4 kg)    Mild bilateral ankle edema.  Mild crackles noted bilateral lower lung fields.  Physical Exam Vitals and nursing note reviewed.  HENT:     Head: Normocephalic and atraumatic.     Mouth/Throat:     Pharynx: Oropharynx is clear.  Eyes:     Extraocular Movements: Extraocular movements intact.     Pupils: Pupils are equal, round, and reactive to light.  Cardiovascular:     Rate and Rhythm: Normal rate and regular rhythm.  Pulmonary:     Comments: Decreased breath sounds bilaterally.  Abdominal:     Palpations: Abdomen is soft.  Musculoskeletal:        General: Normal range of motion.     Cervical back: Normal range of motion.  Skin:    General: Skin is warm.  Neurological:     General: No focal deficit present.     Mental Status: She is alert and oriented to person, place, and time.  Psychiatric:        Behavior: Behavior normal.        Judgment: Judgment normal.        LABORATORY DATA:  I have reviewed the data as listed    Component Value Date/Time   NA 136 07/13/2024 0915   K 4.0 07/13/2024 0915   CL 103 07/13/2024 0915   CO2 25 07/13/2024 0915   GLUCOSE 129 (H) 07/13/2024 0915   BUN 26 (H) 07/13/2024 0915   CREATININE 1.10 (H) 07/13/2024 0915   CALCIUM 9.0 07/13/2024 0915   PROT 7.0 07/13/2024 0915   ALBUMIN 3.7 07/13/2024 0915   AST 20 07/13/2024 0915   ALT 10 07/13/2024 0915   ALKPHOS 137 (H) 07/13/2024 0915   BILITOT 0.6 07/13/2024 0915   GFRNONAA 48 (L) 07/13/2024 0915   GFRAA >60 12/03/2017 1003    No results found for: SPEP, UPEP  Lab Results  Component Value Date   WBC 2.6 (L) 07/13/2024   NEUTROABS 1.2 (L) 07/13/2024   HGB 9.9 (L) 07/13/2024   HCT 30.5 (L) 07/13/2024   MCV 110.9 (H) 07/13/2024   PLT 134 (L) 07/13/2024      Chemistry      Component Value Date/Time   NA 136 07/13/2024 0915   K 4.0 07/13/2024 0915   CL 103 07/13/2024 0915   CO2 25 07/13/2024 0915   BUN 26 (H) 07/13/2024 0915   CREATININE 1.10 (H) 07/13/2024 0915      Component Value Date/Time   CALCIUM 9.0 07/13/2024 0915   ALKPHOS 137 (H) 07/13/2024 0915   AST 20 07/13/2024 0915   ALT 10 07/13/2024 0915   BILITOT 0.6 07/13/2024 0915       RADIOGRAPHIC STUDIES: I have personally reviewed the radiological images as listed and agreed with the findings in the report. No results found.    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 POSITIVE FOR ASXL.  NEGATIVE for JAK2 mutation; MPL; CALR mutation; PDGRA-NEGATIVE. [14th JAN 2025- INQOVI  [po decitabine ]- and aranesp .   # GIVEN worsening counts-AUG 2025- bone marrow biopsy- Normocellular  bone marrow (10% with erythroid predominant trilineage  hematopoiesis. NGS -ASXL-  SPOKE to pathologist NEG-EBV; CMV-  NEG parvo; ALSO  Parvo PCR-NEG.  Proceed Aranesp  300mcg [July 2025-increase to 300].   # INQOVI  on HOLD for last 3 months [last too in [place] 2025]- patient currently s/p # 6 INQOVI  [po decitabine ]-sec to above pancytopenia- possibl viral infection- however today- seems to be improving.  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 CONTINUE Monitoring OFF INQOVI .  Proceed with Aranesp  today.  # LEFT ARM/shoulder pain-unrelated to her current diagnosis of leukemia.  Likely arthritic -recommend topical NSAIDs; if not improved recommend further follow-up with PCP/orthopedics.  # Leg swelling/in the context of worsening anemia-recommend Lasix  with blood transfusion.  # Nausea/vomiting- ? Related to INQOVI -recommend antiemetics as needed- stable.   # back pain/spasm [Dr.Morales]- a/p epidural- currently on prn oxycodone - stable.   # GERD/reflux-Recommend NEXIUM over the counter prn- stable.   # CKD stage III [43]- US - APRIl 2023- NO hydronephrosis.  Continue on allopurinol  100 g twice a day.  GFR- 43 -  stable.   # DISPOSITION: #  NO BLOOD- cancel appt.  # proceed with  Aranesp - today # follow up in  6 weeks - MD labs- cbc/cmp;  LDH;  aranesp  Possible D-2 1 unit PRBC-- Dr.B   Orders Placed This Encounter  Procedures   CBC with Differential (Cancer Center Only)    Standing Status:   Future    Expected Date:   08/24/2024    Expiration Date:   07/13/2025   CMP (Cancer Center only)    Standing Status:   Future    Expected Date:   08/24/2024    Expiration Date:   07/13/2025   Lactate dehydrogenase    Standing Status:   Future    Expected Date:   08/24/2024    Expiration Date:   07/13/2025   All  questions were answered. The patient knows to call the clinic with any problems, questions or concerns.      Cindy JONELLE Joe, MD 07/13/2024 1:10 PM

## 2024-07-13 NOTE — Progress Notes (Signed)
 C/o lt arm pain and unable to lift x2 weeks.

## 2024-07-13 NOTE — Progress Notes (Signed)
 Opened in error

## 2024-07-13 NOTE — Progress Notes (Signed)
 Ok to give aranesp  300mcg.  Continuation of therapy per Olam Pinal

## 2024-07-13 NOTE — Assessment & Plan Note (Addendum)
#   Chronic myelomonocytic leukemia- April 2023-bone marrow biopsy: Hypercellular marrow with myeloid hyperplasia and monocytosis worrisome for a myeloid  proliferative/myelodysplastic syndrome, specifically chronic  myelomonocytic leukemia; myeloid panel. POSITIVE FOR ASXL.  NEGATIVE for JAK2 mutation; MPL; CALR mutation; PDGRA-NEGATIVE. [14th JAN 2025- INQOVI  [po decitabine ]- and aranesp .   # GIVEN worsening counts-AUG 2025- bone marrow biopsy- Normocellular bone marrow (10% with erythroid predominant trilineage  hematopoiesis. NGS -ASXL-  SPOKE to pathologist NEG-EBV; CMV-  NEG parvo; ALSO  Parvo PCR-NEG.  Proceed Aranesp  300mcg [July 2025-increase to 300].   # INQOVI  on HOLD for last 3 months [last too in [place] 2025]- patient currently s/p # 6 INQOVI  [po decitabine ]-sec to above pancytopenia- possibl viral infection- however today- seems to be improving.  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 CONTINUE Monitoring OFF INQOVI .  Proceed with Aranesp  today.  # LEFT ARM/shoulder pain-unrelated to her current diagnosis of leukemia.  Likely arthritic -recommend topical NSAIDs; if not improved recommend further follow-up with PCP/orthopedics.  # Leg swelling/in the context of worsening anemia-recommend Lasix  with blood transfusion.  # Nausea/vomiting- ? Related to INQOVI -recommend antiemetics as needed- stable.   # back pain/spasm [Dr.Morales]- a/p epidural- currently on prn oxycodone - stable.   # GERD/reflux-Recommend NEXIUM over the counter prn- stable.   # CKD stage III [43]- US - APRIl 2023- NO hydronephrosis.  Continue on allopurinol  100 g twice a day.  GFR- 43 -  stable.   # DISPOSITION: #  NO BLOOD- cancel appt.  # proceed with  Aranesp - today # follow up in  6 weeks - MD labs- cbc/cmp;  LDH;  aranesp  Possible  D-2 1 unit PRBC-- Dr.B

## 2024-07-13 NOTE — Progress Notes (Signed)
 Per Alyson, medication is still on hold indefinitely. Will re-enroll if patient is started on medication again. Disenrolled.

## 2024-07-14 ENCOUNTER — Encounter: Payer: Self-pay | Admitting: Internal Medicine

## 2024-07-15 ENCOUNTER — Inpatient Hospital Stay

## 2024-07-15 NOTE — Progress Notes (Signed)
 Spoke to daughter Recardo regarding patient's regulatory affairs officer /extraction.  Would recommend holding off unless absolutely necessary as ANC is 1.2.  Will reevaluate at his next visit

## 2024-07-27 ENCOUNTER — Other Ambulatory Visit: Payer: Self-pay | Admitting: *Deleted

## 2024-07-27 ENCOUNTER — Telehealth: Payer: Self-pay

## 2024-07-27 MED ORDER — AMOXICILLIN 500 MG PO CAPS: 500.0000 mg | ORAL_CAPSULE | Freq: Three times a day (TID) | ORAL | 0 refills | Status: DC

## 2024-07-27 NOTE — Telephone Encounter (Signed)
 Patient calling stating she needs dental work.  Dr. B previously told her that she will need antibiotic  prior to any dental work.    Discussed with her that Dr. B spoke to her daughter regarding dental work on 07/14/24, was advised to hold off unless absolutely necessary and discuss at next visit.  Patient the tooth is not causing pain but is bothersome to her and concerned that next visit with Dr. B isn't until 08/24/24.

## 2024-07-29 ENCOUNTER — Encounter: Payer: Self-pay | Admitting: Internal Medicine

## 2024-08-01 ENCOUNTER — Encounter: Payer: Self-pay | Admitting: *Deleted

## 2024-08-01 ENCOUNTER — Inpatient Hospital Stay

## 2024-08-01 ENCOUNTER — Other Ambulatory Visit: Payer: Self-pay | Admitting: *Deleted

## 2024-08-01 DIAGNOSIS — C9310 Chronic myelomonocytic leukemia not having achieved remission: Secondary | ICD-10-CM

## 2024-08-01 DIAGNOSIS — D649 Anemia, unspecified: Secondary | ICD-10-CM

## 2024-08-01 LAB — SAMPLE TO BLOOD BANK

## 2024-08-01 LAB — HEMOGLOBIN AND HEMATOCRIT (CANCER CENTER ONLY)
HCT: 31.7 % — ABNORMAL LOW (ref 36.0–46.0)
Hemoglobin: 10.6 g/dL — ABNORMAL LOW (ref 12.0–15.0)

## 2024-08-02 ENCOUNTER — Other Ambulatory Visit: Payer: Self-pay | Admitting: *Deleted

## 2024-08-02 ENCOUNTER — Inpatient Hospital Stay

## 2024-08-24 ENCOUNTER — Inpatient Hospital Stay: Admitting: Internal Medicine

## 2024-08-24 ENCOUNTER — Inpatient Hospital Stay

## 2024-08-24 ENCOUNTER — Inpatient Hospital Stay: Attending: Internal Medicine

## 2024-08-24 ENCOUNTER — Encounter: Payer: Self-pay | Admitting: Internal Medicine

## 2024-08-24 VITALS — BP 125/59 | HR 74 | Temp 97.1°F | Resp 18 | Ht <= 58 in | Wt 108.5 lb

## 2024-08-24 DIAGNOSIS — Z79899 Other long term (current) drug therapy: Secondary | ICD-10-CM | POA: Diagnosis not present

## 2024-08-24 DIAGNOSIS — N183 Chronic kidney disease, stage 3 unspecified: Secondary | ICD-10-CM | POA: Diagnosis not present

## 2024-08-24 DIAGNOSIS — C9310 Chronic myelomonocytic leukemia not having achieved remission: Secondary | ICD-10-CM | POA: Insufficient documentation

## 2024-08-24 DIAGNOSIS — D649 Anemia, unspecified: Secondary | ICD-10-CM

## 2024-08-24 LAB — CBC WITH DIFFERENTIAL (CANCER CENTER ONLY)
Abs Immature Granulocytes: 0.02 K/uL (ref 0.00–0.07)
Basophils Absolute: 0 K/uL (ref 0.0–0.1)
Basophils Relative: 1 %
Eosinophils Absolute: 0.1 K/uL (ref 0.0–0.5)
Eosinophils Relative: 2 %
HCT: 29.3 % — ABNORMAL LOW (ref 36.0–46.0)
Hemoglobin: 9.9 g/dL — ABNORMAL LOW (ref 12.0–15.0)
Immature Granulocytes: 1 %
Lymphocytes Relative: 31 %
Lymphs Abs: 1.2 K/uL (ref 0.7–4.0)
MCH: 36.5 pg — ABNORMAL HIGH (ref 26.0–34.0)
MCHC: 33.8 g/dL (ref 30.0–36.0)
MCV: 108.1 fL — ABNORMAL HIGH (ref 80.0–100.0)
Monocytes Absolute: 0.6 K/uL (ref 0.1–1.0)
Monocytes Relative: 14 %
Neutro Abs: 2.1 K/uL (ref 1.7–7.7)
Neutrophils Relative %: 51 %
Platelet Count: 152 K/uL (ref 150–400)
RBC: 2.71 MIL/uL — ABNORMAL LOW (ref 3.87–5.11)
RDW: 13.1 % (ref 11.5–15.5)
WBC Count: 4 K/uL (ref 4.0–10.5)
nRBC: 0 % (ref 0.0–0.2)

## 2024-08-24 LAB — CMP (CANCER CENTER ONLY)
ALT: 9 U/L (ref 0–44)
AST: 20 U/L (ref 15–41)
Albumin: 4.2 g/dL (ref 3.5–5.0)
Alkaline Phosphatase: 131 U/L — ABNORMAL HIGH (ref 38–126)
Anion gap: 11 (ref 5–15)
BUN: 35 mg/dL — ABNORMAL HIGH (ref 8–23)
CO2: 26 mmol/L (ref 22–32)
Calcium: 10 mg/dL (ref 8.9–10.3)
Chloride: 103 mmol/L (ref 98–111)
Creatinine: 1.16 mg/dL — ABNORMAL HIGH (ref 0.44–1.00)
GFR, Estimated: 45 mL/min — ABNORMAL LOW (ref >60)
Glucose, Bld: 81 mg/dL (ref 70–99)
Potassium: 4 mmol/L (ref 3.5–5.1)
Sodium: 139 mmol/L (ref 135–145)
Total Bilirubin: 0.3 mg/dL (ref 0.0–1.2)
Total Protein: 7.2 g/dL (ref 6.5–8.1)

## 2024-08-24 LAB — LACTATE DEHYDROGENASE: LDH: 245 U/L — ABNORMAL HIGH (ref 105–235)

## 2024-08-24 MED ORDER — DARBEPOETIN ALFA 300 MCG/0.6ML IJ SOSY
300.0000 ug | PREFILLED_SYRINGE | Freq: Once | INTRAMUSCULAR | Status: AC
  Administered 2024-08-24: 14:00:00 300 ug via SUBCUTANEOUS
  Filled 2024-08-24: qty 0.6

## 2024-08-24 NOTE — Progress Notes (Signed)
 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, white count elevated.     RBC (Red Blood Cell Count) 4.04 - 5.48 106/uL 3.02 Low     Hemoglobin 12.0 - 15.0 gm/dL 89.4 Low     Hematocrit 35.0 - 47.0 % 31.9 Low     MCV (Mean Corpuscular Volume) 80.0 - 100.0 fl 105.6 High     MCH (Mean Corpuscular Hemoglobin) 27.0 - 31.2 pg 34.8 High     MCHC (Mean Corpuscular Hemoglobin Concentration) 32.0 - 36.0 gm/dL 67.0    Platelet Count 150 - 450 103/uL 180    RDW-CV (Red Cell Distribution Width) 11.6 - 14.8 % 14.5    MPV (Mean Platelet Volume) 9.4 - 12.4 fl 9.1 Low     Neutrophils 1.50 - 7.80 103/uL 16.28 High     Lymphocytes 1.00 - 3.60 103/uL 4.61 High     Monocytes 0.00 - 1.50 103/uL 16.92 High     Eosinophils 0.00 - 0.55 103/uL 0.48    Basophils 0.00 - 0.09 103/uL 0.07    Neutrophil % 32.0 - 70.0 % 41.2    Lymphocyte % 10.0 - 50.0 % 11.7    Monocyte % 4.0 - 13.0 % 42.8 High   See manual diff and path review.   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    August was negative for any leukemia [remission]-options include-to CONTINUE TO HOLD [as still positive for ASXL mutation; and in general MDS cannot be cured].   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   Accompanied by daughter.   Discussed the use of AI scribe software for clinical note transcription with the patient, who gave verbal consent to proceed.  History of Present Illness   Amanda Rose is a 89 year old female with chronic myelomonocytic leukemia in remission who presents for hematology/oncology follow-up.  Laboratory monitoring shows hemoglobin values between 9.9 and 10.6 g/dL, which are stable and improved compared to prior measurements. No modifications have been made to her medications or daily routine.  She is not using levalbuterol or albuterol. She denies fever, dyspnea, or other acute symptoms, and reports only a mild pharyngeal tickle.  She continues to experience persistent right  arm pain attributed to arthritis, managed with topical cream and acetaminophen . There have been no recent injections or interventions from pain specialists.     Review of Systems  Constitutional:  Positive for malaise/fatigue. Negative for [name], palpitations, orthopnea and leg swelling.  Gastrointestinal:  Negative for blood in stool, constipation, diarrhea, heartburn, melena, nausea and vomiting.   Genitourinary:  Negative for dysuria, frequency and urgency.  Musculoskeletal:  Positive for joint pain.  Skin: Negative.  Negative for itching and rash.  Neurological:  Negative for dizziness, tingling, focal weakness, weakness and headaches.  Endo/Heme/Allergies:  Does not bruise/bleed easily.  Psychiatric/Behavioral:  Negative for depression. The patient is not nervous/anxious and does not have insomnia.       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)    Hypothyroidism    Lower extremity weakness    Pre-diabetes    Thyroid disease    Weakness of both legs     PAST SURGICAL HISTORY :   Past Surgical History:  Procedure Laterality Date   IR BONE MARROW BIOPSY & ASPIRATION  04/12/2024   IR KYPHO EA ADDL LEVEL THORACIC OR LUMBAR  12/17/2021   IR KYPHO THORACIC WITH BONE BIOPSY  12/17/2021   IR RADIOLOGIST EVAL & MGMT  12/09/2021   KYPHOPLASTY N/A 04/05/2021   Procedure: T10 and L2 KYPHOPLASTY;  Surgeon: Kathlynn Sharper, MD;  [name];  Service: Orthopedics;  [name];    FAMILY HISTORY :   Family History  Problem Relation Age of Onset   Cancer Father        unknown    SOCIAL HISTORY:   Social History   Tobacco Use   Smoking status: Never   Smokeless tobacco: Never  Vaping Use   Vaping status: Never Used  Substance Use Topics   Alcohol use: Never   Drug use: Never    ALLERGIES:  is allergic to other.  MEDICATIONS:  Current Outpatient Medications  Medication Sig Dispense Refill   Calcium Carbonate (CALCIUM 500 PO) Take 500 mg by mouth daily.     Cholecalciferol (VITAMIN D-3) 125 MCG (5000 UT) TABS Take 5,000 Units by mouth in the morning.     Cyanocobalamin  (VITAMIN B-12) 5000 MCG SUBL Take 5,000 mcg by mouth in the morning.     levothyroxine (SYNTHROID) 100 MCG tablet Take 100 mcg by mouth daily before breakfast.     Magnesium 100 MG CAPS Take 100 mg by mouth  daily.     Multiple Vitamin (MULTIVITAMIN WITH MINERALS) TABS tablet Take 1 tablet by mouth in the morning. Equate Complete Multivitamin     decitabine -cedazuridine  (INQOVI ) 35-100 MG oral tablet Take 1 tablet by mouth daily. Take for 5 days, hold for 23d, [name], at least 2 hr before or after food (Patient not taking: Reported on 08/24/2024) 5 tablet 3   furosemide  (LASIX ) 20 MG tablet TAKE 1 TABLET BY MOUTH EVERY DAY (Patient not taking: Reported on 08/24/2024) 90 tablet 1   ondansetron  (ZOFRAN -ODT) 4 MG disintegrating tablet Take 1 tablet (4 mg total) by mouth every 8 (eight) hours as needed. (Patient not taking: Reported on 08/24/2024) 90 tablet 1   No current facility-administered medications for  this visit.   Facility-Administered Medications Ordered in Other Visits  Medication Dose Route Frequency Provider Last Rate Last Admin   Darbepoetin Alfa  (ARANESP ) injection 300 mcg  300 mcg Subcutaneous Once Khaniyah Bezek R, MD        PHYSICAL EXAMINATION:  BP (!) 125/59 (BP Location: Left Arm, Patient Position: Sitting, Cuff Size: Normal)   Pulse 74   Temp (!) 97.1 F (36.2 C) (Tympanic)   Resp 18   Ht 4' 9 (1.448 m)   Wt 108 lb 8 oz (49.2 kg)   SpO2 97%   BMI 23.48 kg/m   Filed Weights   08/24/24 1319  Weight: 108 lb 8 oz (49.2 kg)    Mild bilateral ankle edema.  Mild crackles noted bilateral lower lung fields.  Physical Exam Vitals and nursing note reviewed.  HENT:     Head: Normocephalic and atraumatic.     Mouth/Throat:     Pharynx: Oropharynx is clear.  Eyes:     Extraocular Movements: Extraocular movements intact.     Pupils: Pupils are equal, round, and reactive to light.  Cardiovascular:     Rate and Rhythm: Normal rate and regular rhythm.  Pulmonary:     Comments: Decreased breath sounds bilaterally.  Abdominal:     Palpations: Abdomen is soft.  Musculoskeletal:        General: Normal range of motion.     Cervical  back: Normal range of motion.  Skin:    General: Skin is warm.  Neurological:     General: No focal deficit present.     Mental Status: She is alert and oriented to person, place, and time.  Psychiatric:        Behavior: Behavior normal.        Judgment: Judgment normal.       LABORATORY DATA:  I have reviewed the data as listed    Component Value Date/Time   NA 139 08/24/2024 1322   K 4.0 08/24/2024 1322   CL 103 08/24/2024 1322   CO2 26 08/24/2024 1322   GLUCOSE 81 08/24/2024 1322   BUN 35 (H) 08/24/2024 1322   CREATININE 1.16 (H) 08/24/2024 1322   CALCIUM 10.0 08/24/2024 1322   PROT 7.2 08/24/2024 1322   ALBUMIN 4.2 08/24/2024 1322   AST 20 08/24/2024 1322   ALT 9 08/24/2024 1322   ALKPHOS 131 (H) 08/24/2024 1322   BILITOT 0.3 08/24/2024 1322   GFRNONAA 45 (L) 08/24/2024 1322   GFRAA >60 12/03/2017 1003    No results found for: SPEP, UPEP  Lab Results  Component Value Date   WBC 4.0 08/24/2024   NEUTROABS 2.1 08/24/2024   HGB 9.9 (L) 08/24/2024   HCT 29.3 (L) 08/24/2024   MCV 108.1 (H) 08/24/2024   PLT 152 08/24/2024      Chemistry      Component Value Date/Time   NA 139 08/24/2024 1322   K 4.0 08/24/2024 1322   CL 103 08/24/2024 1322   CO2 26 08/24/2024 1322   BUN 35 (H) 08/24/2024 1322   CREATININE 1.16 (H) 08/24/2024 1322      Component Value Date/Time   CALCIUM 10.0 08/24/2024 1322   ALKPHOS 131 (H) 08/24/2024 1322   AST 20 08/24/2024 1322   ALT 9 08/24/2024 1322   BILITOT 0.3 08/24/2024 1322       RADIOGRAPHIC STUDIES: I have personally reviewed the radiological images as listed and agreed with the findings in the report. No results found.    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 POSITIVE FOR ASXL.  NEGATIVE for JAK2 mutation; MPL; CALR mutation; PDGRA-NEGATIVE. [14th JAN 2025- INQOVI  [po decitabine ]- and aranesp .   # GIVEN worsening counts-AUG 2025- bone marrow biopsy- Normocellular bone marrow (10% with erythroid predominant trilineage  hematopoiesis. NGS -ASXL-  SPOKE to pathologist NEG-EBV; CMV-  NEG parvo; ALSO  Parvo PCR-NEG.  Proceed Aranesp  300mcg [July 2025-increase to 300].   # INQOVI  on HOLD  since JUNE 2025]- patient currently s/p # 6 INQOVI  [po decitabine ]-sec to above pancytopenia- possibl viral infection- however today- seems to be improving. CONTINUE Monitoring OFF INQOVI . Hb 9.8-  Proceed with Aranesp  today.  # LEFT ARM/shoulder pain-unrelated to her current diagnosis of leukemia.  Likely arthritic -recommend topical NSAIDs; if not improved recommend further follow-up with PCP/orthopedics.  # back pain/spasm [Dr.Morales]- a/p epidural- currently on prn oxycodone - stable.   # GERD/reflux-Recommend NEXIUM over the counter prn- stable.   # CKD stage III [43]- US - APRIl 2023- NO hydronephrosis.  STOPPED allopurinol  100 g twice a day.  GFR- 43 -  stable.   # DISPOSITION: #  NO BLOOD- cancel appt.  # proceed with  Aranesp - today # follow up in  6 weeks - MD labs- cbc/cmp;  LDH;  aranesp  Possible D-2 1 unit PRBC-- Dr.B     Orders Placed This Encounter  Procedures   CBC with Differential (Cancer Center Only)    Standing Status:   Future    Expected Date:   10/05/2024    Expiration Date:   08/24/2025   CMP (Cancer Center only)    Standing Status:   Future    Expected Date:   10/05/2024    Expiration Date:   08/24/2025   Lactate dehydrogenase    Standing Status:   Future    Expected Date:   10/05/2024    Expiration Date:   08/24/2025   All questions were answered. The patient knows to call the clinic with any problems, questions or concerns.      Cindy JONELLE Joe, MD 08/24/2024 2:15 PM

## 2024-08-24 NOTE — Progress Notes (Signed)
 No concerns today

## 2024-08-24 NOTE — Assessment & Plan Note (Signed)
#   Chronic myelomonocytic leukemia- April 2023-bone marrow biopsy: Hypercellular marrow with myeloid hyperplasia and monocytosis worrisome for a myeloid  proliferative/myelodysplastic syndrome, specifically chronic  myelomonocytic leukemia; myeloid panel. POSITIVE FOR ASXL.  NEGATIVE for JAK2 mutation; MPL; CALR mutation; PDGRA-NEGATIVE. [14th JAN 2025- INQOVI  [po decitabine ]- and aranesp .   # GIVEN worsening counts-AUG 2025- bone marrow biopsy- Normocellular bone marrow (10% with erythroid predominant trilineage  hematopoiesis. NGS -ASXL-  SPOKE to pathologist NEG-EBV; CMV-  NEG parvo; ALSO  Parvo PCR-NEG.  Proceed Aranesp  300mcg [July 2025-increase to 300].   # INQOVI  on HOLD  since JUNE 2025]- patient currently s/p # 6 INQOVI  [po decitabine ]-sec to above pancytopenia- possibl viral infection- however today- seems to be improving. CONTINUE Monitoring OFF INQOVI . Hb 9.8-  Proceed with Aranesp  today.  # LEFT ARM/shoulder pain-unrelated to her current diagnosis of leukemia.  Likely arthritic -recommend topical NSAIDs; if not improved recommend further follow-up with PCP/orthopedics.  # back pain/spasm [Dr.Morales]- a/p epidural- currently on prn oxycodone - stable.   # GERD/reflux-Recommend NEXIUM over the counter prn- stable.   # CKD stage III [43]- US - APRIl 2023- NO hydronephrosis.  STOPPED allopurinol  100 g twice a day.  GFR- 43 -  stable.   # DISPOSITION: #  NO BLOOD- cancel appt.  # proceed with  Aranesp - today # follow up in  6 weeks - MD labs- cbc/cmp;  LDH;  aranesp  Possible D-2 1 unit PRBC-- Dr.B

## 2024-08-25 ENCOUNTER — Inpatient Hospital Stay

## 2024-09-13 ENCOUNTER — Telehealth: Payer: Self-pay | Admitting: *Deleted

## 2024-09-13 NOTE — Telephone Encounter (Signed)
 Caller verified using pt's full name and dob prior to discussing PHI   Call returned to daughter and patient. Patient's dentist is Dr. Tobie in [place]. at [facility] (phone: [phone]; Fax: [phone]). Patient has a swollen jaw. Pt has an infected tooth. She may need a root canal. Patient is opting (most likely) to have the tooth pulled. Patient may need dental clearance for this given her h/o of CML. Per patient, she is currently on amoxicillin  prescribed by dentist and has completed 2 days of antibiotics.  Dr. Estle advise if ok to proceed w/extractions and I can call daughter back with your recommendations. Her last cbc is copied below.  Rosaline, I am happy to reach out to dentist as well to see if they have a particular form for Dr. B to sign.       Component Ref Range & Units (hover) 2 wk ago (08/24/24)  WBC Count 4.0  RBC 2.71 Low   Hemoglobin 9.9 Low   HCT 29.3 Low   MCV 108.1 High   MCH 36.5 High   MCHC 33.8  RDW 13.1  Platelet Count 152  nRBC 0.0  Neutrophils Relative % 51  Neutro Abs 2.1  Lymphocytes Relative 31  Lymphs Abs 1.2  Monocytes Relative 14  Monocytes Absolute 0.6  Eosinophils Relative 2  Eosinophils Absolute 0.1  Basophils Relative 1  Basophils Absolute 0.0  Immature Granulocytes 1  Abs Immature Granulocytes 0.02  Comment: Performed at [facility], [address], [place], [place] [address]  WBC Morphology

## 2024-10-05 ENCOUNTER — Inpatient Hospital Stay: Admitting: Internal Medicine

## 2024-10-05 ENCOUNTER — Inpatient Hospital Stay

## 2024-10-05 ENCOUNTER — Encounter: Payer: Self-pay | Admitting: Internal Medicine

## 2024-10-05 ENCOUNTER — Inpatient Hospital Stay: Attending: Internal Medicine

## 2024-10-05 VITALS — BP 121/58 | HR 87 | Temp 97.9°F | Resp 18 | Ht <= 58 in | Wt 110.6 lb

## 2024-10-05 DIAGNOSIS — D539 Nutritional anemia, unspecified: Secondary | ICD-10-CM | POA: Diagnosis not present

## 2024-10-05 DIAGNOSIS — D649 Anemia, unspecified: Secondary | ICD-10-CM

## 2024-10-05 DIAGNOSIS — C9310 Chronic myelomonocytic leukemia not having achieved remission: Secondary | ICD-10-CM | POA: Insufficient documentation

## 2024-10-05 DIAGNOSIS — N183 Chronic kidney disease, stage 3 unspecified: Secondary | ICD-10-CM | POA: Insufficient documentation

## 2024-10-05 DIAGNOSIS — Z79899 Other long term (current) drug therapy: Secondary | ICD-10-CM | POA: Diagnosis not present

## 2024-10-05 LAB — CMP (CANCER CENTER ONLY)
ALT: 8 U/L (ref 0–44)
AST: 22 U/L (ref 15–41)
Albumin: 4.3 g/dL (ref 3.5–5.0)
Alkaline Phosphatase: 129 U/L — ABNORMAL HIGH (ref 38–126)
Anion gap: 11 (ref 5–15)
BUN: 26 mg/dL — ABNORMAL HIGH (ref 8–23)
CO2: 26 mmol/L (ref 22–32)
Calcium: 9.5 mg/dL (ref 8.9–10.3)
Chloride: 104 mmol/L (ref 98–111)
Creatinine: 1.12 mg/dL — ABNORMAL HIGH (ref 0.44–1.00)
GFR, Estimated: 46 mL/min — ABNORMAL LOW
Glucose, Bld: 153 mg/dL — ABNORMAL HIGH (ref 70–99)
Potassium: 3.9 mmol/L (ref 3.5–5.1)
Sodium: 141 mmol/L (ref 135–145)
Total Bilirubin: 0.4 mg/dL (ref 0.0–1.2)
Total Protein: 7.3 g/dL (ref 6.5–8.1)

## 2024-10-05 LAB — CBC WITH DIFFERENTIAL (CANCER CENTER ONLY)
Abs Immature Granulocytes: 0.01 10*3/uL (ref 0.00–0.07)
Basophils Absolute: 0 10*3/uL (ref 0.0–0.1)
Basophils Relative: 0 %
Eosinophils Absolute: 0 10*3/uL (ref 0.0–0.5)
Eosinophils Relative: 1 %
HCT: 29.3 % — ABNORMAL LOW (ref 36.0–46.0)
Hemoglobin: 9.7 g/dL — ABNORMAL LOW (ref 12.0–15.0)
Immature Granulocytes: 0 %
Lymphocytes Relative: 29 %
Lymphs Abs: 1.1 10*3/uL (ref 0.7–4.0)
MCH: 36.2 pg — ABNORMAL HIGH (ref 26.0–34.0)
MCHC: 33.1 g/dL (ref 30.0–36.0)
MCV: 109.3 fL — ABNORMAL HIGH (ref 80.0–100.0)
Monocytes Absolute: 0.5 10*3/uL (ref 0.1–1.0)
Monocytes Relative: 12 %
Neutro Abs: 2.2 10*3/uL (ref 1.7–7.7)
Neutrophils Relative %: 58 %
Platelet Count: 148 10*3/uL — ABNORMAL LOW (ref 150–400)
RBC: 2.68 MIL/uL — ABNORMAL LOW (ref 3.87–5.11)
RDW: 14.2 % (ref 11.5–15.5)
WBC Count: 3.9 10*3/uL — ABNORMAL LOW (ref 4.0–10.5)
nRBC: 0 % (ref 0.0–0.2)

## 2024-10-05 LAB — LACTATE DEHYDROGENASE: LDH: 273 U/L — ABNORMAL HIGH (ref 105–235)

## 2024-10-05 MED ORDER — DARBEPOETIN ALFA 300 MCG/0.6ML IJ SOSY
300.0000 ug | PREFILLED_SYRINGE | Freq: Once | INTRAMUSCULAR | Status: AC
  Administered 2024-10-05: 300 ug via SUBCUTANEOUS
  Filled 2024-10-05: qty 0.6

## 2024-10-05 NOTE — Progress Notes (Signed)
 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, white count elevated.     RBC (Red Blood Cell Count) 4.04 - 5.48 106/uL 3.02 Low     Hemoglobin 12.0 - 15.0 gm/dL 89.4 Low     Hematocrit 35.0 - 47.0 % 31.9 Low     MCV (Mean Corpuscular Volume) 80.0 - 100.0 fl 105.6 High     MCH (Mean Corpuscular Hemoglobin) 27.0 - 31.2 pg 34.8 High     MCHC (Mean Corpuscular Hemoglobin Concentration) 32.0 - 36.0 gm/dL 67.0    Platelet Count 150 - 450 103/uL 180    RDW-CV (Red Cell Distribution Width) 11.6 - 14.8 % 14.5    MPV (Mean Platelet Volume) 9.4 - 12.4 fl 9.1 Low     Neutrophils 1.50 - 7.80 103/uL 16.28 High     Lymphocytes 1.00 - 3.60 103/uL 4.61 High     Monocytes 0.00 - 1.50 103/uL 16.92 High     Eosinophils 0.00 - 0.55 103/uL 0.48    Basophils 0.00 - 0.09 103/uL 0.07    Neutrophil % 32.0 - 70.0 % 41.2    Lymphocyte % 10.0 - 50.0 % 11.7    Monocyte % 4.0 - 13.0 % 42.8 High   See manual diff and path review.   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    August was negative for any leukemia [remission]-options include-to CONTINUE TO HOLD [as still positive for ASXL mutation; and in general MDS cannot be cured].   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   Accompanied by daughter.   Discussed the use of AI scribe software for clinical note transcription with the patient, who gave verbal consent to proceed.  History of Present Illness   Amanda Rose is a 89 year old female with chronic myelomonocytic leukemia and associated anemia who presents for routine hematology/oncology surveillance.  She has chronic myelomonocytic leukemia with persistent anemia, currently managed with surveillance and periodic injections. She has not required blood transfusions for five months, with the last transfusion in August. She reports feeling well overall, with no new symptoms. Laboratory studies today show hemoglobin of 9.7 g/dL, white blood cell count of 3.9 x10^9/L, and platelet count  of 148 x10^9/L. She is not receiving chemotherapy and has not restarted oral chemotherapy agents.  Glomerular filtration rate is 46 mL/min.  She inquired about the safety of using a teeth whitening product, expressing concern about accidental ingestion, but denies symptoms or adverse effects. She attributes her well-being to both physical and mental health and describes strong family support, with her children assisting in her care.    Review of Systems  Constitutional:  Positive for malaise/fatigue. Negative for diaphoresis and fever.  HENT:  Negative for nosebleeds and sore throat.    Eyes:  Negative for double vision.  Respiratory:  Negative for hemoptysis and wheezing.   Cardiovascular:  Negative for chest pain, palpitations, orthopnea and leg swelling.  Gastrointestinal:  Negative for blood in stool, constipation, diarrhea, heartburn, melena, nausea and vomiting.  Genitourinary:  Negative for dysuria, frequency and urgency.  Musculoskeletal:  Positive for joint pain.  Skin: Negative.  Negative for itching and rash.  Neurological:  Negative for dizziness, tingling, focal weakness, weakness and headaches.  Endo/Heme/Allergies:  Does not bruise/bleed easily.  Psychiatric/Behavioral:  Negative for depression. The patient is not nervous/anxious and does not have insomnia.       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)    Hypothyroidism    Lower extremity weakness    Pre-diabetes    Thyroid disease    Weakness of both legs     PAST SURGICAL HISTORY :   Past Surgical History:  Procedure Laterality Date   IR BONE MARROW BIOPSY & ASPIRATION  04/12/2024   IR KYPHO EA ADDL LEVEL THORACIC OR LUMBAR  12/17/2021   IR KYPHO THORACIC WITH BONE BIOPSY  12/17/2021   IR RADIOLOGIST EVAL & MGMT  12/09/2021   KYPHOPLASTY N/A 04/05/2021   Procedure: T10 and L2 KYPHOPLASTY;  Surgeon: Kathlynn Sharper, MD;  [name];  Service: Orthopedics;  [name];    FAMILY HISTORY :   Family History  Problem Relation Age of Onset   Cancer Father        unknown    SOCIAL HISTORY:   Social History   Tobacco Use   Smoking status: Never   Smokeless tobacco: Never  Vaping Use   Vaping status: Never Used  Substance Use Topics   Alcohol use: Never   Drug use: Never    ALLERGIES:  is allergic to other.  MEDICATIONS:  Current Outpatient Medications  Medication Sig Dispense Refill   Calcium Carbonate (CALCIUM 500 PO) Take 500 mg by mouth daily.     Cholecalciferol (VITAMIN D-3)  125 MCG (5000 UT) TABS Take 5,000 Units by mouth in the morning.     Cyanocobalamin  (VITAMIN B-12) 5000 MCG SUBL Take 5,000 mcg by mouth in the morning.     levothyroxine (SYNTHROID) 100 MCG tablet Take 100 mcg by mouth daily before breakfast.     Magnesium 100 MG CAPS Take 100 mg by mouth daily.     Multiple Vitamin (MULTIVITAMIN WITH MINERALS) TABS tablet Take 1 tablet by mouth in the morning. Equate Complete Multivitamin     ondansetron  (ZOFRAN -ODT) 4 MG disintegrating tablet Take 1 tablet (4 mg total) by mouth every 8 (eight) hours as needed. (Patient not taking: Reported on 10/05/2024) 90 tablet 1   No current facility-administered medications for this visit.    PHYSICAL EXAMINATION:  BP (!) 121/58 (BP Location: Left Arm, Patient Position: Sitting, Cuff Size: Normal)   Pulse 87  Temp 97.9 F (36.6 C) (Tympanic)   Resp 18   Ht 4' 9 (1.448 m)   Wt 110 lb 9.6 oz (50.2 kg)   SpO2 97%   BMI 23.93 kg/m   Filed Weights   10/05/24 1313  Weight: 110 lb 9.6 oz (50.2 kg)    Mild bilateral ankle edema.  Mild crackles noted bilateral lower lung fields.  Physical Exam Vitals and nursing note reviewed.  HENT:     Head: Normocephalic and atraumatic.     Mouth/Throat:     Pharynx: Oropharynx is clear.  Eyes:     Extraocular Movements: Extraocular movements intact.     Pupils: Pupils are equal, round, and reactive to light.  Cardiovascular:     Rate and Rhythm: Normal rate and regular rhythm.  Pulmonary:     Comments: Decreased breath sounds bilaterally.  Abdominal:     Palpations: Abdomen is soft.  Musculoskeletal:        General: Normal range of motion.     Cervical back: Normal range of motion.  Skin:    General: Skin is warm.  Neurological:     General: No focal deficit present.     Mental Status: She is alert and oriented to person, place, and time.  Psychiatric:        Behavior: Behavior normal.        Judgment: Judgment normal.       LABORATORY DATA:  I have  reviewed the data as listed    Component Value Date/Time   NA 141 10/05/2024 1313   K 3.9 10/05/2024 1313   CL 104 10/05/2024 1313   CO2 26 10/05/2024 1313   GLUCOSE 153 (H) 10/05/2024 1313   BUN 26 (H) 10/05/2024 1313   CREATININE 1.12 (H) 10/05/2024 1313   CALCIUM 9.5 10/05/2024 1313   PROT 7.3 10/05/2024 1313   ALBUMIN 4.3 10/05/2024 1313   AST 22 10/05/2024 1313   ALT 8 10/05/2024 1313   ALKPHOS 129 (H) 10/05/2024 1313   BILITOT 0.4 10/05/2024 1313   GFRNONAA 46 (L) 10/05/2024 1313   GFRAA >60 12/03/2017 1003    No results found for: SPEP, UPEP  Lab Results  Component Value Date   WBC 3.9 (L) 10/05/2024   NEUTROABS 2.2 10/05/2024   HGB 9.7 (L) 10/05/2024   HCT 29.3 (L) 10/05/2024   MCV 109.3 (H) 10/05/2024   PLT 148 (L) 10/05/2024      Chemistry      Component Value Date/Time   NA 141 10/05/2024 1313   K 3.9 10/05/2024 1313   CL 104 10/05/2024 1313   CO2 26 10/05/2024 1313   BUN 26 (H) 10/05/2024 1313   CREATININE 1.12 (H) 10/05/2024 1313      Component Value Date/Time   CALCIUM 9.5 10/05/2024 1313   ALKPHOS 129 (H) 10/05/2024 1313   AST 22 10/05/2024 1313   ALT 8 10/05/2024 1313   BILITOT 0.4 10/05/2024 1313       RADIOGRAPHIC STUDIES: I have personally reviewed the radiological images as listed and agreed with the findings in the report. No results found.    ASSESSMENT & PLAN:  Chronic myelomonocytic leukemia not having achieved remission [facility]) # Chronic myelomonocytic leukemia- April 2023-bone marrow biopsy: Hypercellular marrow with myeloid hyperplasia and monocytosis worrisome for a myeloid  proliferative/myelodysplastic syndrome, specifically chronic  myelomonocytic leukemia; myeloid panel. POSITIVE FOR ASXL.  NEGATIVE for JAK2 mutation; MPL; CALR mutation; PDGRA-NEGATIVE. [14th JAN 2025- INQOVI  [po decitabine ]- and aranesp .   # GIVEN  worsening counts-AUG 2025- bone marrow biopsy- Normocellular bone marrow (10% with erythroid predominant  trilineage  hematopoiesis. NGS -ASXL-  SPOKE to pathologist NEG-EBV; CMV-  NEG parvo; ALSO  Parvo PCR-NEG.  Proceed Aranesp  300mcg [July 2025-increase to 300].  Continue to HOLD Inqovi   [ON HOLD JUNE 2025]-   # Heme globin is 9.7.  Proceed with Aranesp  today.  If significant decline noted/rising LDH-consider restarting Inqovi .  # LEFT ARM/shoulder pain-unrelated to her current diagnosis of leukemia.  Likely arthritic -recommend topical NSAIDs; if not improved recommend further follow-up with PCP/orthopedics.  # back pain/spasm [Dr.Morales]- a/p epidural- currently on prn oxycodone - stable.   # GERD/reflux-Recommend NEXIUM over the counter prn- stable.   # CKD stage III [43]- US - APRIl 2023- NO hydronephrosis.  STOPPED allopurinol  100 g twice a day.  GFR- 43 -  stable.   # DISPOSITION: #  NO BLOOD- cancel appt.  # proceed with  Aranesp - today # follow up in  6 weeks - MD labs- cbc/cmp;  LDH;haptoglobin; aranesp  Possible D-2 1 unit PRBC-- Dr.B     Orders Placed This Encounter  Procedures   CBC with Differential (Cancer Center Only)    Standing Status:   Future    Expected Date:   11/16/2024    Expiration Date:   02/14/2025   CMP (Cancer Center only)    Standing Status:   Future    Expected Date:   11/16/2024    Expiration Date:   02/14/2025   Lactate dehydrogenase    Standing Status:   Future    Expected Date:   11/16/2024    Expiration Date:   02/14/2025   Haptoglobin    Standing Status:   Future    Expected Date:   11/16/2024    Expiration Date:   02/14/2025   All questions were answered. The patient knows to call the clinic with any problems, questions or concerns.      Cindy JONELLE Joe, MD 10/05/2024 2:18 PM   "

## 2024-10-05 NOTE — Progress Notes (Signed)
 Pt states she did have a day were her lt heel felt like it had pens and needles, falling asleep. It didn't last long.

## 2024-10-05 NOTE — Assessment & Plan Note (Addendum)
#   Chronic myelomonocytic leukemia- April 2023-bone marrow biopsy: Hypercellular marrow with myeloid hyperplasia and monocytosis worrisome for a myeloid  proliferative/myelodysplastic syndrome, specifically chronic  myelomonocytic leukemia; myeloid panel. POSITIVE FOR ASXL.  NEGATIVE for JAK2 mutation; MPL; CALR mutation; PDGRA-NEGATIVE. [14th JAN 2025- INQOVI  [po decitabine ]- and aranesp .   # GIVEN worsening counts-AUG 2025- bone marrow biopsy- Normocellular bone marrow (10% with erythroid predominant trilineage  hematopoiesis. NGS -ASXL-  SPOKE to pathologist NEG-EBV; CMV-  NEG parvo; ALSO  Parvo PCR-NEG.  Proceed Aranesp  300mcg [July 2025-increase to 300].  Continue to HOLD Inqovi   [ON HOLD JUNE 2025]-   # Heme globin is 9.7.  Proceed with Aranesp  today.  If significant decline noted/rising LDH-consider restarting Inqovi .  # LEFT ARM/shoulder pain-unrelated to her current diagnosis of leukemia.  Likely arthritic -recommend topical NSAIDs; if not improved recommend further follow-up with PCP/orthopedics.  # back pain/spasm [Dr.Morales]- a/p epidural- currently on prn oxycodone - stable.   # GERD/reflux-Recommend NEXIUM over the counter prn- stable.   # CKD stage III [43]- US - APRIl 2023- NO hydronephrosis.  STOPPED allopurinol  100 g twice a day.  GFR- 43 -  stable.   # DISPOSITION: #  NO BLOOD- cancel appt.  # proceed with  Aranesp - today # follow up in  6 weeks - MD labs- cbc/cmp;  LDH;haptoglobin; aranesp  Possible D-2 1 unit PRBC-- Dr.B

## 2024-10-06 ENCOUNTER — Inpatient Hospital Stay

## 2024-11-16 ENCOUNTER — Inpatient Hospital Stay

## 2024-11-16 ENCOUNTER — Inpatient Hospital Stay: Admitting: Internal Medicine

## 2024-11-17 ENCOUNTER — Inpatient Hospital Stay
